# Patient Record
Sex: Female | Born: 1970
Health system: Southern US, Community
[De-identification: ages and names within clinical notes are randomized; demographics above are authoritative.]

## PROBLEM LIST (undated history)

## (undated) DIAGNOSIS — R51 Headache: Secondary | ICD-10-CM

## (undated) DIAGNOSIS — J302 Other seasonal allergic rhinitis: Secondary | ICD-10-CM

## (undated) DIAGNOSIS — K219 Gastro-esophageal reflux disease without esophagitis: Secondary | ICD-10-CM

## (undated) DIAGNOSIS — K649 Unspecified hemorrhoids: Secondary | ICD-10-CM

## (undated) DIAGNOSIS — T7840XA Allergy, unspecified, initial encounter: Secondary | ICD-10-CM

## (undated) DIAGNOSIS — E039 Hypothyroidism, unspecified: Secondary | ICD-10-CM

## (undated) DIAGNOSIS — N390 Urinary tract infection, site not specified: Secondary | ICD-10-CM

## (undated) DIAGNOSIS — E119 Type 2 diabetes mellitus without complications: Secondary | ICD-10-CM

## (undated) DIAGNOSIS — I10 Essential (primary) hypertension: Secondary | ICD-10-CM

## (undated) DIAGNOSIS — Z972 Presence of dental prosthetic device (complete) (partial): Secondary | ICD-10-CM

## (undated) HISTORY — DX: Allergy, unspecified, initial encounter: T78.40XA

## (undated) HISTORY — DX: Urinary tract infection, site not specified: N39.0

## (undated) HISTORY — PX: TONSILLECTOMY AND ADENOIDECTOMY: SUR1326

## (undated) HISTORY — DX: Type 2 diabetes mellitus without complications: E11.9

## (undated) HISTORY — PX: OTHER SURGICAL HISTORY: SHX169

---

## 2001-12-02 ENCOUNTER — Emergency Department (HOSPITAL_COMMUNITY): Admission: EM | Admit: 2001-12-02 | Discharge: 2001-12-02 | Payer: Self-pay | Admitting: Emergency Medicine

## 2001-12-05 ENCOUNTER — Encounter: Payer: Self-pay | Admitting: Family Medicine

## 2001-12-05 ENCOUNTER — Encounter: Admission: RE | Admit: 2001-12-05 | Discharge: 2001-12-05 | Payer: Self-pay | Admitting: Family Medicine

## 2001-12-18 ENCOUNTER — Encounter (INDEPENDENT_AMBULATORY_CARE_PROVIDER_SITE_OTHER): Payer: Self-pay

## 2001-12-18 ENCOUNTER — Inpatient Hospital Stay (HOSPITAL_COMMUNITY): Admission: RE | Admit: 2001-12-18 | Discharge: 2001-12-20 | Payer: Self-pay | Admitting: Obstetrics & Gynecology

## 2001-12-18 HISTORY — PX: MYOMECTOMY: SHX85

## 2002-12-31 ENCOUNTER — Ambulatory Visit (HOSPITAL_COMMUNITY): Admission: RE | Admit: 2002-12-31 | Discharge: 2002-12-31 | Payer: Self-pay | Admitting: *Deleted

## 2003-02-24 ENCOUNTER — Ambulatory Visit (HOSPITAL_COMMUNITY): Admission: RE | Admit: 2003-02-24 | Discharge: 2003-02-24 | Payer: Self-pay | Admitting: *Deleted

## 2003-03-02 ENCOUNTER — Ambulatory Visit (HOSPITAL_BASED_OUTPATIENT_CLINIC_OR_DEPARTMENT_OTHER): Admission: RE | Admit: 2003-03-02 | Discharge: 2003-03-02 | Payer: Self-pay | Admitting: Ophthalmology

## 2003-07-08 ENCOUNTER — Inpatient Hospital Stay (HOSPITAL_COMMUNITY): Admission: AD | Admit: 2003-07-08 | Discharge: 2003-07-11 | Payer: Self-pay | Admitting: Family Medicine

## 2003-07-08 ENCOUNTER — Encounter (INDEPENDENT_AMBULATORY_CARE_PROVIDER_SITE_OTHER): Payer: Self-pay

## 2003-07-08 HISTORY — PX: TUBAL LIGATION: SHX77

## 2003-08-20 ENCOUNTER — Encounter: Admission: RE | Admit: 2003-08-20 | Discharge: 2003-08-20 | Payer: Self-pay | Admitting: Obstetrics and Gynecology

## 2003-09-10 ENCOUNTER — Encounter: Admission: RE | Admit: 2003-09-10 | Discharge: 2003-09-10 | Payer: Self-pay | Admitting: Obstetrics and Gynecology

## 2004-01-24 ENCOUNTER — Emergency Department (HOSPITAL_COMMUNITY): Admission: AD | Admit: 2004-01-24 | Discharge: 2004-01-24 | Payer: Self-pay | Admitting: Family Medicine

## 2004-01-31 ENCOUNTER — Emergency Department (HOSPITAL_COMMUNITY): Admission: AD | Admit: 2004-01-31 | Discharge: 2004-01-31 | Payer: Self-pay | Admitting: Family Medicine

## 2004-02-06 ENCOUNTER — Emergency Department (HOSPITAL_COMMUNITY): Admission: AD | Admit: 2004-02-06 | Discharge: 2004-02-06 | Payer: Self-pay | Admitting: Internal Medicine

## 2005-02-07 HISTORY — PX: NASAL TURBINATE REDUCTION: SHX2072

## 2005-02-17 ENCOUNTER — Encounter (INDEPENDENT_AMBULATORY_CARE_PROVIDER_SITE_OTHER): Payer: Self-pay | Admitting: Specialist

## 2005-02-17 ENCOUNTER — Ambulatory Visit (HOSPITAL_BASED_OUTPATIENT_CLINIC_OR_DEPARTMENT_OTHER): Admission: RE | Admit: 2005-02-17 | Discharge: 2005-02-17 | Payer: Self-pay | Admitting: *Deleted

## 2005-02-17 ENCOUNTER — Ambulatory Visit (HOSPITAL_COMMUNITY): Admission: RE | Admit: 2005-02-17 | Discharge: 2005-02-17 | Payer: Self-pay | Admitting: *Deleted

## 2005-02-17 HISTORY — PX: ADENOIDECTOMY: SUR15

## 2005-03-22 ENCOUNTER — Other Ambulatory Visit: Admission: RE | Admit: 2005-03-22 | Discharge: 2005-03-22 | Payer: Self-pay | Admitting: Family Medicine

## 2005-04-01 ENCOUNTER — Emergency Department (HOSPITAL_COMMUNITY): Admission: EM | Admit: 2005-04-01 | Discharge: 2005-04-01 | Payer: Self-pay | Admitting: Emergency Medicine

## 2005-07-22 ENCOUNTER — Emergency Department (HOSPITAL_COMMUNITY): Admission: EM | Admit: 2005-07-22 | Discharge: 2005-07-22 | Payer: Self-pay | Admitting: Family Medicine

## 2005-08-11 ENCOUNTER — Encounter: Admission: RE | Admit: 2005-08-11 | Discharge: 2005-08-11 | Payer: Self-pay | Admitting: Gastroenterology

## 2005-11-29 ENCOUNTER — Emergency Department (HOSPITAL_COMMUNITY): Admission: EM | Admit: 2005-11-29 | Discharge: 2005-11-29 | Payer: Self-pay | Admitting: Emergency Medicine

## 2006-01-29 ENCOUNTER — Encounter: Admission: RE | Admit: 2006-01-29 | Discharge: 2006-04-29 | Payer: Self-pay | Admitting: Occupational Medicine

## 2006-09-15 ENCOUNTER — Emergency Department (HOSPITAL_COMMUNITY): Admission: EM | Admit: 2006-09-15 | Discharge: 2006-09-15 | Payer: Self-pay | Admitting: Family Medicine

## 2006-09-16 ENCOUNTER — Inpatient Hospital Stay (HOSPITAL_COMMUNITY): Admission: AD | Admit: 2006-09-16 | Discharge: 2006-09-16 | Payer: Self-pay | Admitting: Family Medicine

## 2006-10-29 ENCOUNTER — Ambulatory Visit (HOSPITAL_COMMUNITY): Admission: RE | Admit: 2006-10-29 | Discharge: 2006-10-30 | Payer: Self-pay | Admitting: Obstetrics & Gynecology

## 2006-10-29 ENCOUNTER — Encounter (INDEPENDENT_AMBULATORY_CARE_PROVIDER_SITE_OTHER): Payer: Self-pay | Admitting: *Deleted

## 2006-10-29 HISTORY — PX: LAPAROSCOPIC TOTAL HYSTERECTOMY: SUR800

## 2006-11-03 ENCOUNTER — Emergency Department (HOSPITAL_COMMUNITY): Admission: EM | Admit: 2006-11-03 | Discharge: 2006-11-03 | Payer: Self-pay | Admitting: Emergency Medicine

## 2007-05-31 ENCOUNTER — Emergency Department (HOSPITAL_COMMUNITY): Admission: EM | Admit: 2007-05-31 | Discharge: 2007-05-31 | Payer: Self-pay | Admitting: Family Medicine

## 2008-02-09 ENCOUNTER — Emergency Department (HOSPITAL_COMMUNITY): Admission: EM | Admit: 2008-02-09 | Discharge: 2008-02-09 | Payer: Self-pay | Admitting: Family Medicine

## 2008-10-10 ENCOUNTER — Emergency Department (HOSPITAL_COMMUNITY): Admission: EM | Admit: 2008-10-10 | Discharge: 2008-10-10 | Payer: Self-pay | Admitting: Emergency Medicine

## 2008-10-19 ENCOUNTER — Emergency Department (HOSPITAL_COMMUNITY): Admission: EM | Admit: 2008-10-19 | Discharge: 2008-10-19 | Payer: Self-pay | Admitting: Emergency Medicine

## 2009-08-24 ENCOUNTER — Emergency Department (HOSPITAL_BASED_OUTPATIENT_CLINIC_OR_DEPARTMENT_OTHER): Admission: EM | Admit: 2009-08-24 | Discharge: 2009-08-25 | Payer: Self-pay | Admitting: Emergency Medicine

## 2010-11-12 ENCOUNTER — Encounter: Payer: Self-pay | Admitting: Gastroenterology

## 2011-03-10 NOTE — Op Note (Signed)
[facility]  Patient:    Melanie Roberts, Melanie Roberts Visit Number: [id number] MRN: [id number]          Service Type: GYN Location: 9300 [id number] Attending Physician:  Genia Del Dictated by:   Genia Del, M.D. Proc. Date: 12/18/01 Admit Date:  12/18/2001                             Operative Report  DATE OF BIRTH:                May 05, 1971  PREOPERATIVE DIAGNOSES:       1. Symptomatic uterine myomas with left leg                                  numbness and pain.                               2. Menorrhagia.  POSTOPERATIVE DIAGNOSES:      1. Symptomatic uterine myomas with left leg                                  numbness and pain.                               2. Menorrhagia.  INTERVENTION:                 Myomectomy x 4.  SURGEON:                      Genia Del, M.D.  ASSISTANT:                    Lenoard Aden, M.D.  ANESTHESIOLOGIST:             Ellison Hughs., M.D.  DESCRIPTION OF PROCEDURE:     Under general anesthesia with endotracheal intubation with the patient in the lithotomy position, she was prepped with Betadine on the abdominal, suprapubic, vulvar and vaginal areas.  Vaginal exam revealed an anteverted uterus, irregular, with a large left, low uterine myoma measuring about 5-6 cm.  No adnexal mass.  Normal cervix.  A speculum was inserted.  A tenaculum was applied on the anterior lip of the cervix.  The uterus was cannulated and a syringe was joined to the catheter in order to be able to inject methylene blue, if necessary.  A bladder catheter was inserted. The patient was then repositioned in the dorsal decubitus position.  The patient was draped as usual.  A dose of Ancef, 1 g IV, was given.  A Pfannenstiel incision was made with a scalpel.  We then opened the aponeurosis transversely with electrocautery and Mayo scissors.  We then separated the rectus muscles from the aponeurosis in the  midline superiorly and inferiorly. The parietal peritoneum was opened longitudinally with Metzenbaum scissors. he bladder was retracted downward.  We then explored the abdominal cavity. The liver, kidneys and periaortic areas were normal.  No pathology was felt in the abdominal cavity.  In the pelvic cavity, the uterus was irregular, with four myomas noted.  The largest one originated in the junction between the lower uterine segment and the  cervix on the left lateral side, with a wide-based pedicle.  The total size of the irregular fibroid was about 6 cm. It occupied the lower aspect of the broad ligament and extended close to the bladder anteriorly.  The second largest myoma then was an intramural myoma on the anterior aspect of the uterus measuring about 2.5 cm.  Then, two small subserosal myomas were present in the fundal area of the uterus, one anteriorly and one posteriorly, both measuring about 1 cm in diameter.  Both tubes and ovaries were normal in appearance and size.  No other pelvic pathology was present.  A Balfour retractor was placed with a bladder blade. We then retracted the bowels upwards with three laps.  The left and right ureters were well visualized in normal anatomic position.  The left round ligament was doubly sutured with 0 Vicryl.  It was then sectioned in between with electrocautery.  The peritoneum was opened anteriorly and the bladder was retracted downward.  That maneuver exposed the left, wide-based, pedunculated myoma, measuring about 6 cm.  We used towel clamps to grasp the myoma and proceeded with dissection of the myoma with electrocautery and Vannas scissors.  We were situated anterior to the uterine blood vessels and superior to the ureter.  After dissecting the myoma completely and excising it partially to improve visualization, we obtained a reasonable-sized pedicle about 2 cm in diameter.  This was safely clamped with a curved Heaney very lose to the  myoma after assuring that the ureter and the uterine vessels were not included.  We then sectioned with Mayo scissors.  The remainder of the uterine myoma was sent for pathology.  We then sutured with a transfixion suture of 0 Vicryl.  Two X stitches were then necessary to complete hemostasis at the level of the bed of the uterine myoma.  This was done with 0 Vicryl. Hemostasis was adequate at that level.  We then continued with a second myomectomy for the intramural anterior myoma.  Pitressin was injected, 20 and 30, 10 cc.  We then opened the serosa and the myometrium with a vertical incision just above the myoma.  The myoma was dissected easily and a running locked suture was done with 0 Vicryl including the myometrium and the serosa. The incision was less than 3 cm in length and was shallow, not reaching the endometrium.  We then proceeded with the last two myomectomies at the level of the subserosal myomas anteriorly and posteriorly at the fundus.  Injection of Pitressin 1 cc at each location was done.  Electrocautery was used to open the serosa and reach the myomas.  The myomas were removed easily and 0 Vicryl was used in a locked suture to control hemostasis at that level.  After carefully verifying hemostasis at the level of the bladder flap and the previous site of the left pedunculated myoma, the peritoneum was closed with a running suture of 2-0 Vicryl.  The round ligament was then sutured back together with 0 Vicryl.  Irrigation and suction of the pelvic cavity was done.  Surgicel was applied to the anterior uterine incision.  Hemostasis was adequate at all levels.  The laps were removed as well as the Balfour retractor.  Hemostasis was completed at the level of the rectus muscles and aponeurosis with electrocautery.  The aponeurosis was closed with two half running sutures of 0 Vicryl.  Hemostasis was completed at the level of the adipose tissue with electrocautery.  The  subcutaneous tissue was  infiltrated with 0.25% Marcaine plain, 17 cc.  The skin was reapproximated with staples and a dry dressing was applied.  Sponge, needle and instrument counts were complete x 2.  Estimated  blood loss was 100 cc.  No complications occurred.  The patient was transferred to the recovery room in good status. Dictated by:   Genia Del, M.D. Attending Physician:  Genia Del DD:  12/18/01 TD:  12/18/01 Job: [id number]

## 2011-03-10 NOTE — Discharge Summary (Signed)
   NAME:  Melanie Roberts, Melanie Roberts                        ACCOUNT NO.:  [id number]   MEDICAL RECORD NO.:  [id number]                   PATIENT TYPE:  INP   LOCATION:  9134                                 FACILITY:  [facility]   PHYSICIAN:  Maurice March, M.D.                  DATE OF BIRTH:  01/19/1971   DATE OF ADMISSION:  07/08/2003  DATE OF DISCHARGE:  07/11/2003                                 DISCHARGE SUMMARY   ADMISSION DIAGNOSIS:  A 41 year old G3, P2-0-0-2 at term for scheduled low  transverse caesarian section secondary to history of myomectomies.   DISCHARGE DIAGNOSES:  53. A 40 year old G3, P3 postoperative day #3 status low transverse caesarian     section.  2. Viable female with Apgar's nine at 1 minute, nine at 5 minutes.  3. Bilateral tubal ligation.   DISCHARGE MEDICATIONS:  1. Ibuprofen 600 mg p.o. q.6h. p.r.n.  2. Percocet 5/325 mg 1-2 p.o. q.4-6h. p.r.n.  3. Prenatal vitamins 1 p.o. daily.  4. Iron sulfate 325 mg daily x6 weeks.   ADMISSION HISTORY:  Melanie Roberts was a 40 year old G3, P2 who presented at  term for a scheduled cesarean section.  She was taken to the operating room  by Dr. Okey Dupre.  Please see that dictation.  A bilateral tubal ligation was  also performed.   The patient had a routine postoperative course.  Her postoperative  hemoglobin was noted to be 8.7 and she was placed on iron for six weeks.  She is planning to bottle feed her infant.  He is to be circumcised prior to  discharge.   INSTRUCTIONS GIVEN TO PATIENT:  The patient was told of her medical regimen.  She was to follow up at [facility] in six weeks.   CONDITION ON DISCHARGE:  The patient was discharged to home in stable  condition.                                               Maurice March, M.D.    LC/MEDQ  D:  07/11/2003  T:  07/11/2003  Job:  [id number]

## 2011-03-10 NOTE — Discharge Summary (Signed)
[facility]  Patient:    Melanie Roberts, Melanie Roberts Visit Number: [id number] MRN: [id number]          Service Type: GYN Location: 9300 [id number] 01 Attending Physician:  Genia Del Dictated by:   Genia Del, M.D. Admit Date:  12/18/2001 Discharge Date: 12/20/2001                             Discharge Summary  ADMISSION DIAGNOSIS:          Symptomatic uterine myomas with left leg numbness and pain and menorrhagia.  DISCHARGE DIAGNOSIS:          Symptomatic uterine myomas with left leg numbness and pain and menorrhagia.  INTERVENTION:                 Myomectomies by laparotomy.  HOSPITAL COURSE:              The patient is a 40 year old woman.  She is G3, P2, A1 desiring to preserve her fertility potential as much as possible.  She had a pelvic ultrasound showing four uterine myomas with one pediculated left inferior myoma, probably responsible for her left leg numbness and pain.  She also had problems with menorrhagia but no anemia with a hemoglobin preop at 12.6.  The patient was on hydrocodone and ibuprofen for the above-mentioned pains.  She has no known drug allergies.  No past medical or surgical history. Her physical exam was within normal limits with stable vital signs.  Her uterus was anteverted, irregular, increased in volume corresponding to 10-12 cm with a left low myoma or mass measuring 5-6 cm.  The patient was brought to the OR on December 18, 2001.  Four myomectomies were performed. The largest myoma was very low on the left aspect of the uterus, coming off of the junction between the uterus and cervix.  Three other myomas were removed. One was anterior intramural and two were subserosal at the anterior and posterior fundus.  Pitressin was used to decrease blood loss.  Hemostasis was good at the end of the intervention.  Estimated blood loss was 100 cc.  No complications occurred.  The postop course was unremarkable.  The  patient remained hemodynamically stable and afebrile.  Her hemoglobin postop was 11. She was discharged on postop day #2.  Postop advice was given.  She was prescribed Tylox and ibuprofen p.r.n.  She will follow up at [facility] in 3-4 weeks for a postop evaluation. Dictated by:   Genia Del, M.D. Attending Physician:  Genia Del DD:  01/02/02 TD:  01/04/02 Job: [id number]

## 2011-03-10 NOTE — Op Note (Signed)
NAME:  Melanie Roberts, Melanie Roberts                        ACCOUNT NO.:  [id number]   MEDICAL RECORD NO.:  [id number]                   PATIENT TYPE:  INP   LOCATION:  9134                                 FACILITY:  [facility]   PHYSICIAN:  Phil D. Okey Dupre, M.D.                  DATE OF BIRTH:  1971-07-01   DATE OF PROCEDURE:  07/08/2003  DATE OF DISCHARGE:                                 OPERATIVE REPORT   PREOPERATIVE DIAGNOSES:  1. Term pregnancy post multiple myomectomy for uterine fibroids.  2. Voluntary sterilization.   POSTOPERATIVE DIAGNOSES:  1. Term pregnancy post multiple myomectomy for uterine fibroids.  2. Voluntary sterilization.   PROCEDURES:  1. Low transverse cesarean section.  2. Modified Pomeroy bilateral tubal ligation.   ANESTHESIA:  Spinal.   ESTIMATED BLOOD LOSS:  700 mL.   SURGEON:  Javier Glazier. Okey Dupre, M.D.   POSTOPERATIVE CONDITION:  Satisfactory.   OPERATIVE FINDINGS:  A female infant, 7 pounds 4 ounces, with Apgar 9 and 9.   The procedure went as follows:  Under satisfactory spinal anesthesia with  the patient in the dorsal supine position, a Foley catheter having been  placed in the urinary bladder, the abdomen was prepped and draped in the  usual sterile manner.  There was a large transverse keloid scar from a  previous myomectomy that was removed on entry and the low transverse  incision was extended to a total length of 20 cm with it being situated 3 cm  above the symphysis.  From that point on the abdomen was entered by layers  and on entering the peritoneal cavity, the visceral peritoneum and the  anterior surface of the uterus was opened transversely by sharp dissection,  the bladder pushed away from the lower uterine segment, which was entered by  sharp and blunt dissection, and from an LOT presentation using a Simpson  forceps, the baby was easily delivered.  The cord doubly clamped and  divided, the baby handed to the pediatrician.  It was a female infant  weighing  7 pounds 4 ounces, and 9 and 9 Apgar.  Specimens of blood were taken for  analysis, the spontaneous, removed, and the uterus explored.  The uterus was  then closed with continuous running locked 0 Vicryl suture on an atraumatic  needle.  Once this was done there was one area of oozing along the suture  line, which was controlled with the same suture of a figure-of-eight single  suture.  At this point the tubes were identified, grasped with Babcock  clamp, an opening made through an avascular portion of the mesosalpinx  beneath the tube using a hemostat and one plain suture brought through this  opening and tied around the distal and proximal ends of the tube, forming a  loop of tube approximately 2 cm above the tie.  The second tie was placed  before the aforementioned tie, and this  was tied and cut short and the  section of tube above the tie was excised with a Metzenbaum scissors.  The  exposed portions of the tube after the dissection were coagulated with hot  cautery.  The area was then re-observed for bleeding, none was noted, and  the fascia was closed with continuous alternating locked 0 Vicryl suture  from either end of the incision, meeting in the midline.  Hot cautery was  used for subcutaneous bleeding control and skin staples used for skin edge  approximation.  A dry sterile dressing was applied.  The patient tolerated  the procedure well.  Tape, instrument, sponge, and needle count were  reported correct at the end of the procedure and the Foley catheter was  draining clear amber urine.  The placenta was sent for pathologic diagnosis.                                               Phil D. Okey Dupre, M.D.    PDR/MEDQ  D:  07/08/2003  T:  07/08/2003  Job:  [id number]

## 2011-03-10 NOTE — Op Note (Signed)
NAME:  Melanie Roberts, Melanie Roberts      ACCOUNT NO.:  [id number]   MEDICAL RECORD NO.:  [id number]          PATIENT TYPE:  AMB   LOCATION:  DAY                          FACILITY:  [facility]   PHYSICIAN:  Genia Del, M.D.DATE OF BIRTH:  02/20/1971   DATE OF PROCEDURE:  10/29/2006  DATE OF DISCHARGE:                               OPERATIVE REPORT   PREOPERATIVE DIAGNOSIS:  Myomas with menorrhagia.   POSTOPERATIVE DIAGNOSIS:  Myomas with menorrhagia.   PROCEDURE:  Total laparoscopic hysterectomy, assisted with Da vinci  robot.   SURGEON:  Dr. Genia Del.   ASSISTANT:  Dr. Richardean Sale.   ANESTHESIOLOGIST:  Jill Side, M.D.   PROCEDURE:  Under general anesthesia with endotracheal intubation the  patient is in lithotomy position.  She is prepped with Betadine on the  abdominal, suprapubic, vulvar and vaginal areas and draped as usual.  The vaginal exam reveals an anteverted uterus, irregular, about 12 cm,  mobile, no adnexal mass.  The speculum is introduced in the vagina.  The  anterior lip of the cervix was grasped with a tenaculum, hysterometry is  at 12 cm.  We therefore used a #10 Rumi.  We put the Rumi with the [facility]  ring in place.  We attached the Koh ring to the cervix with a Vicryl 0.  The speculum was removed.  The bladder catheter is inserted.  We then  make the measurements.  The camera port is put at 18 cm from the  symphysis pubis about 8-10 cm above the fundus of the uterus.  We marked  the skin there.  We infiltrate the subcutaneous tissue with Marcaine one-  quarter plain.  We made a 1.5 cm incision with a scalpel at that level.  We opened the aponeurosis with Mayo scissors under direct vision and we  opened the parietal peritoneum with Met scissors under direct vision.  A  pursestring suture of Vicryl 0 is put on the aponeurosis.  We then  insert the Hasson and the camera at that level.  We create a  pneumoperitoneum.  We visualized the  intra-abdominal and pelvic  cavities.  The abdominal cavity appears within normal limits.  The  pelvic cavity presents an enlarged uterus with multiple myomas.  Both  ovaries are normal in appearance and volume.  No other lesion is seen in  the pelvis.  We then measure all other ports and put three robotic ports  and one assistant port in the usual fashion.  We then docked the robot  and inserted the instruments.  A fenestrated bipolar is used on the left  hand and Endo shears scissors used on the right hand and in the fourth  arm the Cobra clamp is used to we then start robotic time.  We cauterize  and section successively the left tube, left utero-ovarian ligament and  then the left round ligament and descent towards the left uterine  artery.  A myoma is present on the lateral border of the uterus that  makes the surgery more difficult but no problem occurs.  We proceed the  same way on the right side.  There too a lateral fibroid  is present.  We  then opened the visceral peritoneum over the lower uterine segment with  the Endo shear scissors.  The bladder is very adherent to the lower  uterine segment because of previous C-section.  We very carefully  dissect that area to lower the bladder.  The bladder catheter is clamped  twice to fill up the bladder to see the border of it more definitely.  We successfully reclined the bladder.  We then cauterized and section  the uterine artery on both sides and finally open the vaginal vault with  the Endo shears scissor point on the Koh ring.  The uterus is completely  freed that way and it is removed vaginally and sent to pathology.  We  then inflated the at the vaginal obturator to preserve the  pneumoperitoneum and close the vaginal vault with Vicryl 0-0 in figure-  of-eight stitches.  We then irrigate and suction the pelvic cavity.  Hemostasis is adequate at all levels.  We then removed all instruments.  The robot is undocked and the ports are  removed.  At the supraumbilical  incision, we attached the Vicryl zero on the aponeurosis.  We then make  a subcuticular stitch of Vicryl 4-0.  We closed the skin with a Vicryl 4-  0 at the assistant port as well and we closed the three other robotic  incisions with Dermabond on the skin.  The obturator is removed  vaginally.  The count of instruments and sponges was complete. The  estimated blood loss was 125 mL.  The patient received Ancef 4 grams IV  at the beginning of the surgery.  She was brought to recovery room in  good stable status.      Genia Del, M.D.  Electronically Signed     ML/MEDQ  D:  10/29/2006  T:  10/29/2006  Job:  [id number]

## 2011-03-10 NOTE — Op Note (Signed)
NAMEMAYSON, STERBENZ NO.:  [id number]   MEDICAL RECORD NO.:  [id number]          PATIENT TYPE:  AMB   LOCATION:  DSC                          FACILITY:  [facility]   PHYSICIAN:  Kathy Breach, M.D.      DATE OF BIRTH:  1971-01-13   DATE OF PROCEDURE:  02/17/2005  DATE OF DISCHARGE:                                 OPERATIVE REPORT   PREOPERATIVE DIAGNOSES:  1.  Obstructive hyperplastic adenoids.  2.  Bilateral hyperplasia inferior nasal turbinates.   POSTOPERATIVE DIAGNOSES:  1.  Obstructive hyperplastic adenoids.  2.  Bilateral hyperplasia inferior nasal turbinates.   PROCEDURES:  1.  Adenoidectomy.  2.  Bilateral submucous resection inferior turbinates.   DESCRIPTION OF PROCEDURE:  With patient under general orotracheal  anesthesia, Crowe-Davis mouth gag inserted and patient put in rose position.  Soft palate was intact with normal configuration.  Tonsils were surgically  absent.  Red rubber catheter was passed through the left nasal chamber and  used to elevate the soft palate.  Mirror visualization revealed large fronds  of adenoid tissue at and into the posterior choanae bilaterally. Evidence of  previous adenoidectomy in childhood, removed remaining adenoid tissue more  inferiorly.  Under mirror visualization with suction cautery with the  adenoid tissue out of reach of curettes or adenotomes, under mirror  visualization with suction cautery, complete ablation of remaining adenoid  tissue with removal was completed.  Blood loss was minimal with removal in  this fashion.   Patient then repositioned.  Nasal block anesthesia applied with 4% Xylocaine  with ephedrine solution on olive-tipped probes to the sphenopalatine.  Anterior ethmoid __________ bilaterally.  Cotton pledgets soaked with  similar solution inserted along both inferior turbinates and inferior  turbinates were infiltrated with 1% Xylocaine with 1:100,000 epinephrine for  vasoconstrictive  effort.  Nasal inspection before block applied hyperplastic  turbinates right greater than left nearly obstructing inferior nasal areas.  The left turbinate shrunk down fairly nicely with decongesting the right one  minimally.  Stab incisions made over the anterior aspect of the left  inferior turbinate.  Superiorly based septal surface mucosa was elevated  submucosally off the turbinate bone.  Lower half of the turbinate bone with  attached inferior meatal mucosa was excised with angled scissors.  With  suction cautery, complete hemostasis along the bony mucosal resection lines  was completed as well as reduction of the posterior extension of the  turbinate.  Remaining turbinate bone was gently out-fractured.  Similar  procedure completed on the right side.  Blood loss was insignificant  throughout the entire combined procedures.  The patient tolerated the  procedure well and was taken to the recovery room in stable general  condition.    JGL/MEDQ  D:  02/17/2005  T:  02/17/2005  Job:  [id number]

## 2011-05-31 ENCOUNTER — Emergency Department (HOSPITAL_COMMUNITY): Payer: Federal, State, Local not specified - PPO

## 2011-05-31 ENCOUNTER — Emergency Department (HOSPITAL_COMMUNITY)
Admission: EM | Admit: 2011-05-31 | Discharge: 2011-06-01 | Disposition: A | Discharge status: Home / Self Care | Payer: Federal, State, Local not specified - PPO | Attending: Emergency Medicine | Admitting: Emergency Medicine

## 2011-05-31 ENCOUNTER — Inpatient Hospital Stay (INDEPENDENT_AMBULATORY_CARE_PROVIDER_SITE_OTHER)
Admission: RE | Admit: 2011-05-31 | Discharge: 2011-05-31 | Disposition: A | Discharge status: Home / Self Care | Payer: Federal, State, Local not specified - PPO | Source: Ambulatory Visit | Attending: Family Medicine | Admitting: Family Medicine

## 2011-05-31 DIAGNOSIS — E039 Hypothyroidism, unspecified: Secondary | ICD-10-CM | POA: Insufficient documentation

## 2011-05-31 DIAGNOSIS — G510 Bell's palsy: Secondary | ICD-10-CM

## 2011-05-31 DIAGNOSIS — R209 Unspecified disturbances of skin sensation: Secondary | ICD-10-CM | POA: Insufficient documentation

## 2011-05-31 DIAGNOSIS — Z79899 Other long term (current) drug therapy: Secondary | ICD-10-CM | POA: Insufficient documentation

## 2011-05-31 DIAGNOSIS — H109 Unspecified conjunctivitis: Secondary | ICD-10-CM | POA: Insufficient documentation

## 2011-05-31 LAB — DIFFERENTIAL
Eosinophils Absolute: 0.1 10*3/uL (ref 0.0–0.7)
Eosinophils Relative: 3 % (ref 0–5)
Monocytes Absolute: 0.3 10*3/uL (ref 0.1–1.0)
Neutrophils Relative %: 43 % (ref 43–77)

## 2011-05-31 LAB — BASIC METABOLIC PANEL
BUN: 10 mg/dL (ref 6–23)
CO2: 26 mEq/L (ref 19–32)
Chloride: 102 mEq/L (ref 96–112)
Creatinine, Ser: 0.58 mg/dL (ref 0.50–1.10)
GFR calc non Af Amer: >60 mL/min (ref >60)
Potassium: 3.6 mEq/L (ref 3.5–5.1)
Sodium: 138 mEq/L (ref 135–145)

## 2011-05-31 LAB — CBC
HCT: 36.1 % (ref 36.0–46.0)
Hemoglobin: 12.3 g/dL (ref 12.0–15.0)
MCHC: 34.1 g/dL (ref 30.0–36.0)
Platelets: 296 10*3/uL (ref 150–400)

## 2011-07-28 LAB — URINALYSIS, ROUTINE W REFLEX MICROSCOPIC
Bilirubin Urine: NEGATIVE
Glucose, UA: NEGATIVE mg/dL
Hgb urine dipstick: NEGATIVE
Ketones, ur: NEGATIVE mg/dL
Nitrite: NEGATIVE
Protein, ur: NEGATIVE mg/dL
Specific Gravity, Urine: 1.016 (ref 1.005–1.030)

## 2011-07-28 LAB — CBC
HCT: 36.3 % (ref 36.0–46.0)
Hemoglobin: 12 g/dL (ref 12.0–15.0)
MCHC: 33.2 g/dL (ref 30.0–36.0)
MCV: 88 fL (ref 78.0–100.0)
Platelets: 260 10*3/uL (ref 150–400)
RBC: 4.12 MIL/uL (ref 3.87–5.11)
RDW: 14.3 % (ref 11.5–15.5)
WBC: 5.4 10*3/uL (ref 4.0–10.5)

## 2011-07-28 LAB — BASIC METABOLIC PANEL
Calcium: 8.7 mg/dL (ref 8.4–10.5)
Chloride: 105 mEq/L (ref 96–112)

## 2011-07-28 LAB — DIFFERENTIAL
Lymphocytes Relative: 29 % (ref 12–46)
Neutro Abs: 3.4 10*3/uL (ref 1.7–7.7)

## 2012-01-03 ENCOUNTER — Emergency Department (INDEPENDENT_AMBULATORY_CARE_PROVIDER_SITE_OTHER)
Admission: EM | Admit: 2012-01-03 | Discharge: 2012-01-03 | Disposition: A | Discharge status: Home / Self Care | Payer: Federal, State, Local not specified - PPO | Source: Home / Self Care | Attending: Family Medicine | Admitting: Family Medicine

## 2012-01-03 ENCOUNTER — Encounter (HOSPITAL_COMMUNITY): Payer: Self-pay

## 2012-01-03 DIAGNOSIS — J069 Acute upper respiratory infection, unspecified: Secondary | ICD-10-CM

## 2012-01-03 MED ORDER — AZITHROMYCIN 250 MG PO TABS: 250.0000 mg | ORAL_TABLET | Freq: Every day | ORAL | Status: AC

## 2012-01-03 MED ORDER — HYDROCODONE-ACETAMINOPHEN 7.5-500 MG/15ML PO SOLN: 15.0000 mL | Freq: Four times a day (QID) | ORAL | Status: AC | PRN

## 2012-01-03 MED ORDER — IBUPROFEN 600 MG PO TABS: 600.0000 mg | ORAL_TABLET | Freq: Three times a day (TID) | ORAL | Status: AC | PRN

## 2012-01-03 MED ORDER — FLUTICASONE PROPIONATE 50 MCG/ACT NA SUSP: 2.0000 | Freq: Every day | NASAL | Status: DC

## 2012-01-03 NOTE — Discharge Instructions (Signed)
Is likely you have the Flu. Is very important top keep well hydrated. Take the prescribed medications as instructed. Take ibuprofen scheduled for the next 24-48 hours take with food and plenty of liquids as it can upset your stomach, can take over the counter prilosec while taking ibuprofen. Use nasal saline spray at least 3 times a day. (simply saline is over the counter) Start the prescribed antibiotic only if no improvement of your symptoms after 48 hours. Return if difficulty breathing, chest pain or not keeping fluids down.

## 2012-01-03 NOTE — ED Notes (Signed)
Not sure if she is having menopause or flu symptoms; has had nausea, dizziness, food sits in her stomach, , fever, HA for 1 week; had hysterectomy , but left ovaries in place ; NAD

## 2012-01-03 NOTE — ED Provider Notes (Signed)
History     CSN: [id number]  Arrival date & time 01/03/12  1704   First MD Initiated Contact with Patient 01/03/12 1736      Chief Complaint  Patient presents with  . Influenza    (Consider location/radiation/quality/duration/timing/severity/associated sxs/prior treatment) HPI Comments: 41 y/o female with h/o hypothyroidism here c/o flu like symptoms, headache, sinus and nasal congestion, productive cough and general malaise for about 1 week. Also reports intermitent hot flashes and dizziness during last month. She thinks she is going through menopause as she still has her ovaries despite having had a hysterectomy in the past. Reports compliance taking usual dose of synthroid has a follow up with endocrinologist. Had subjective fever inicially but denies fever or chills at least for the last 3 days. No fever or pain medications today. Pt afebrile here. No vomiting or diarrhea.   Past Medical History  Diagnosis Date  . Thyroid disease     History reviewed. No pertinent past surgical history.  History reviewed. No pertinent family history.  History  Substance Use Topics  . Smoking status: Not on file  . Smokeless tobacco: Not on file  . Alcohol Use:     OB History    Grav Para Term Preterm Abortions TAB SAB Ect Mult Living                  Review of Systems  Constitutional: Negative for fever and chills.  HENT: Positive for congestion and sinus pressure. Negative for sore throat.   Respiratory: Positive for cough. Negative for chest tightness and shortness of breath.   Cardiovascular: Negative for chest pain, palpitations and leg swelling.  Gastrointestinal: Negative for nausea, vomiting, abdominal pain and diarrhea.  Genitourinary: Negative for dysuria, frequency and flank pain.  Skin: Negative for rash.  Neurological: Positive for dizziness and headaches.    Allergies  Review of patient's allergies indicates not on file.  Home Medications   Current Outpatient  Rx  Name Route Sig Dispense Refill  . LEVOTHYROXINE SODIUM 137 MCG PO TABS Oral Take 137 mcg by mouth daily.    . AZITHROMYCIN 250 MG PO TABS Oral Take 1 tablet (250 mg total) by mouth daily. Take first 2 tablets together, then 1 every day until finished. 6 tablet 0    Hold prescription and fill if persistent fever and ...  . FLUTICASONE PROPIONATE 50 MCG/ACT NA SUSP Nasal Place 2 sprays into the nose daily. Apply to each nostril 16 g 0  . HYDROCODONE-ACETAMINOPHEN 7.5-500 MG/15ML PO SOLN Oral Take 15 mLs by mouth every 6 (six) hours as needed for pain or cough. 120 mL 0  . IBUPROFEN 600 MG PO TABS Oral Take 1 tablet (600 mg total) by mouth every 8 (eight) hours as needed for pain or fever. 20 tablet 0    BP 119/81  Pulse 80  Temp(Src) 99 F (37.2 C) (Oral)  Resp 18  SpO2 99%  Physical Exam  Nursing note and vitals reviewed. Constitutional: She is oriented to person, place, and time. She appears well-developed and well-nourished. No distress.  HENT:  Head: Normocephalic and atraumatic.       Nasal Congestion with erythema and swelling of nasal turbinates, clear rhinorrhea. Pharyngeal erythema no exudates. No uvula deviation. No trismus. TM's normal  Eyes: Conjunctivae are normal. Pupils are equal, round, and reactive to light.  Neck: Normal range of motion. Neck supple.  Cardiovascular: Normal rate, regular rhythm and normal heart sounds.   Pulmonary/Chest: Breath sounds normal. No respiratory  distress. She has no wheezes. She has no rales. She exhibits no tenderness.  Abdominal: Soft. Bowel sounds are normal. She exhibits no distension. There is no tenderness.  Lymphadenopathy:    She has no cervical adenopathy.  Neurological: She is alert and oriented to person, place, and time.  Skin: No rash noted.    ED Course  Procedures (including critical care time)  Labs Reviewed - No data to display No results found.   1. URI (upper respiratory infection)       MDM  Impress  viral rhino sinusitis with 1 week evolution. Treated symptomatically a hold prescription for azithromycin was given in case or recurrent fever, worsening or persistent symptoms in the next following days.         Sharin Grave, MD 01/05/12 1028

## 2012-12-19 ENCOUNTER — Encounter (INDEPENDENT_AMBULATORY_CARE_PROVIDER_SITE_OTHER): Payer: Self-pay

## 2012-12-20 ENCOUNTER — Ambulatory Visit (INDEPENDENT_AMBULATORY_CARE_PROVIDER_SITE_OTHER): Payer: Federal, State, Local not specified - PPO | Admitting: General Surgery

## 2012-12-20 ENCOUNTER — Encounter (INDEPENDENT_AMBULATORY_CARE_PROVIDER_SITE_OTHER): Payer: Self-pay | Admitting: General Surgery

## 2012-12-20 VITALS — BP 120/82 | HR 80 | Temp 98.0°F | Ht 67.0 in | Wt 215.0 lb

## 2012-12-20 DIAGNOSIS — K649 Unspecified hemorrhoids: Secondary | ICD-10-CM

## 2012-12-20 NOTE — Progress Notes (Signed)
Patient ID: Melanie Roberts, female   DOB: 1971-04-09, 42 y.o.   MRN: [id number]  Chief Complaint  Patient presents with  . New Evaluation    Eval Hemorrhoids    HPI Melanie Roberts is a 42 y.o. female.  This patient is referred by Dr. Leanor Kail for evaluation of hemorrhoids. She said that she has had hemorrhoids for several years since the birth of her children but she has put up with the for many years. She says it bothers her daily causing some problems such as difficulty with hygiene and bleeding with bowel movements and spontaneous prolapse. She says that she does have some bright red bleeding mainly on the right which is the side of of most of her symptoms. She says that she does have prolapsed hemorrhoids mainly on the right to the size of a grape that these will reduce spontaneously. She says it is caused some discomfort with sitting at work. She does say that her bowels are normal moving her bowels about every day or every other day. She does take stool softeners daily which he has done for several years she is not on any routine fiber supplements. She says that she has modify her diet to try to accommodate increase fiber in she does move her bowels normally and no soft bowel movements but despite this she continues to have discomfort and bleeding and symptoms. HPI  Past Medical History  Diagnosis Date  . Thyroid disease   . Allergy     Past Surgical History  Procedure Laterality Date  . Abdominal hysterectomy      No family history on file.  Social History History  Substance Use Topics  . Smoking status: Never Smoker   . Smokeless tobacco: Not on file  . Alcohol Use: Yes    No Known Allergies  Current Outpatient Prescriptions  Medication Sig Dispense Refill  . fluticasone (FLONASE) 50 MCG/ACT nasal spray Place 2 sprays into the nose daily. Apply to each nostril  16 g  0  . levothyroxine (SYNTHROID, LEVOTHROID) 137 MCG tablet Take 137 mcg by mouth daily.        No current facility-administered medications for this visit.    Review of Systems Review of Systems All other review of systems negative or noncontributory except as stated in the HPI  There were no vitals taken for this visit.  Physical Exam Physical Exam Physical Exam  Nursing note and vitals reviewed. Constitutional: She is oriented to person, place, and time. She appears well-developed and well-nourished. No distress.  HENT:  Head: Normocephalic and atraumatic.  Mouth/Throat: No oropharyngeal exudate.  Eyes: Conjunctivae and EOM are normal. Pupils are equal, round, and reactive to light. Right eye exhibits no discharge. Left eye exhibits no discharge. No scleral icterus.  Neck: Normal range of motion. Neck supple. No tracheal deviation present.  Cardiovascular: Normal rate, regular rhythm, normal heart sounds and intact distal pulses.   Pulmonary/Chest: Effort normal and breath sounds normal. No stridor. No respiratory distress. She has no wheezes.  Abdominal: Soft. Bowel sounds are normal. She exhibits no distension and no mass. There is no tenderness. There is no rebound and no guarding.  Musculoskeletal: Normal range of motion. She exhibits no edema and no tenderness.  Neurological: She is alert and oriented to person, place, and time.  Skin: Skin is warm and dry. No rash noted. She is not diaphoretic. No erythema. No pallor.  Psychiatric: She has a normal mood and affect. Her behavior is normal. Judgment and thought  content normal.  Rectal: She has some external skin circumferentially but no obvious significant external hemorrhoids. Anoscopic exam was performed which demonstrated some internal hemorrhoidal tissue without evidence of bleeding or any masses. The visible disease did not appear that significant on exam.  Data Reviewed   Assessment    Internal hemorrhoids with bleeding and discomfort She has a story which is pretty good for symptomatic hemorrhoids. It sounds  as though her symptoms are more significant on the right. She has tried stool softeners and dietary modifications and no this has improved her bowel movements and bowel frequency, this has not relieved her symptoms from her hemorrhoids. She says that she suffered from  this for many years and is interested in surgical therapy. I had a long discussion with her regarding the surgical options including hemorrhoidectomy or banding, and as well as the pros and cons and risks and benefits.  We discussed the risks of infection, bleeding, pain, scarring, stricture, incontinence to stool or gas, and recurrence and persistent pain and she expressed understanding and would like to proceed with rectal exam under anesthesia with possible hemorrhoidectomy and/or banding.    Plan    We will set her up for rectal exam under anesthesia with possible hemorrhoidectomy or banding        Kaida Games DAVID 12/20/2012, 11:11 AM

## 2012-12-21 DIAGNOSIS — K649 Unspecified hemorrhoids: Secondary | ICD-10-CM

## 2012-12-21 HISTORY — DX: Unspecified hemorrhoids: K64.9

## 2012-12-25 ENCOUNTER — Encounter (HOSPITAL_BASED_OUTPATIENT_CLINIC_OR_DEPARTMENT_OTHER): Payer: Self-pay | Admitting: *Deleted

## 2012-12-25 NOTE — Progress Notes (Signed)
No labs needed

## 2012-12-30 ENCOUNTER — Ambulatory Visit (HOSPITAL_BASED_OUTPATIENT_CLINIC_OR_DEPARTMENT_OTHER)
Admission: RE | Admit: 2012-12-30 | Payer: Federal, State, Local not specified - PPO | Source: Ambulatory Visit | Admitting: General Surgery

## 2012-12-30 ENCOUNTER — Encounter (HOSPITAL_BASED_OUTPATIENT_CLINIC_OR_DEPARTMENT_OTHER): Payer: Self-pay | Admitting: Anesthesiology

## 2012-12-30 HISTORY — DX: Hypothyroidism, unspecified: E03.9

## 2012-12-30 HISTORY — DX: Other seasonal allergic rhinitis: J30.2

## 2012-12-30 HISTORY — DX: Presence of dental prosthetic device (complete) (partial): Z97.2

## 2012-12-30 SURGERY — EXAM UNDER ANESTHESIA WITH HEMORRHOIDECTOMY
Anesthesia: General

## 2012-12-30 NOTE — Anesthesia Preprocedure Evaluation (Deleted)
Anesthesia Evaluation  Patient identified by MRN, date of birth, ID band Patient awake    Reviewed: Allergy & Precautions, H&P , NPO status , Patient's Chart, lab work & pertinent test results  Airway Mallampati: II TM Distance: >3 FB Neck ROM: Full    Dental   Pulmonary  breath sounds clear to auscultation        Cardiovascular Rhythm:Regular Rate:Normal     Neuro/Psych    GI/Hepatic   Endo/Other    Renal/GU      Musculoskeletal   Abdominal (+) + obese,   Peds  Hematology   Anesthesia Other Findings   Reproductive/Obstetrics                           Anesthesia Physical Anesthesia Plan  ASA: II  Anesthesia Plan: General   Post-op Pain Management:    Induction: Intravenous  Airway Management Planned: Oral ETT  Additional Equipment:   Intra-op Plan:   Post-operative Plan: Extubation in OR  Informed Consent: I have reviewed the patients History and Physical, chart, labs and discussed the procedure including the risks, benefits and alternatives for the proposed anesthesia with the patient or authorized representative who has indicated his/her understanding and acceptance.     Plan Discussed with: CRNA and Surgeon  Anesthesia Plan Comments: (prone)       Anesthesia Quick Evaluation

## 2013-01-07 ENCOUNTER — Encounter (HOSPITAL_BASED_OUTPATIENT_CLINIC_OR_DEPARTMENT_OTHER): Payer: Self-pay | Admitting: *Deleted

## 2013-01-14 ENCOUNTER — Ambulatory Visit (HOSPITAL_BASED_OUTPATIENT_CLINIC_OR_DEPARTMENT_OTHER): Payer: Federal, State, Local not specified - PPO | Admitting: *Deleted

## 2013-01-14 ENCOUNTER — Ambulatory Visit (HOSPITAL_BASED_OUTPATIENT_CLINIC_OR_DEPARTMENT_OTHER)
Admission: RE | Admit: 2013-01-14 | Discharge: 2013-01-14 | Disposition: A | Discharge status: Home / Self Care | Payer: Federal, State, Local not specified - PPO | Source: Ambulatory Visit | Attending: General Surgery | Admitting: General Surgery

## 2013-01-14 ENCOUNTER — Encounter (HOSPITAL_BASED_OUTPATIENT_CLINIC_OR_DEPARTMENT_OTHER)
Admission: RE | Disposition: A | Discharge status: Home / Self Care | Payer: Self-pay | Source: Ambulatory Visit | Attending: General Surgery

## 2013-01-14 ENCOUNTER — Encounter (HOSPITAL_BASED_OUTPATIENT_CLINIC_OR_DEPARTMENT_OTHER): Payer: Self-pay | Admitting: *Deleted

## 2013-01-14 ENCOUNTER — Encounter (HOSPITAL_BASED_OUTPATIENT_CLINIC_OR_DEPARTMENT_OTHER): Payer: Self-pay

## 2013-01-14 DIAGNOSIS — K644 Residual hemorrhoidal skin tags: Secondary | ICD-10-CM | POA: Insufficient documentation

## 2013-01-14 DIAGNOSIS — K648 Other hemorrhoids: Secondary | ICD-10-CM | POA: Insufficient documentation

## 2013-01-14 DIAGNOSIS — K649 Unspecified hemorrhoids: Secondary | ICD-10-CM

## 2013-01-14 HISTORY — DX: Unspecified hemorrhoids: K64.9

## 2013-01-14 HISTORY — DX: Headache: R51

## 2013-01-14 HISTORY — PX: EVALUATION UNDER ANESTHESIA WITH HEMORRHOIDECTOMY: SHX5624

## 2013-01-14 SURGERY — EXAM UNDER ANESTHESIA WITH HEMORRHOIDECTOMY
Anesthesia: General | Wound class: Contaminated

## 2013-01-14 MED ORDER — DEXAMETHASONE SODIUM PHOSPHATE 4 MG/ML IJ SOLN
INTRAMUSCULAR | Status: DC | PRN
  Administered 2013-01-14: 10 mg via INTRAVENOUS

## 2013-01-14 MED ORDER — DIBUCAINE 1 % RE OINT
TOPICAL_OINTMENT | RECTAL | Status: DC | PRN
  Administered 2013-01-14: 1 via RECTAL

## 2013-01-14 MED ORDER — MIDAZOLAM HCL 2 MG/2ML IJ SOLN: 1.0000 mg | INTRAMUSCULAR | Status: DC | PRN

## 2013-01-14 MED ORDER — LIDOCAINE HCL (CARDIAC) 20 MG/ML IV SOLN
INTRAVENOUS | Status: DC | PRN
  Administered 2013-01-14: 60 mg via INTRAVENOUS

## 2013-01-14 MED ORDER — OXYCODONE HCL 5 MG PO TABS: 5.0000 mg | ORAL_TABLET | ORAL | Status: DC | PRN

## 2013-01-14 MED ORDER — DOCUSATE SODIUM 100 MG PO CAPS: 100.0000 mg | ORAL_CAPSULE | Freq: Two times a day (BID) | ORAL | Status: DC

## 2013-01-14 MED ORDER — LIDOCAINE-EPINEPHRINE 1 %-1:100000 IJ SOLN
INTRAMUSCULAR | Status: DC | PRN
  Administered 2013-01-14 (×2): 20 mL

## 2013-01-14 MED ORDER — ONDANSETRON HCL 4 MG/2ML IJ SOLN: 4.0000 mg | Freq: Once | INTRAMUSCULAR | Status: DC | PRN

## 2013-01-14 MED ORDER — LACTATED RINGERS IV SOLN: INTRAVENOUS | Status: DC

## 2013-01-14 MED ORDER — ONDANSETRON HCL 4 MG/2ML IJ SOLN
INTRAMUSCULAR | Status: DC | PRN
  Administered 2013-01-14: 4 mg via INTRAVENOUS

## 2013-01-14 MED ORDER — LACTATED RINGERS IV SOLN
INTRAVENOUS | Status: DC
  Administered 2013-01-14 (×2): via INTRAVENOUS

## 2013-01-14 MED ORDER — FENTANYL CITRATE 0.05 MG/ML IJ SOLN: 50.0000 ug | INTRAMUSCULAR | Status: DC | PRN

## 2013-01-14 MED ORDER — BUPIVACAINE HCL (PF) 0.25 % IJ SOLN
INTRAMUSCULAR | Status: DC | PRN
  Administered 2013-01-14: 20 mL

## 2013-01-14 MED ORDER — MIDAZOLAM HCL 5 MG/5ML IJ SOLN
INTRAMUSCULAR | Status: DC | PRN
  Administered 2013-01-14: 1 mg via INTRAVENOUS

## 2013-01-14 MED ORDER — HYDROMORPHONE HCL PF 1 MG/ML IJ SOLN: 0.2500 mg | INTRAMUSCULAR | Status: DC | PRN

## 2013-01-14 MED ORDER — SUCCINYLCHOLINE CHLORIDE 20 MG/ML IJ SOLN
INTRAMUSCULAR | Status: DC | PRN
  Administered 2013-01-14: 120 mg via INTRAVENOUS

## 2013-01-14 MED ORDER — PROPOFOL 10 MG/ML IV BOLUS
INTRAVENOUS | Status: DC | PRN
  Administered 2013-01-14: 200 mg via INTRAVENOUS

## 2013-01-14 MED ORDER — OXYCODONE HCL 5 MG/5ML PO SOLN: 5.0000 mg | Freq: Once | ORAL | Status: DC | PRN

## 2013-01-14 MED ORDER — DEXTROSE 5 % IV SOLN
2.0000 g | INTRAVENOUS | Status: AC
  Administered 2013-01-14: 2 g via INTRAVENOUS

## 2013-01-14 MED ORDER — OXYCODONE HCL 5 MG PO TABS: 5.0000 mg | ORAL_TABLET | Freq: Once | ORAL | Status: DC | PRN

## 2013-01-14 MED ORDER — FENTANYL CITRATE 0.05 MG/ML IJ SOLN
INTRAMUSCULAR | Status: DC | PRN
  Administered 2013-01-14 (×4): 50 ug via INTRAVENOUS

## 2013-01-14 SURGICAL SUPPLY — 45 items
BLADE HEX COATED 2.75 (ELECTRODE) ×2 IMPLANT
BLADE SURG 15 STRL LF DISP TIS (BLADE) ×1 IMPLANT
BLADE SURG 15 STRL SS (BLADE) ×2
BRIEF STRETCH FOR OB PAD LRG (UNDERPADS AND DIAPERS) ×2 IMPLANT
CANISTER SUCTION 2500CC (MISCELLANEOUS) ×2 IMPLANT
CLOTH BEACON ORANGE TIMEOUT ST (SAFETY) ×2 IMPLANT
COVER MAYO STAND STRL (DRAPES) ×2 IMPLANT
COVER TABLE BACK 60X90 (DRAPES) ×2 IMPLANT
DECANTER SPIKE VIAL GLASS SM (MISCELLANEOUS) ×2 IMPLANT
DRSG PAD ABDOMINAL 8X10 ST (GAUZE/BANDAGES/DRESSINGS) IMPLANT
ELECT REM PT RETURN 9FT ADLT (ELECTROSURGICAL) ×2
ELECTRODE REM PT RTRN 9FT ADLT (ELECTROSURGICAL) ×1 IMPLANT
GAUZE SPONGE 4X4 16PLY XRAY LF (GAUZE/BANDAGES/DRESSINGS) ×2 IMPLANT
GAUZE VASELINE 3X9 (GAUZE/BANDAGES/DRESSINGS) IMPLANT
GLOVE BIO SURGEON STRL SZ7 (GLOVE) ×2 IMPLANT
GLOVE BIOGEL PI IND STRL 7.0 (GLOVE) ×1 IMPLANT
GLOVE BIOGEL PI INDICATOR 7.0 (GLOVE) ×1
GLOVE ECLIPSE 6.5 STRL STRAW (GLOVE) ×3 IMPLANT
GLOVE ECLIPSE 7.0 STRL STRAW (GLOVE) ×1 IMPLANT
GLOVE INDICATOR 7.0 STRL GRN (GLOVE) ×1 IMPLANT
GLOVE SURG SS PI 7.5 STRL IVOR (GLOVE) ×4 IMPLANT
GOWN PREVENTION PLUS LG XLONG (DISPOSABLE) ×2 IMPLANT
GOWN STRL NON-REIN LRG LVL3 (GOWN DISPOSABLE) ×5 IMPLANT
GOWN STRL REIN XL XLG (GOWN DISPOSABLE) ×4 IMPLANT
NDL HYPO 25X1 1.5 SAFETY (NEEDLE) ×1 IMPLANT
NDL SAFETY ECLIPSE 18X1.5 (NEEDLE) IMPLANT
NEEDLE HYPO 18GX1.5 SHARP (NEEDLE)
NEEDLE HYPO 25X1 1.5 SAFETY (NEEDLE) ×2 IMPLANT
NS IRRIG 1000ML POUR BTL (IV SOLUTION) IMPLANT
PACK BASIN DAY SURGERY FS (CUSTOM PROCEDURE TRAY) ×2 IMPLANT
PENCIL BUTTON HOLSTER BLD 10FT (ELECTRODE) ×2 IMPLANT
SPONGE GAUZE 4X4 12PLY (GAUZE/BANDAGES/DRESSINGS) IMPLANT
SPONGE SURGIFOAM ABS GEL 100 (HEMOSTASIS) ×1 IMPLANT
SPONGE SURGIFOAM ABS GEL 12-7 (HEMOSTASIS) ×6 IMPLANT
SURGILUBE 2OZ TUBE FLIPTOP (MISCELLANEOUS) ×2 IMPLANT
SUT CHROMIC 2 0 SH (SUTURE) IMPLANT
SUT CHROMIC 3 0 SH 27 (SUTURE) IMPLANT
SUT MON AB 3-0 SH 27 (SUTURE)
SUT MON AB 3-0 SH27 (SUTURE) IMPLANT
SUT VIC AB 3-0 SH 18 (SUTURE) IMPLANT
SUT VIC AB 4-0 P-3 18XBRD (SUTURE) IMPLANT
SUT VIC AB 4-0 P3 18 (SUTURE)
SYR CONTROL 10ML LL (SYRINGE) ×2 IMPLANT
TOWEL OR 17X24 6PK STRL BLUE (TOWEL DISPOSABLE) ×2 IMPLANT
TUBE CONNECTING 20X1/4 (TUBING) ×2 IMPLANT

## 2013-01-14 NOTE — Anesthesia Preprocedure Evaluation (Addendum)
Anesthesia Evaluation  Patient identified by MRN, date of birth, ID band Patient awake    Reviewed: Allergy & Precautions, H&P , NPO status , Patient's Chart, lab work & pertinent test results  Airway Mallampati: I TM Distance: >3 FB Neck ROM: Full    Dental  (+) Teeth Intact, Partial Lower, Partial Upper and Dental Advisory Given   Pulmonary  breath sounds clear to auscultation        Cardiovascular Rhythm:Regular Rate:Normal     Neuro/Psych    GI/Hepatic   Endo/Other    Renal/GU      Musculoskeletal   Abdominal   Peds  Hematology   Anesthesia Other Findings   Reproductive/Obstetrics                           Anesthesia Physical Anesthesia Plan  ASA: I  Anesthesia Plan: General   Post-op Pain Management:    Induction: Intravenous  Airway Management Planned: LMA  Additional Equipment:   Intra-op Plan:   Post-operative Plan: Extubation in OR  Informed Consent: I have reviewed the patients History and Physical, chart, labs and discussed the procedure including the risks, benefits and alternatives for the proposed anesthesia with the patient or authorized representative who has indicated his/her understanding and acceptance.   Dental advisory given  Plan Discussed with: CRNA, Anesthesiologist and Surgeon  Anesthesia Plan Comments:         Anesthesia Quick Evaluation

## 2013-01-14 NOTE — Transfer of Care (Signed)
Immediate Anesthesia Transfer of Care Note  Patient: Melanie Roberts  Procedure(s) Performed: Procedure(s): EXAM UNDER ANESTHESIA WITH HEMORRHOIDECTOMY POSSIBLE BANDING (N/A)  Patient Location: PACU  Anesthesia Type:General  Level of Consciousness: sedated  Airway & Oxygen Therapy: Patient Spontanous Breathing and Patient connected to face mask oxygen  Post-op Assessment: Report given to PACU RN and Post -op Vital signs reviewed and stable  Post vital signs: Reviewed and stable  Complications: No apparent anesthesia complications

## 2013-01-14 NOTE — Anesthesia Postprocedure Evaluation (Signed)
  Anesthesia Post-op Note  Patient: Melanie Roberts  Procedure(s) Performed: Procedure(s): EXAM UNDER ANESTHESIA WITH HEMORRHOIDECTOMY POSSIBLE BANDING (N/A)  Patient Location: PACU  Anesthesia Type:General  Level of Consciousness: awake, alert  and oriented  Airway and Oxygen Therapy: Patient Spontanous Breathing  Post-op Pain: mild  Post-op Assessment: Post-op Vital signs reviewed  Post-op Vital Signs: Reviewed  Complications: No apparent anesthesia complications

## 2013-01-14 NOTE — Interval H&P Note (Signed)
History and Physical Interval Note:  01/14/2013 11:32 AM  Melanie Roberts  has presented today for surgery, with the diagnosis of hemorrhoids  The various methods of treatment have been discussed with the patient and family. After consideration of risks, benefits and other options for treatment, the patient has consented to  Procedure(s): EXAM UNDER ANESTHESIA WITH HEMORRHOIDECTOMY POSSIBLE BANDING (N/A) as a surgical intervention .  The patient's history has been reviewed, patient examined, no change in status, stable for surgery.  I have reviewed the patient's chart and labs.  Questions were answered to the patient's satisfaction. I have seen the patient in the preop area.  She denies any change from prior exam.  She says that she continues to have a "grape" prolapse intermittently from the rectum mainly on the right.   I again discussed with her the risks of the procedure including infection, bleeding, pain, scarring, recurrence, stricture, incontinence, and need for another procedure, and negative exam and she expressed understanding and desires to proceed with rectal exam under anesthesia with possible hemorrhoidectomy.   Lodema Pilot DAVID

## 2013-01-14 NOTE — Anesthesia Procedure Notes (Signed)
Procedure Name: Intubation Date/Time: 01/14/2013 11:48 AM Performed by: Meyer Russel Pre-anesthesia Checklist: Patient identified, Emergency Drugs available, Suction available and Patient being monitored Patient Re-evaluated:Patient Re-evaluated prior to inductionOxygen Delivery Method: Circle System Utilized Preoxygenation: Pre-oxygenation with 100% oxygen Intubation Type: IV induction Ventilation: Mask ventilation without difficulty Laryngoscope Size: Miller and 2 Grade View: Grade II Tube type: Oral Tube size: 7.0 mm Number of attempts: 1 Airway Equipment and Method: stylet Placement Confirmation: ETT inserted through vocal cords under direct vision,  positive ETCO2 and breath sounds checked- equal and bilateral Secured at: 22 cm Tube secured with: Tape Dental Injury: Teeth and Oropharynx as per pre-operative assessment

## 2013-01-14 NOTE — H&P (View-Only) (Signed)
Patient ID: Melanie Roberts, female   DOB: 05/15/1971, 42 y.o.   MRN: [id number]  Chief Complaint  Patient presents with  . New Evaluation    Eval Hemorrhoids    HPI Melanie Roberts is a 42 y.o. female.  This patient is referred by Dr. Leanor Kail for evaluation of hemorrhoids. She said that she has had hemorrhoids for several years since the birth of her children but she has put up with the for many years. She says it bothers her daily causing some problems such as difficulty with hygiene and bleeding with bowel movements and spontaneous prolapse. She says that she does have some bright red bleeding mainly on the right which is the side of of most of her symptoms. She says that she does have prolapsed hemorrhoids mainly on the right to the size of a grape that these will reduce spontaneously. She says it is caused some discomfort with sitting at work. She does say that her bowels are normal moving her bowels about every day or every other day. She does take stool softeners daily which he has done for several years she is not on any routine fiber supplements. She says that she has modify her diet to try to accommodate increase fiber in she does move her bowels normally and no soft bowel movements but despite this she continues to have discomfort and bleeding and symptoms. HPI  Past Medical History  Diagnosis Date  . Thyroid disease   . Allergy     Past Surgical History  Procedure Laterality Date  . Abdominal hysterectomy      No family history on file.  Social History History  Substance Use Topics  . Smoking status: Never Smoker   . Smokeless tobacco: Not on file  . Alcohol Use: Yes    No Known Allergies  Current Outpatient Prescriptions  Medication Sig Dispense Refill  . fluticasone (FLONASE) 50 MCG/ACT nasal spray Place 2 sprays into the nose daily. Apply to each nostril  16 g  0  . levothyroxine (SYNTHROID, LEVOTHROID) 137 MCG tablet Take 137 mcg by mouth daily.        No current facility-administered medications for this visit.    Review of Systems Review of Systems All other review of systems negative or noncontributory except as stated in the HPI  There were no vitals taken for this visit.  Physical Exam Physical Exam Physical Exam  Nursing note and vitals reviewed. Constitutional: She is oriented to person, place, and time. She appears well-developed and well-nourished. No distress.  HENT:  Head: Normocephalic and atraumatic.  Mouth/Throat: No oropharyngeal exudate.  Eyes: Conjunctivae and EOM are normal. Pupils are equal, round, and reactive to light. Right eye exhibits no discharge. Left eye exhibits no discharge. No scleral icterus.  Neck: Normal range of motion. Neck supple. No tracheal deviation present.  Cardiovascular: Normal rate, regular rhythm, normal heart sounds and intact distal pulses.   Pulmonary/Chest: Effort normal and breath sounds normal. No stridor. No respiratory distress. She has no wheezes.  Abdominal: Soft. Bowel sounds are normal. She exhibits no distension and no mass. There is no tenderness. There is no rebound and no guarding.  Musculoskeletal: Normal range of motion. She exhibits no edema and no tenderness.  Neurological: She is alert and oriented to person, place, and time.  Skin: Skin is warm and dry. No rash noted. She is not diaphoretic. No erythema. No pallor.  Psychiatric: She has a normal mood and affect. Her behavior is normal. Judgment and thought  content normal.  Rectal: She has some external skin circumferentially but no obvious significant external hemorrhoids. Anoscopic exam was performed which demonstrated some internal hemorrhoidal tissue without evidence of bleeding or any masses. The visible disease did not appear that significant on exam.  Data Reviewed   Assessment    Internal hemorrhoids with bleeding and discomfort She has a story which is pretty good for symptomatic hemorrhoids. It sounds  as though her symptoms are more significant on the right. She has tried stool softeners and dietary modifications and no this has improved her bowel movements and bowel frequency, this has not relieved her symptoms from her hemorrhoids. She says that she suffered from  this for many years and is interested in surgical therapy. I had a long discussion with her regarding the surgical options including hemorrhoidectomy or banding, and as well as the pros and cons and risks and benefits.  We discussed the risks of infection, bleeding, pain, scarring, stricture, incontinence to stool or gas, and recurrence and persistent pain and she expressed understanding and would like to proceed with rectal exam under anesthesia with possible hemorrhoidectomy and/or banding.    Plan    We will set her up for rectal exam under anesthesia with possible hemorrhoidectomy or banding        Hadlei Stitt DAVID 12/20/2012, 11:11 AM

## 2013-01-14 NOTE — Brief Op Note (Signed)
01/14/2013  12:56 PM  PATIENT:  Melanie Roberts  42 y.o. female  PRE-OPERATIVE DIAGNOSIS:  hemorrhoids  POST-OPERATIVE DIAGNOSIS:  hemorrhoids  PROCEDURE:  Procedure(s): EXAM UNDER ANESTHESIA WITH HEMORRHOIDECTOMY POSSIBLE BANDING (N/A)  SURGEON:  Surgeon(s) and Role:    * Lodema Pilot, DO - Primary  PHYSICIAN ASSISTANT:   ASSISTANTS: none   ANESTHESIA:   general  EBL:  Total I/O In: 1400 [I.V.:1400] Out: -   BLOOD ADMINISTERED:none  DRAINS: none   LOCAL MEDICATIONS USED:  MARCAINE    and LIDOCAINE   SPECIMEN:  Source of Specimen:  rt posterior hemorrhoid, left posterior hemorrhoid  DISPOSITION OF SPECIMEN:  PATHOLOGY  COUNTS:  YES  TOURNIQUET:  * No tourniquets in log *  DICTATION: .Other Dictation: Dictation Number dictated  PLAN OF CARE: Discharge to home after PACU  PATIENT DISPOSITION:  PACU - hemodynamically stable.   Delay start of Pharmacological VTE agent (>24hrs) due to surgical blood loss or risk of bleeding: no

## 2013-01-15 ENCOUNTER — Encounter (HOSPITAL_BASED_OUTPATIENT_CLINIC_OR_DEPARTMENT_OTHER): Payer: Self-pay | Admitting: General Surgery

## 2013-01-15 NOTE — Op Note (Signed)
NAMEMEAH, JIRON NO.:  [id number]  MEDICAL RECORD NO.:  [id number]  LOCATION:                                 FACILITY:  PHYSICIAN:  Lodema Pilot, MD            DATE OF BIRTH:  DATE OF PROCEDURE:  01/14/2013 DATE OF DISCHARGE:                              OPERATIVE REPORT   PROCEDURE:  Rectal exam under anesthesia with open hemorrhoidectomy x2.  SURGEON:  Lodema Pilot, MD  ASSISTANT:  None.  ANESTHESIA:  General endotracheal tube anesthesia with 30 mL of 1% lidocaine with epinephrine and 0.25% Marcaine in a 50:50 mixture.  PREOPERATIVE DIAGNOSIS:  Hemorrhoids.  POSTOPERATIVE DIAGNOSIS:  Hemorrhoids.  SPECIMENS: 1. Right posterior external hemorrhoid. 2. Left posterior external hemorrhoid.  FLUIDS:  1500 mL of crystalloid.  ESTIMATED BLOOD LOSS:  Minimal.  DRAINS:  None.  COMPLICATIONS:  None apparent.  FINDINGS:  No significant internal hemorrhoidal disease.  She had redundant skin externally with the largest area of external hemorrhoids in the right posterior region and a small external hemorrhoid was excised in the left posterior area.  INDICATION FOR PROCEDURE:  Ms. Coralie Carpen is a 42 year old female with complaints of bleeding from her rectum and a prolapse of what she describes as a "grape", which was spontaneously reduced after bowel movements.  OPERATIVE DETAILS:  Ms. Coralie Carpen was seen and evaluated in the preoperative area and risks and benefits of procedure were again discussed in lay terms.  Informed consent was obtained.  She was taken to the operating room and general endotracheal anesthesia was obtained, and prophylactic antibiotics were given.  She was flipped into a prone jack-knife position, and her buttocks were taped laterally and the area was prepped and draped in a standard surgical fashion.  Digital rectal exam revealed no palpable mass lesions and an anoscope was passed into the anal canal and I  inspected the area circumferentially.  I did not see any significant internal disease, no internal masses or polyps which could be accounting for the prolapsing mass that she describes.  The only abnormality that I identified was some external skins and some external hemorrhoid in the right posterior column and another small amyloid at the left posterior area of the anus.  She had some extra redundant skin anteriorly, but felt that this was normal appearance.  I did not see much internal disease, I do not feel that these were amenable to band ligation.  An open excisional hemorrhoidectomy was performed.  A 2-0 Vicryl suture was placed at the apex of the hemorrhoidal columns starting on the right posterior column, and this was tagged for later use.  An elliptical incision was used to excise the external hemorrhoidal tissue staying superficial to the sphincter muscles, which were easily palpable.  Then, the ellipse of hemorrhoidal tissue was completely excised and sent to Pathology for permanent sectioning.  Then using the previously placed 2-0 Vicryl suture, the mucosa was approximated with a running locking 2-0 Vicryl suture from internal to external bleeding.  The last few millimeters of the incision opened for drainage.  The suture was run back to the original to try the suture  it internally and sutured to itself, and a similar procedure was performed for smaller left posterior regions.  Both these areas together was less than one-third of the circumference of the anal canal.  So, I felt that there would be low risk for stricture.  After this was performed, the area was inspected for hemostasis, which was noted to be adequate and this seemed to take care of the external tissue and prolapsing tissue.  The area was injected with 1% lidocaine with epinephrine and 0.25% Marcaine in a 50:50 mixture and the dibucaine- coated Gelfoam gauze was packed into the anal canal for added  hemostasis and comfort.  All sponge, needle and instrument counts were correct at the end of the case, and the patient tolerated the procedure well without apparent complication.  She was stable and ready for transfer to recovery room.          ______________________________ Lodema Pilot, MD     BL/MEDQ  D:  01/14/2013  T:  01/15/2013  Job:  [id number]

## 2013-01-17 ENCOUNTER — Encounter (INDEPENDENT_AMBULATORY_CARE_PROVIDER_SITE_OTHER): Payer: Federal, State, Local not specified - PPO | Admitting: General Surgery

## 2013-01-20 ENCOUNTER — Telehealth (INDEPENDENT_AMBULATORY_CARE_PROVIDER_SITE_OTHER): Payer: Self-pay | Admitting: *Deleted

## 2013-01-20 NOTE — Telephone Encounter (Signed)
Patient states she has yet to have a bowel movement since her surgery last week.  Encouraged patient to go ahead and start on Milk of Mag.  Patient states she is already taking a stool softener.  Also encouraged patient to be moving around and drinking plenty of fluids.

## 2013-01-30 ENCOUNTER — Encounter (INDEPENDENT_AMBULATORY_CARE_PROVIDER_SITE_OTHER): Payer: Self-pay | Admitting: General Surgery

## 2013-01-30 ENCOUNTER — Ambulatory Visit (INDEPENDENT_AMBULATORY_CARE_PROVIDER_SITE_OTHER): Payer: Federal, State, Local not specified - PPO | Admitting: General Surgery

## 2013-01-30 VITALS — BP 130/84 | HR 71 | Temp 97.5°F | Resp 16 | Ht 67.0 in | Wt 213.6 lb

## 2013-01-30 DIAGNOSIS — Z4889 Encounter for other specified surgical aftercare: Secondary | ICD-10-CM

## 2013-01-30 DIAGNOSIS — Z5189 Encounter for other specified aftercare: Secondary | ICD-10-CM

## 2013-01-30 NOTE — Progress Notes (Signed)
Subjective:     Patient ID: Melanie Roberts, female   DOB: February 08, 1971, 42 y.o.   MRN: [id number]  HPI She follows up 3 weeks status post open hemorrhoidectomy. She says her pain is improved with good days and bad days but really isn't taking much pain medication. She still has had some issues with her bowel movements and constipation. She is taking some Colace without much improvement and has been taking milk of magnesia as well. She still has some occasional bleeding and her main issue is some itching and some drainage  Review of Systems     Objective:   Physical Exam The area of his healing okay with some healthy granulation tissue the Vicryl suture as apparently pulled through some of the tissue in this hanging loose and I trimmed this back today.  She has some granulation tissue which was healthy and bleeding    Assessment:     Status post open hemorrhoidectomy She still has some healing tissue in the area of I think that this should continue to improve with some additional time. I think her main issue is improvement in her bowels and as she begins to move her bowels more regularly this allowed [place] to heal. Recommended that she take some MiraLAX as needed to assist with her bowel movement and recommended another 3 weeks of recovery and I will see her back at that time for repeat evaluation. In the meantime, I recommended that she keep area dry to minimize itching and rash    Plan:     I will see her back in 3 weeks

## 2013-02-20 ENCOUNTER — Encounter (INDEPENDENT_AMBULATORY_CARE_PROVIDER_SITE_OTHER): Payer: Self-pay | Admitting: General Surgery

## 2013-02-20 ENCOUNTER — Encounter (INDEPENDENT_AMBULATORY_CARE_PROVIDER_SITE_OTHER): Payer: Self-pay

## 2013-02-20 ENCOUNTER — Ambulatory Visit (INDEPENDENT_AMBULATORY_CARE_PROVIDER_SITE_OTHER): Payer: Federal, State, Local not specified - PPO | Admitting: General Surgery

## 2013-02-20 VITALS — BP 122/84 | HR 70 | Temp 98.7°F | Resp 16 | Ht 67.0 in | Wt 215.0 lb

## 2013-02-20 DIAGNOSIS — Z5189 Encounter for other specified aftercare: Secondary | ICD-10-CM

## 2013-02-20 DIAGNOSIS — Z4889 Encounter for other specified surgical aftercare: Secondary | ICD-10-CM

## 2013-02-20 NOTE — Progress Notes (Signed)
Subjective:     Patient ID: Melanie Roberts, female   DOB: 1970-12-12, 42 y.o.   MRN: [id number]  HPI This patient follows up status post open hemorrhoidectomy at the end of March. She says that she is continuing to improve daily. She is not having discomfort in the area and is off all pain medication. She is taking over-the-counter stool softeners to move her bowels and she moves her bowels about every other day. She still has a streak of blood on her stools but she says this is improving as well. She is trying to takes over-the-counter fiber is having difficulty with this.  Review of Systems     Objective:   Physical Exam On exam the hemorrhoidectomy site seems to be healing nicely. She still has some area of granulation and is not completely healed although there is no evidence of active bleeding. The area is nontender and actually looks to be healing very nicely    Assessment:     Status post open hemorrhoidectomy-doing well I recommended that she continue with her current bowel regimen and increase her fiber intake as tolerated. I stressed the importance of good bowel regimen to allow continued healing and to prevent recurrence. Recommended that she follow up with me in one month to see if she has had complete resolution of the bleeding. She can return to work as tolerated     Plan:     She can return to work as tolerated and a return to work note was provided today. Continue with high-fiber diet and stool softeners as needed and I'll see her back in one month if the bleeding is completely resolve

## 2013-04-04 ENCOUNTER — Encounter (INDEPENDENT_AMBULATORY_CARE_PROVIDER_SITE_OTHER): Payer: Federal, State, Local not specified - PPO | Admitting: General Surgery

## 2014-01-19 ENCOUNTER — Ambulatory Visit: Payer: BC Managed Care – PPO | Admitting: Podiatry

## 2014-09-08 ENCOUNTER — Ambulatory Visit (INDEPENDENT_AMBULATORY_CARE_PROVIDER_SITE_OTHER): Payer: BC Managed Care – PPO | Admitting: Physician Assistant

## 2014-09-08 VITALS — BP 116/74 | HR 67 | Temp 98.1°F | Resp 16 | Ht 65.75 in | Wt 207.4 lb

## 2014-09-08 DIAGNOSIS — H1013 Acute atopic conjunctivitis, bilateral: Secondary | ICD-10-CM

## 2014-09-08 MED ORDER — KETOTIFEN FUMARATE 0.025 % OP SOLN: 1.0000 [drp] | Freq: Two times a day (BID) | OPHTHALMIC | Status: DC

## 2014-09-08 MED ORDER — CETIRIZINE HCL 10 MG PO TABS: 10.0000 mg | ORAL_TABLET | Freq: Every day | ORAL | Status: DC

## 2014-09-08 NOTE — Patient Instructions (Addendum)
Your eye swelling, blurriness, and tearing are most likely due to an allergic conjunctivitis. Please take the zyrtec once a day for one week. Please use the eye drops one drop in each eye twice daily for one week. Please apply a cool compress to your eyes twice daily.  These measures should help with your symptoms.  If you are not feeling better or if your blurriness is not improved in a few days please return to clinic. We may refer you to an eye doctor at that popint.

## 2014-09-08 NOTE — Progress Notes (Signed)
Subjective:    Patient ID: Melanie Roberts, female    DOB: 1971-03-23, 43 y.o.   MRN: [id number]  Benito Mccreedy, MD  Chief Complaint  Patient presents with  . Eye Problem    bilateral eye swelling with itching.     There are no active problems to display for this patient.  Prior to Admission medications   Medication Sig Start Date End Date Taking? Authorizing Provider  docusate sodium (COLACE) 100 MG capsule Take 1 capsule (100 mg total) by mouth 2 (two) times daily. 01/14/13  Yes Madilyn Hook, DO  levothyroxine (SYNTHROID, LEVOTHROID) 175 MCG tablet Take 175 mcg by mouth daily before breakfast.   Yes Historical Provider, MD  Vitamin D, Ergocalciferol, (DRISDOL) 50000 UNITS CAPS Take 50,000 Units by mouth daily.    Yes Historical Provider, MD   Medications, allergies, past medical history, surgical history, family history, social history and problem list reviewed and updated.  HPI  43 yof with no pertinent PMH presents with bilateral eyelid swelling, itchy eyes, and tearing that started yest morning.   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 When she awoke yest she had the tearing and itchy eyes along with the eyelid swelling. Denies any crusty on lids or purulence. She has no hx allergies. Took one benadryl yest which did not help sx.   She mentions her vision has also been slightly blurry past couple days. She denies any decreased vision, just that it has been slightly blurry. Denies any eye pain. She does not wear glasses/contacts or see an eye dr. She denies any uri sx. No sick contacts.   Denies any trouble breathing, trouble swallowing, lip or tongue swelling, itchy throat, cough, SOB, CP, or HAs.   Review of Systems No fever, chills. See HPI.     Objective:   Physical Exam  Constitutional: She appears well-developed and well-nourished.  Non-toxic appearance. She does not have a sickly appearance. She does not appear  ill. No distress.  BP 116/74 mmHg  Pulse 67  Temp(Src) 98.1 F (36.7 C) (Oral)  Resp 16  Ht 5' 5.75" (1.67 m)  Wt 207 lb 6.4 oz (94.076 kg)  BMI 33.73 kg/m2  SpO2 99%   HENT:  Head: Normocephalic and atraumatic.  Mouth/Throat: Uvula is midline and oropharynx is clear and moist. No oropharyngeal exudate, posterior oropharyngeal edema or posterior oropharyngeal erythema.  No lip or tongue swelling.   Eyes: EOM are normal. Pupils are equal, round, and reactive to light. Right eye exhibits no discharge, no exudate and no hordeolum. No foreign body present in the right eye. Left eye exhibits no discharge, no exudate and no hordeolum. No foreign body present in the left eye. Right conjunctiva is injected. Left conjunctiva is injected. No scleral icterus. Right eye exhibits normal extraocular motion. Left eye exhibits normal extraocular motion.  Eyelids mildly swollen bilaterally. Left slightly more than right. No erythema. No pain with EOM. Vision testing. 20/20 R. 20/20 L. 20/13 together.   Lymphadenopathy:       Head (right side): No submental, no submandibular and no tonsillar adenopathy present.       Head (left side): No submental, no submandibular and no tonsillar adenopathy present.    She has no cervical adenopathy.      Assessment & Plan:   43 yof with no pertinent PMH presents with bilateral eyelid swelling, itchy eyes, and tearing that started yest morning.   Allergic conjunctivitis, bilateral - Plan:  cetirizine (ZYRTEC) 10 MG tablet, ketotifen (ZADITOR) 0.025 % ophthalmic solution --sx most likely allergic as she was working in dusty environment prior to sx --viral/bacterial less likely with lack of other uri sx and lack of crusting or purulence --Not concerning for urticaria/angioedema as no trouble swallowing/breathing, no swelling elsewhere --Oral and eyedrop antihistamine --Blurry vision recently but eye testing normal today. Likely due to allergic conj. If sx persist or  vision worsens rtc with possible optho/optometrist referral.   Julieta Gutting, PA-C Physician Assistant-Certified [facility]  09/08/2014 9:12 PM    antihistaime zaditor sscrip but over the counter Cool compresses

## 2014-09-11 NOTE — Progress Notes (Signed)
I was directly involved with the patient's care and agree with the diagnosis and treatment plan.

## 2015-08-24 ENCOUNTER — Ambulatory Visit (INDEPENDENT_AMBULATORY_CARE_PROVIDER_SITE_OTHER): Payer: Federal, State, Local not specified - PPO | Admitting: Internal Medicine

## 2015-08-24 ENCOUNTER — Encounter: Payer: Self-pay | Admitting: Physician Assistant

## 2015-08-24 VITALS — BP 124/92 | HR 66 | Resp 15

## 2015-08-24 DIAGNOSIS — T7840XA Allergy, unspecified, initial encounter: Secondary | ICD-10-CM | POA: Diagnosis not present

## 2015-08-24 MED ORDER — CETIRIZINE HCL 10 MG PO TABS: 10.0000 mg | ORAL_TABLET | Freq: Every day | ORAL | Status: DC

## 2015-08-24 MED ORDER — METHYLPREDNISOLONE SODIUM SUCC 125 MG IJ SOLR
125.0000 mg | Freq: Once | INTRAMUSCULAR | Status: AC
  Administered 2015-08-24: 125 mg via INTRAMUSCULAR

## 2015-08-24 MED ORDER — DIPHENHYDRAMINE HCL 25 MG PO TABS: 25.0000 mg | ORAL_TABLET | ORAL | Status: DC | PRN

## 2015-08-24 MED ORDER — RANITIDINE HCL 150 MG PO TABS: 150.0000 mg | ORAL_TABLET | Freq: Two times a day (BID) | ORAL | Status: DC

## 2015-08-24 MED ORDER — CETIRIZINE HCL 10 MG PO TABS
10.0000 mg | ORAL_TABLET | Freq: Once | ORAL | Status: AC
  Administered 2015-08-24: 10 mg via ORAL

## 2015-08-24 MED ORDER — PREDNISONE 20 MG PO TABS: 40.0000 mg | ORAL_TABLET | Freq: Every day | ORAL | Status: DC

## 2015-08-24 MED ORDER — RANITIDINE HCL 150 MG PO TABS
300.0000 mg | ORAL_TABLET | Freq: Once | ORAL | Status: AC
  Administered 2015-08-24: 300 mg via ORAL

## 2015-08-24 NOTE — Progress Notes (Signed)
08/25/2015 at 11:38 AM  Melanie Roberts / DOB: 1970/11/24 / MRN: [id number]  The patient  does not have a problem list on file.  SUBJECTIVE  Melanie Roberts is a 44 y.o. female who complains of lip swelling and itching that started on Saturday.  She feels that the lip swelling suddenly became worse today and she took two Benadryl at 5 pm and decided to come in. Denies throat swelling, difficulty swallowing, tongue swelling.  Denies any new exposures to foods, detergents and medications.  Does not take BP medication. No history of asthma. This has never happened before.  She is seen by endocrinology for a history of hypothyroidism and had blood work last month confirming she is not a diabetic.   She  has a past medical history of Hypothyroidism; Wears partial dentures; Headache(784.0); and Hemorrhoids (12/2012).    Medications reviewed and updated by myself where necessary, and exist elsewhere in the encounter.   Ms. Melanie Roberts is allergic to bee venom. She  reports that she has never smoked. She has never used smokeless tobacco. She reports that she drinks alcohol. She reports that she does not use illicit drugs. She  has no sexual activity history on file. The patient  has past surgical history that includes Tonsillectomy and adenoidectomy (age 64); Myomectomy (12/18/2001); Tubal ligation (07/08/2003); Cesarean section (07/08/2003); Adenoidectomy (02/17/2005); Nasal turbinate reduction (Bilateral, 02/07/2005); Laparoscopic total hysterectomy (10/29/2006); and Exam under anesthesia with hemorrhoidectomy (N/A, 01/14/2013).  Her family history includes Cancer in her father; Diabetes in her mother.  Review of Systems  Constitutional: Negative for fever.  Respiratory: Negative for cough and wheezing.   Cardiovascular: Negative for chest pain and leg swelling.  Gastrointestinal: Negative for nausea.  Musculoskeletal: Negative for neck pain.  Skin: Positive for itching and rash.    Neurological: Negative for dizziness and headaches.    OBJECTIVE  Her  blood pressure is 124/92 and her pulse is 66. Her respiration is 15 and oxygen saturation is 96%.  The patient's body mass index is unknown because there is no weight on file.    Physical Exam  Constitutional: She is oriented to person, place, and time. She appears well-developed and well-nourished. No distress.  HENT:  Mouth/Throat:    Cardiovascular: Normal rate, regular rhythm and normal heart sounds.   Respiratory: Effort normal and breath sounds normal.  Musculoskeletal: Normal range of motion.  Neurological: She is alert and oriented to person, place, and time. No cranial nerve deficit.  Skin: Skin is warm and dry. Rash noted. She is not diaphoretic.     Psychiatric: She has a normal mood and affect. Her behavior is normal. Judgment and thought content normal.   Today's Vitals   08/24/15 2046 08/24/15 2058 08/24/15 2130  BP: 158/94 134/92 124/92  Pulse: 75 72 66  Resp: 14 15 15   SpO2: 99% 98% 96%    No results found for this or any previous visit (from the past 24 hour(s)).  ASSESSMENT & PLAN  Nil was seen today for oral swelling and pruritis.  Diagnoses and all orders for this visit:  Allergic reaction, initial encounter: Unknown etiology. Her vitals are stable. Case discussed with Dr. Kathrin Penner and he is in agreement with the plan.  Patient to go to the ED tonight if she suddenly becomes worse.  Her son in at home with her and her mother is 2 minutes away and amenable to transport, or calling 911.  Patient to return tomorrow if she is worsening despite therapy.  Visit orders: -     ranitidine (ZANTAC) tablet 300 mg; Take 2 tablets (300 mg total) by mouth once. -     cetirizine (ZYRTEC) tablet 10 mg; Take 1 tablet (10 mg total) by mouth once. -     methylPREDNISolone sodium succinate (SOLU-MEDROL) 125 mg/2 mL injection 125 mg; Inject 2 mLs (125 mg total) into the muscle once.  Outpatient  orders:  -     cetirizine (ZYRTEC) 10 MG tablet; Take 1 tablet (10 mg total) by mouth daily. -     ranitidine (ZANTAC) 150 MG tablet; Take 1 tablet (150 mg total) by mouth 2 (two) times daily. -     predniSONE (DELTASONE) 20 MG tablet; Take 2 tablets (40 mg total) by mouth daily with breakfast. -     diphenhydrAMINE (BENADRYL) 25 MG tablet; Take 1 tablet (25 mg total) by mouth every 4 (four) hours as needed for itching.    The patient was advised to call or come back to clinic if she does not see an improvement in symptoms, or worsens with the above plan.   Philis Fendt, MHS, PA-C [facility] 08/25/2015 11:38 AM I have participated in the care of this patient with the Advanced Practice Provider and agree with Diagnosis and Plan as documented. Robert P. Laney Pastor, M.D.

## 2015-09-28 ENCOUNTER — Ambulatory Visit (INDEPENDENT_AMBULATORY_CARE_PROVIDER_SITE_OTHER): Payer: Federal, State, Local not specified - PPO | Admitting: Physician Assistant

## 2015-09-28 VITALS — BP 110/82 | HR 78 | Temp 98.6°F | Resp 18 | Ht 65.5 in | Wt 207.0 lb

## 2015-09-28 DIAGNOSIS — R220 Localized swelling, mass and lump, head: Secondary | ICD-10-CM | POA: Diagnosis not present

## 2015-09-28 DIAGNOSIS — L501 Idiopathic urticaria: Secondary | ICD-10-CM | POA: Diagnosis not present

## 2015-09-28 LAB — POCT CBC
Granulocyte percent: 54 %G (ref 37–80)
HCT, POC: 36.4 % — AB (ref 37.7–47.9)
HEMOGLOBIN: 12.3 g/dL (ref 12.2–16.2)
LYMPH, POC: 2.1 (ref 0.6–3.4)
MCH, POC: 28.5 pg (ref 27–31.2)
MCHC: 33.8 g/dL (ref 31.8–35.4)
MCV: 84.4 fL (ref 80–97)
MID (cbc): 0.2 (ref 0–0.9)
MPV: 7.3 fL (ref 0–99.8)
POC Granulocyte: 2.8 (ref 2–6.9)
POC LYMPH %: 41.6 % (ref 10–50)
POC MID %: 4.4 %M (ref 0–12)
Platelet Count, POC: 323 10*3/uL (ref 142–424)
RBC: 4.31 M/uL (ref 4.04–5.48)
RDW, POC: 14.8 %
WBC: 5.1 10*3/uL (ref 4.6–10.2)

## 2015-09-28 LAB — POCT GLYCOSYLATED HEMOGLOBIN (HGB A1C): HEMOGLOBIN A1C: 5.9

## 2015-09-28 LAB — HEMOGLOBIN A1C: Hgb A1c MFr Bld: 5.9 % (ref 4.0–6.0)

## 2015-09-28 MED ORDER — PREDNISONE 20 MG PO TABS: ORAL_TABLET | ORAL | Status: AC

## 2015-09-28 NOTE — Progress Notes (Signed)
09/29/2015 2:31 PM   DOB: 1970-12-06 / MRN: [id number]  SUBJECTIVE:  Melanie Roberts is a 44 y.o. female presenting for a reemergence of lip swelling. She was treated by me on 08/24/15 for same with prednisone, zyrtec, ranitidine and benadryl as needed with excellent results.  She was also referred to an allergist at that time, and has that consult in 2 days.  She was advised by the Allergist's staff to stop all antihistamines, which she started four days ago, and these symptoms started 3 days ago.  She denies tongue and throat swelling at this time.    She is allergic to bee venom.   She  has a past medical history of Hypothyroidism; Wears partial dentures; Headache(784.0); and Hemorrhoids (12/2012).    She  reports that she has never smoked. She has never used smokeless tobacco. She reports that she drinks alcohol. She reports that she does not use illicit drugs. She  has no sexual activity history on file. The patient  has past surgical history that includes Tonsillectomy and adenoidectomy (age 60); Myomectomy (12/18/2001); Tubal ligation (07/08/2003); Cesarean section (07/08/2003); Adenoidectomy (02/17/2005); Nasal turbinate reduction (Bilateral, 02/07/2005); Laparoscopic total hysterectomy (10/29/2006); and Exam under anesthesia with hemorrhoidectomy (N/A, 01/14/2013).  Her family history includes Cancer in her father; Diabetes in her mother.  Review of Systems  Skin: Positive for itching.    Problem list and medications reviewed and updated by myself where necessary, and exist elsewhere in the encounter.   OBJECTIVE:  BP 110/82 mmHg  Pulse 78  Temp(Src) 98.6 F (37 C) (Oral)  Resp 18  Ht 5' 5.5" (1.664 m)  Wt 207 lb (93.895 kg)  BMI 33.91 kg/m2  SpO2 98% CrCl cannot be calculated (Patient has no serum creatinine result on file.).  Physical Exam  Constitutional: She is oriented to person, place, and time. She appears well-developed.  HENT:  Head:    Right Ear: Hearing and  tympanic membrane normal.  Left Ear: Hearing and tympanic membrane normal.  Mouth/Throat: Uvula is midline, oropharynx is clear and moist and mucous membranes are normal.    Eyes: EOM are normal. Pupils are equal, round, and reactive to light.  Cardiovascular: Normal rate.   Pulmonary/Chest: Effort normal.  Abdominal: She exhibits no distension.  Musculoskeletal: Normal range of motion.  Neurological: She is alert and oriented to person, place, and time. No cranial nerve deficit.  Skin: Skin is warm and dry. Rash noted. She is not diaphoretic. No erythema. No pallor.  Psychiatric: She has a normal mood and affect.  Vitals reviewed.   Results for orders placed or performed in visit on 09/28/15 (from the past 48 hour(s))  COMPLETE METABOLIC PANEL WITH GFR     Status: None (Preliminary result)   Collection Time: 09/28/15  5:37 PM  Result Value Ref Range   Sodium  135 - 146 mmol/L   Potassium  3.5 - 5.3 mmol/L   Chloride  98 - 110 mmol/L   CO2  20 - 31 mmol/L   Glucose, Bld  65 - 99 mg/dL   BUN  7 - 25 mg/dL   Creat  0.60 - 1.35 mg/dL   Total Bilirubin  0.2 - 1.2 mg/dL   Alkaline Phosphatase  40 - 115 U/L   AST  10 - 40 U/L   ALT  9 - 46 U/L   Total Protein  6.1 - 8.1 g/dL   Albumin  3.6 - 5.1 g/dL   Calcium  8.6 - 10.3 mg/dL  GFR, Est African American  >=60 mL/min   GFR, Est Non African American  >=60 mL/min  HIV antibody     Status: None   Collection Time: 09/28/15  5:37 PM  Result Value Ref Range   HIV 1&2 Ab, 4th Generation NONREACTIVE NONREACTIVE    Comment:   HIV-1 antigen and HIV-1/HIV-2 antibodies were not detected.  There is no laboratory evidence of HIV infection.   HIV-1/2 Antibody Diff        Not indicated. HIV-1 RNA, Qual TMA          Not indicated.     PLEASE NOTE: This information has been disclosed to you from records whose confidentiality may be protected by state law. If your state requires such protection, then the state law prohibits you from  making any further disclosure of the information without the specific written consent of the person to whom it pertains, or as otherwise permitted by law. A general authorization for the release of medical or other information is NOT sufficient for this purpose.   The performance of this assay has not been clinically validated in patients less than 66 years old.   For additional information please refer to http://education.[facility].com/faq/FAQ106.  (This link is being provided for informational/educational purposes only.)     RPR     Status: None (Preliminary result)   Collection Time: 09/28/15  5:37 PM  Result Value Ref Range   RPR Ser Ql  NON REAC  POCT CBC     Status: Abnormal   Collection Time: 09/28/15  5:44 PM  Result Value Ref Range   WBC 5.1 4.6 - 10.2 K/uL   Lymph, poc 2.1 0.6 - 3.4   POC LYMPH PERCENT 41.6 10 - 50 %L   MID (cbc) 0.2 0 - 0.9   POC MID % 4.4 0 - 12 %M   POC Granulocyte 2.8 2 - 6.9   Granulocyte percent 54.0 37 - 80 %G   RBC 4.31 4.04 - 5.48 M/uL   Hemoglobin 12.3 12.2 - 16.2 g/dL   HCT, POC 36.4 (A) 37.7 - 47.9 %   MCV 84.4 80 - 97 fL   MCH, POC 28.5 27 - 31.2 pg   MCHC 33.8 31.8 - 35.4 g/dL   RDW, POC 14.8 %   Platelet Count, POC 323 142 - 424 K/uL   MPV 7.3 0 - 99.8 fL  POCT glycosylated hemoglobin (Hb A1C)     Status: Normal   Collection Time: 09/28/15  5:51 PM  Result Value Ref Range   Hemoglobin A1C 5.9     ASSESSMENT AND PLAN  Melanie Roberts was seen today for allergic reaction, hand pain, urticaria and edema.  Diagnoses and all orders for this visit:  Idiopathic urticaria Comments: This has been a problem for some time now, and worse now that she is off of her antihistamine regimen for the purpose of allergy testing.  Advised 50 mg diphenhydramine for now given short half life.  Follow up with allergist in 48 hours.   Orders: -     POCT CBC -     POCT glycosylated hemoglobin (Hb A1C) -     COMPLETE METABOLIC PANEL WITH GFR -      HIV antibody -     RPR -     predniSONE (DELTASONE) 20 MG tablet; Take 3 in the morning for three days, then 2 in the morning for three days, and one in the morning for 3 days.  Swelling of upper lip  The patient was advised to call or return to clinic if she does not see an improvement in symptoms or to seek the care of the closest emergency department if she worsens with the above plan.   Philis Fendt, MHS, PA-C [facility] 09/29/2015 2:31 PM

## 2015-09-29 DIAGNOSIS — L501 Idiopathic urticaria: Secondary | ICD-10-CM | POA: Insufficient documentation

## 2015-09-29 LAB — COMPLETE METABOLIC PANEL WITH GFR
ALT: 10 U/L (ref 6–29)
AST: 13 U/L (ref 10–35)
Albumin: 3.9 g/dL (ref 3.6–5.1)
Alkaline Phosphatase: 35 U/L (ref 33–115)
BUN: 11 mg/dL (ref 7–25)
CHLORIDE: 104 mmol/L (ref 98–110)
CO2: 22 mmol/L (ref 20–31)
Calcium: 9.2 mg/dL (ref 8.6–10.2)
Creat: 0.65 mg/dL (ref 0.50–1.10)
GFR, Est Non African American: >89 mL/min (ref >=60)
Glucose, Bld: 96 mg/dL (ref 65–99)
Potassium: 3.9 mmol/L (ref 3.5–5.3)
Sodium: 138 mmol/L (ref 135–146)
TOTAL PROTEIN: 6.7 g/dL (ref 6.1–8.1)
Total Bilirubin: 0.6 mg/dL (ref 0.2–1.2)

## 2015-09-29 LAB — HIV ANTIBODY (ROUTINE TESTING W REFLEX): HIV: NONREACTIVE

## 2015-09-30 LAB — RPR

## 2015-10-07 ENCOUNTER — Encounter: Payer: Self-pay | Admitting: Family Medicine

## 2016-01-31 ENCOUNTER — Ambulatory Visit (INDEPENDENT_AMBULATORY_CARE_PROVIDER_SITE_OTHER): Payer: Federal, State, Local not specified - PPO | Admitting: Physician Assistant

## 2016-01-31 VITALS — BP 124/82 | HR 80 | Temp 97.7°F | Resp 18 | Ht 65.5 in | Wt 213.6 lb

## 2016-01-31 DIAGNOSIS — M7661 Achilles tendinitis, right leg: Secondary | ICD-10-CM

## 2016-01-31 MED ORDER — MELOXICAM 15 MG PO TABS: 15.0000 mg | ORAL_TABLET | Freq: Every day | ORAL | Status: DC

## 2016-01-31 NOTE — Patient Instructions (Addendum)
IF you received an x-ray today, you will receive an invoice from [facility]. Please contact [facility] at [phone] with questions or concerns regarding your invoice.   IF you received labwork today, you will receive an invoice from [employer]. Please contact [facility] at [phone] with questions or concerns regarding your invoice.   Our billing staff will not be able to assist you with questions regarding bills from these companies.  You will be contacted with the lab results as soon as they are available. The fastest way to get your results is to activate your My Chart account. Instructions are located on the last page of this paperwork. If you have not heard from [place] regarding the results in 2 weeks, please contact this office.     Achilles Tendinitis With Rehab Achilles tendinitis is a disorder of the Achilles tendon. The Achilles tendon connects the large calf muscles (Gastrocnemius and Soleus) to the heel bone (calcaneus). This tendon is sometimes called the heel cord. It is important for pushing-off and standing on your toes and is important for walking, running, or jumping. Tendinitis is often caused by overuse and repetitive microtrauma. SYMPTOMS  Pain, tenderness, swelling, warmth, and redness may occur over the Achilles tendon even at rest.  Pain with pushing off, or flexing or extending the ankle.  Pain that is worsened after or during activity. CAUSES   Overuse sometimes seen with rapid increase in exercise programs or in sports requiring running and jumping.  Poor physical conditioning (strength and flexibility or endurance).  Running sports, especially training running down hills.  Inadequate warm-up before practice or play or failure to stretch before participation.  Injury to the tendon. PREVENTION   Warm up and stretch before practice or competition.  Allow time for adequate rest and recovery between  practices and competition.  Keep up conditioning.  Keep up ankle and leg flexibility.  Improve or keep muscle strength and endurance.  Improve cardiovascular fitness.  Use proper technique.  Use proper equipment (shoes, skates).  To help prevent recurrence, taping, protective strapping, or an adhesive bandage may be recommended for several weeks after healing is complete. PROGNOSIS   Recovery may take weeks to several months to heal.  Longer recovery is expected if symptoms have been prolonged.  Recovery is usually quicker if the inflammation is due to a direct blow as compared with overuse or sudden strain. RELATED COMPLICATIONS   Healing time will be prolonged if the condition is not correctly treated. The injury must be given plenty of time to heal.  Symptoms can reoccur if activity is resumed too soon.  Untreated, tendinitis may increase the risk of tendon rupture requiring additional time for recovery and possibly surgery. TREATMENT   The first treatment consists of rest anti-inflammatory medication, and ice to relieve the pain.  Stretching and strengthening exercises after resolution of pain will likely help reduce the risk of recurrence. Referral to a physical therapist or athletic trainer for further evaluation and treatment may be helpful.  A walking boot or cast may be recommended to rest the Achilles tendon. This can help break the cycle of inflammation and microtrauma.  Arch supports (orthotics) may be prescribed or recommended by your caregiver as an adjunct to therapy and rest.  Surgery to remove the inflamed tendon lining or degenerated tendon tissue is rarely necessary and has shown less than predictable results. MEDICATION   Nonsteroidal anti-inflammatory medications, such as aspirin and ibuprofen, may be used for pain  and inflammation relief. Do not take within 7 days before surgery. Take these as directed by your caregiver. Contact your caregiver  immediately if any bleeding, stomach upset, or signs of allergic reaction occur. Other minor pain relievers, such as acetaminophen, may also be used.  Pain relievers may be prescribed as necessary by your caregiver. Do not take prescription pain medication for longer than 4 to 7 days. Use only as directed and only as much as you need.  Cortisone injections are rarely indicated. Cortisone injections may weaken tendons and predispose to rupture. It is better to give the condition more time to heal than to use them. HEAT AND COLD  Cold is used to relieve pain and reduce inflammation for acute and chronic Achilles tendinitis. Cold should be applied for 10 to 15 minutes every 2 to 3 hours for inflammation and pain and immediately after any activity that aggravates your symptoms. Use ice packs or an ice massage.  Heat may be used before performing stretching and strengthening activities prescribed by your caregiver. Use a heat pack or a warm soak. SEEK MEDICAL CARE IF:  Symptoms get worse or do not improve in 2 weeks despite treatment.  New, unexplained symptoms develop. Drugs used in treatment may produce side effects. EXERCISES RANGE OF MOTION (ROM) AND STRETCHING EXERCISES - Achilles Tendinitis  These exercises may help you when beginning to rehabilitate your injury. Your symptoms may resolve with or without further involvement from your physician, physical therapist or athletic trainer. While completing these exercises, remember:   Restoring tissue flexibility helps normal motion to return to the joints. This allows healthier, less painful movement and activity.  An effective stretch should be held for at least 30 seconds.  A stretch should never be painful. You should only feel a gentle lengthening or release in the stretched tissue. STRETCH - Gastroc, Standing   Place hands on wall.  Extend right / left leg, keeping the front knee somewhat bent.  Slightly point your toes inward on your  back foot.  Keeping your right / left heel on the floor and your knee straight, shift your weight toward the wall, not allowing your back to arch.  You should feel a gentle stretch in the right / left calf. Hold this position for __________ seconds. Repeat __________ times. Complete this stretch __________ times per day. STRETCH - Soleus, Standing   Place hands on wall.  Extend right / left leg, keeping the other knee somewhat bent.  Slightly point your toes inward on your back foot.  Keep your right / left heel on the floor, bend your back knee, and slightly shift your weight over the back leg so that you feel a gentle stretch deep in your back calf.  Hold this position for __________ seconds. Repeat __________ times. Complete this stretch __________ times per day. STRETCH - Gastrocsoleus, Standing  Note: This exercise can place a lot of stress on your foot and ankle. Please complete this exercise only if specifically instructed by your caregiver.   Place the ball of your right / left foot on a step, keeping your other foot firmly on the same step.  Hold on to the wall or a rail for balance.  Slowly lift your other foot, allowing your body weight to press your heel down over the edge of the step.  You should feel a stretch in your right / left calf.  Hold this position for __________ seconds.  Repeat this exercise with a slight bend in your  knee. Repeat __________ times. Complete this stretch __________ times per day.  STRENGTHENING EXERCISES - Achilles Tendinitis These exercises may help you when beginning to rehabilitate your injury. They may resolve your symptoms with or without further involvement from your physician, physical therapist or athletic trainer. While completing these exercises, remember:   Muscles can gain both the endurance and the strength needed for everyday activities through controlled exercises.  Complete these exercises as instructed by your physician,  physical therapist or athletic trainer. Progress the resistance and repetitions only as guided.  You may experience muscle soreness or fatigue, but the pain or discomfort you are trying to eliminate should never worsen during these exercises. If this pain does worsen, stop and make certain you are following the directions exactly. If the pain is still present after adjustments, discontinue the exercise until you can discuss the trouble with your clinician. STRENGTH - Plantar-flexors   Sit with your right / left leg extended. Holding onto both ends of a rubber exercise band/tubing, loop it around the ball of your foot. Keep a slight tension in the band.  Slowly push your toes away from you, pointing them downward.  Hold this position for __________ seconds. Return slowly, controlling the tension in the band/tubing. Repeat __________ times. Complete this exercise __________ times per day.  STRENGTH - Plantar-flexors   Stand with your feet shoulder width apart. Steady yourself with a wall or table using as little support as needed.  Keeping your weight evenly spread over the width of your feet, rise up on your toes.*  Hold this position for __________ seconds. Repeat __________ times. Complete this exercise __________ times per day.  *If this is too easy, shift your weight toward your right / left leg until you feel challenged. Ultimately, you may be asked to do this exercise with your right / left foot only. STRENGTH - Plantar-flexors, Eccentric  Note: This exercise can place a lot of stress on your foot and ankle. Please complete this exercise only if specifically instructed by your caregiver.   Place the balls of your feet on a step. With your hands, use only enough support from a wall or rail to keep your balance.  Keep your knees straight and rise up on your toes.  Slowly shift your weight entirely to your right / left toes and pick up your opposite foot. Gently and with controlled  movement, lower your weight through your right / left foot so that your heel drops below the level of the step. You will feel a slight stretch in the back of your calf at the end position.  Use the healthy leg to help rise up onto the balls of both feet, then lower weight only on the right / left leg again. Build up to 15 repetitions. Then progress to 3 consecutive sets of 15 repetitions.*  After completing the above exercise, complete the same exercise with a slight knee bend (about 30 degrees). Again, build up to 15 repetitions. Then progress to 3 consecutive sets of 15 repetitions.* Perform this exercise __________ times per day.  *When you easily complete 3 sets of 15, your physician, physical therapist or athletic trainer may advise you to add resistance by wearing a backpack filled with additional weight. STRENGTH - Plantar Flexors, Seated   Sit on a chair that allows your feet to rest flat on the ground. If necessary, sit at the edge of the chair.  Keeping your toes firmly on the ground, lift your right / left  heel as far as you can without increasing any discomfort in your ankle. Repeat __________ times. Complete this exercise __________ times a day. *If instructed by your physician, physical therapist or athletic trainer, you may add ____________________ of resistance by placing a weighted object on your right / left knee.   This information is not intended to replace advice given to you by your health care provider. Make sure you discuss any questions you have with your health care provider.   Document Released: 05/10/2005 Document Revised: 10/30/2014 Document Reviewed: 01/21/2009 Elsevier Interactive Patient Education [employer].

## 2016-02-07 ENCOUNTER — Ambulatory Visit (INDEPENDENT_AMBULATORY_CARE_PROVIDER_SITE_OTHER): Payer: Federal, State, Local not specified - PPO | Admitting: Physician Assistant

## 2016-02-07 VITALS — BP 124/82 | HR 72 | Temp 98.2°F | Resp 18 | Ht 67.0 in | Wt 211.0 lb

## 2016-02-07 DIAGNOSIS — M7661 Achilles tendinitis, right leg: Secondary | ICD-10-CM | POA: Diagnosis not present

## 2016-02-07 NOTE — Patient Instructions (Addendum)
     IF you received an x-ray today, you will receive an invoice from [facility]. Please contact [facility] at [phone] with questions or concerns regarding your invoice.   IF you received labwork today, you will receive an invoice from [employer]. Please contact [facility] at [phone] with questions or concerns regarding your invoice.   Our billing staff will not be able to assist you with questions regarding bills from these companies.  You will be contacted with the lab results as soon as they are available. The fastest way to get your results is to activate your My Chart account. Instructions are located on the last page of this paperwork. If you have not heard from [place] regarding the results in 2 weeks, please contact this office.     We are going to continue the restrictions at this time.  Please try to ice the area three times per day.  And perform the stretches as before.

## 2016-02-07 NOTE — Progress Notes (Signed)
[facility] [address], [place] [address] [phone]  Date:  02/07/2016   Name:  Melanie Roberts   DOB:  10-15-1971   MRN:  [id number]  PCP:  Benito Mccreedy, MD   Chief Complaint  Patient presents with  . Follow-up    Right leg    History of Present Illness:  Melanie Roberts is a 45 y.o. female patient who presents to [facility] follow up of right ankle pain, which appears to be achilles tendinitis.    This has vastly improved within the last few days.  She reports only rare occasions of pain, if she is walking for too long.  She is stretching, nsaid, ice, and using shoe lift.  She has no swelling.  No numbness, tingling, or weakness.  She feels as if she is comfortable to start work again.    Patient Active Problem List   Diagnosis Date Noted  . Idiopathic urticaria 09/29/2015    Past Medical History  Diagnosis Date  . Hypothyroidism   . Wears partial dentures     upper and lower  . Headache(784.0)     migraines  . Hemorrhoids 12/2012    Past Surgical History  Procedure Laterality Date  . Tonsillectomy and adenoidectomy  age 70  . Myomectomy  12/18/2001    x 4  . Tubal ligation  07/08/2003  . Cesarean section  07/08/2003  . Adenoidectomy  02/17/2005  . Nasal turbinate reduction Bilateral 02/07/2005    inferior  . Laparoscopic total hysterectomy  10/29/2006  . Evaluation under anesthesia with hemorrhoidectomy N/A 01/14/2013    Procedure: EXAM UNDER ANESTHESIA WITH HEMORRHOIDECTOMY POSSIBLE BANDING;  Surgeon: Madilyn Hook, DO;  Location: [place];  Service: General;  Laterality: N/A;    Social History  Substance Use Topics  . Smoking status: Never Smoker   . Smokeless tobacco: Never Used  . Alcohol Use: 0.0 oz/week    0 Standard drinks or equivalent per week     Comment: occasionally    Family History  Problem Relation Age of Onset  . Diabetes Mother   . Cancer Father     Allergies  Allergen Reactions  .  Bee Venom Swelling    Medication list has been reviewed and updated.  Current Outpatient Prescriptions on File Prior to Visit  Medication Sig Dispense Refill  . levothyroxine (SYNTHROID, LEVOTHROID) 150 MCG tablet Take 161 mcg by mouth daily before breakfast.     . meloxicam (MOBIC) 15 MG tablet Take 1 tablet (15 mg total) by mouth daily. 14 tablet 0  . Multiple Vitamin (MULTIVITAMIN) tablet Take 1 tablet by mouth daily.    Marland Kitchen docusate sodium (COLACE) 100 MG capsule Take 1 capsule (100 mg total) by mouth 2 (two) times daily. (Patient not taking: Reported on 09/28/2015) 90 capsule 0  . levothyroxine (SYNTHROID, LEVOTHROID) 175 MCG tablet Take 175 mcg by mouth daily before breakfast. Reported on 02/07/2016    . Naproxen Sodium (ALEVE PO) Take by mouth. Reported on 02/07/2016     No current facility-administered medications on file prior to visit.    ROS ROS otherwise unremarkable unless listed above.    Physical Examination: BP 124/82 mmHg  Pulse 72  Temp(Src) 98.2 F (36.8 Melanie) (Oral)  Resp 18  Ht 5\' 7"  (1.702 m)  Wt 211 lb (95.709 kg)  BMI 33.04 kg/m2  SpO2 96% Ideal Body Weight: Weight in (lb) to have BMI = 25: 159.3  Physical Exam  Constitutional: She is oriented  to person, place, and time. She appears well-developed and well-nourished. No distress.  HENT:  Head: Normocephalic and atraumatic.  Right Ear: External ear normal.  Left Ear: External ear normal.  Eyes: Conjunctivae and EOM are normal. Pupils are equal, round, and reactive to light.  Cardiovascular: Normal rate.   Pulmonary/Chest: Effort normal. No respiratory distress.  Musculoskeletal:       Right ankle: She exhibits normal range of motion and no swelling. No tenderness. Achilles tendon normal. Achilles tendon exhibits no pain, no defect and normal Thompson's test results.  Neurological: She is alert and oriented to person, place, and time.  Skin: She is not diaphoretic.  Psychiatric: She has a normal mood and  affect. Her behavior is normal.     Assessment and Plan: Melanie Roberts is a 45 y.o. female who is here today for follow up of achilles tendinitis. She will return to work.  Advised prn nsaid use only, icing directly after, stretches, and lift. Achilles tendinitis of right lower extremity  Ivar Drape, PA-Melanie [facility] 02/07/2016 5:33 PM

## 2016-02-14 ENCOUNTER — Ambulatory Visit (INDEPENDENT_AMBULATORY_CARE_PROVIDER_SITE_OTHER): Payer: Federal, State, Local not specified - PPO

## 2016-02-15 ENCOUNTER — Telehealth: Payer: Self-pay

## 2016-02-15 ENCOUNTER — Ambulatory Visit (INDEPENDENT_AMBULATORY_CARE_PROVIDER_SITE_OTHER): Payer: Federal, State, Local not specified - PPO | Admitting: Physician Assistant

## 2016-02-15 VITALS — BP 132/80 | HR 70 | Temp 98.7°F | Resp 16 | Ht 67.0 in | Wt 215.0 lb

## 2016-02-15 DIAGNOSIS — M7661 Achilles tendinitis, right leg: Secondary | ICD-10-CM

## 2016-02-15 NOTE — Progress Notes (Signed)
[facility] [address] [place] [address] [phone]  Date:  02/15/2016   Name:  Melanie Roberts   DOB:  1971/04/20   MRN:  [id number]  PCP:  Benito Mccreedy, MD    History of Present Illness:  Melanie Roberts is a 45 y.o. female patient who presents to [facility] for follow up of achilles tendinitis.  Patient is here today for follow up of tendinitis.  This is week 2.  She states that the pain has resolved.  He continues to do stretches and ice three times per day.  She wears the shoe lift daily.  She has no numbness or tingling.  No redness.         Patient Active Problem List   Diagnosis Date Noted  . Idiopathic urticaria 09/29/2015    Past Medical History  Diagnosis Date  . Hypothyroidism   . Wears partial dentures     upper and lower  . Headache(784.0)     migraines  . Hemorrhoids 12/2012    Past Surgical History  Procedure Laterality Date  . Tonsillectomy and adenoidectomy  age 77  . Myomectomy  12/18/2001    x 4  . Tubal ligation  07/08/2003  . Cesarean section  07/08/2003  . Adenoidectomy  02/17/2005  . Nasal turbinate reduction Bilateral 02/07/2005    inferior  . Laparoscopic total hysterectomy  10/29/2006  . Evaluation under anesthesia with hemorrhoidectomy N/A 01/14/2013    Procedure: EXAM UNDER ANESTHESIA WITH HEMORRHOIDECTOMY POSSIBLE BANDING;  Surgeon: Madilyn Hook, DO;  Location: [place];  Service: General;  Laterality: N/A;    Social History  Substance Use Topics  . Smoking status: Never Smoker   . Smokeless tobacco: Never Used  . Alcohol Use: 0.0 oz/week    0 Standard drinks or equivalent per week     Comment: occasionally    Family History  Problem Relation Age of Onset  . Diabetes Mother   . Cancer Father     Allergies  Allergen Reactions  . Bee Venom Swelling    Medication list has been reviewed and updated.  Current Outpatient Prescriptions on File Prior to Visit  Medication Sig  Dispense Refill  . levothyroxine (SYNTHROID, LEVOTHROID) 150 MCG tablet Take 161 mcg by mouth daily before breakfast.     . levothyroxine (SYNTHROID, LEVOTHROID) 175 MCG tablet Take 175 mcg by mouth daily before breakfast. Reported on 02/07/2016    . Multiple Vitamin (MULTIVITAMIN) tablet Take 1 tablet by mouth daily.    . Naproxen Sodium (ALEVE PO) Take by mouth. Reported on 02/07/2016    . docusate sodium (COLACE) 100 MG capsule Take 1 capsule (100 mg total) by mouth 2 (two) times daily. (Patient not taking: Reported on 09/28/2015) 90 capsule 0  . meloxicam (MOBIC) 15 MG tablet Take 1 tablet (15 mg total) by mouth daily. (Patient not taking: Reported on 02/15/2016) 14 tablet 0   No current facility-administered medications on file prior to visit.    ROS ROS otherwise unremarkable unless listed above.   Physical Examination: BP 132/80 mmHg  Pulse 70  Temp(Src) 98.7 F (37.1 C) (Oral)  Resp 16  Ht 5\' 7"  (1.702 m)  Wt 215 lb (97.523 kg)  BMI 33.67 kg/m2  SpO2 96% Ideal Body Weight: Weight in (lb) to have BMI = 25: 159.3  Physical Exam  Constitutional: She is oriented to person, place, and time. She appears well-developed and well-nourished. No distress.  HENT:  Head: Normocephalic and atraumatic.  Right Ear: External ear normal.  Left Ear: External ear normal.  Eyes: Conjunctivae and EOM are normal. Pupils are equal, round, and reactive to light.  Cardiovascular: Normal rate.   Pulmonary/Chest: Effort normal. No respiratory distress.  Musculoskeletal:       Right ankle: She exhibits normal range of motion and no swelling. No tenderness. No lateral malleolus and no medial malleolus tenderness found. Achilles tendon normal. Achilles tendon exhibits no pain and normal Thompson's test results.  Can stand dorsiflexion and plantarflexion without pain.  Neurological: She is alert and oriented to person, place, and time.  Skin: She is not diaphoretic.  Psychiatric: She has a normal mood  and affect. Her behavior is normal.     Assessment and Plan: Alayiah Bartell is a 45 y.o. female who is here today for followup of achilles tendinitis. This has improved.  i will give her meloxicam as needed basis.   I have advised that she continue to ice and stretch.  She will use shoe lift at work.   Achilles tendinitis of right lower extremity  Melanie Drape, PA-C [facility] 02/15/2016 12:22 PM

## 2016-02-15 NOTE — Patient Instructions (Signed)
     IF you received an x-ray today, you will receive an invoice from [facility]. Please contact [facility] at [phone] with questions or concerns regarding your invoice.   IF you received labwork today, you will receive an invoice from [facility]. Please contact [facility] at [phone] with questions or concerns regarding your invoice.   Our billing staff will not be able to assist you with questions regarding bills from these companies.  You will be contacted with the lab results as soon as they are available. The fastest way to get your results is to activate your My Chart account. Instructions are located on the last page of this paperwork. If you have not heard from us regarding the results in 2 weeks, please contact this office.      

## 2016-02-15 NOTE — Telephone Encounter (Signed)
Patient states she is supposed to have been sent a prescription for meloxicam.

## 2016-02-16 MED ORDER — MELOXICAM 15 MG PO TABS: 15.0000 mg | ORAL_TABLET | Freq: Every day | ORAL | Status: DC

## 2016-02-16 NOTE — Telephone Encounter (Signed)
Stephanie's OV notes state that she is going to continue the meloxicam for pt. I sent in RFs and notified pt.

## 2016-02-20 NOTE — Progress Notes (Signed)
[facility] [address], [place]  [address] [phone]  Date:  01/31/2016   Name:  Melanie Roberts   DOB:  11-Nov-1970   MRN:  [id number]  PCP:  Benito Mccreedy, MD   Chief Complaint  Patient presents with  . Heel pain    Rt foot, x 2 weeks     History of Present Illness:  Melanie Roberts is a 45 y.o. female patient who presents to [facility] for chief complaint of right ankle pain.   Patient reports progressively worsening right ankle pain for the last 2 weeks. This pain radiates from near her heel up toward the calf. It is aggravated by walking, standing, climbing stairs, and been taking down/stooping. She works with the [employer] and is constantly walking on hard pavement.  She uses shoes as instructed from her work and sneakers that offer good cushioning.  The insole is relatively new (6 months). No recent new medications.  Patient Active Problem List   Diagnosis Date Noted  . Idiopathic urticaria 09/29/2015    Past Medical History  Diagnosis Date  . Hypothyroidism   . Wears partial dentures     upper and lower  . Headache(784.0)     migraines  . Hemorrhoids 12/2012    Past Surgical History  Procedure Laterality Date  . Tonsillectomy and adenoidectomy  age 59  . Myomectomy  12/18/2001    x 4  . Tubal ligation  07/08/2003  . Cesarean section  07/08/2003  . Adenoidectomy  02/17/2005  . Nasal turbinate reduction Bilateral 02/07/2005    inferior  . Laparoscopic total hysterectomy  10/29/2006  . Evaluation under anesthesia with hemorrhoidectomy N/A 01/14/2013    Procedure: EXAM UNDER ANESTHESIA WITH HEMORRHOIDECTOMY POSSIBLE BANDING;  Surgeon: Madilyn Hook, DO;  Location: [facility];  Service: General;  Laterality: N/A;    Social History  Substance Use Topics  . Smoking status: Never Smoker   . Smokeless tobacco: Never Used  . Alcohol Use: 0.0 oz/week    0 Standard drinks or equivalent per week     Comment:  occasionally    Family History  Problem Relation Age of Onset  . Diabetes Mother   . Cancer Father     Allergies  Allergen Reactions  . Bee Venom Swelling    Medication list has been reviewed and updated.  Current Outpatient Prescriptions on File Prior to Visit  Medication Sig Dispense Refill  . levothyroxine (SYNTHROID, LEVOTHROID) 150 MCG tablet Take 161 mcg by mouth daily before breakfast.     . docusate sodium (COLACE) 100 MG capsule Take 1 capsule (100 mg total) by mouth 2 (two) times daily. (Patient not taking: Reported on 09/28/2015) 90 capsule 0  . levothyroxine (SYNTHROID, LEVOTHROID) 175 MCG tablet Take 175 mcg by mouth daily before breakfast. Reported on 02/07/2016     No current facility-administered medications on file prior to visit.    ROS ROS otherwise unremarkable unless listed above.  Physical Examination: BP 124/82 mmHg  Pulse 80  Temp(Src) 97.7 F (36.5 C) (Oral)  Resp 18  Ht 5' 5.5" (1.664 m)  Wt 213 lb 9.6 oz (96.888 kg)  BMI 34.99 kg/m2  SpO2 98% Ideal Body Weight: Weight in (lb) to have BMI = 25: 152.2  Physical Exam  Constitutional: She is oriented to person, place, and time. She appears well-developed and well-nourished. No distress.  HENT:  Head: Normocephalic and atraumatic.  Right Ear: External ear normal.  Left Ear: External ear normal.  Eyes: Conjunctivae and EOM are normal. Pupils are equal, round, and reactive to light.  Cardiovascular: Normal rate.   Pulmonary/Chest: Effort normal. No respiratory distress.  Musculoskeletal:  Right ankle with minimal swelling just posterior to the lateral malleolus.  No erythema or warmth.  rom limited with dorsiflexion and inversion.  Sensation intact.  Normal pulses.  Tender to lateral side of distal achilles tendon.  Negative thompson test.  Neurological: She is alert and oriented to person, place, and time.  Skin: She is not diaphoretic.  Psychiatric: She has a normal mood and affect. Her  behavior is normal.    Assessment and Plan: Melanie Roberts is a 45 y.o. female who is here today for cc of heel pain.   -advised ice, stretches and anti inflammatory use.  She will return in 1 week for follow up.  She wishes to beout of work at this time.  Her job specificities may complicate this, and letter was written to restrict of her duties.  Achilles tendinitis of right lower extremity - Plan: DISCONTINUED: meloxicam (MOBIC) 15 MG tablet  Ivar Drape, PA-C [facility] 02/20/2016 1:02 PM

## 2016-04-12 ENCOUNTER — Other Ambulatory Visit: Payer: Self-pay | Admitting: Physician Assistant

## 2016-04-20 DIAGNOSIS — E038 Other specified hypothyroidism: Secondary | ICD-10-CM | POA: Diagnosis not present

## 2016-04-21 DIAGNOSIS — E7800 Pure hypercholesterolemia, unspecified: Secondary | ICD-10-CM | POA: Diagnosis not present

## 2016-04-21 DIAGNOSIS — E038 Other specified hypothyroidism: Secondary | ICD-10-CM | POA: Diagnosis not present

## 2016-05-14 ENCOUNTER — Observation Stay (HOSPITAL_COMMUNITY)
Admission: EM | Admit: 2016-05-14 | Discharge: 2016-05-15 | Disposition: A | Discharge status: Home / Self Care | Payer: Federal, State, Local not specified - PPO | Attending: Pulmonary Disease | Admitting: Pulmonary Disease

## 2016-05-14 ENCOUNTER — Encounter (HOSPITAL_COMMUNITY): Payer: Self-pay

## 2016-05-14 DIAGNOSIS — E039 Hypothyroidism, unspecified: Secondary | ICD-10-CM | POA: Diagnosis not present

## 2016-05-14 DIAGNOSIS — T368X5A Adverse effect of other systemic antibiotics, initial encounter: Secondary | ICD-10-CM | POA: Insufficient documentation

## 2016-05-14 DIAGNOSIS — Z91030 Bee allergy status: Secondary | ICD-10-CM | POA: Diagnosis present

## 2016-05-14 DIAGNOSIS — R220 Localized swelling, mass and lump, head: Secondary | ICD-10-CM | POA: Insufficient documentation

## 2016-05-14 DIAGNOSIS — T782XXA Anaphylactic shock, unspecified, initial encounter: Secondary | ICD-10-CM | POA: Diagnosis present

## 2016-05-14 DIAGNOSIS — T886XXA Anaphylactic reaction due to adverse effect of correct drug or medicament properly administered, initial encounter: Principal | ICD-10-CM | POA: Insufficient documentation

## 2016-05-14 DIAGNOSIS — Y848 Other medical procedures as the cause of abnormal reaction of the patient, or of later complication, without mention of misadventure at the time of the procedure: Secondary | ICD-10-CM | POA: Insufficient documentation

## 2016-05-14 DIAGNOSIS — T368X1A Poisoning by other systemic antibiotics, accidental (unintentional), initial encounter: Secondary | ICD-10-CM | POA: Diagnosis not present

## 2016-05-14 DIAGNOSIS — T7840XA Allergy, unspecified, initial encounter: Secondary | ICD-10-CM

## 2016-05-14 LAB — CBC WITH DIFFERENTIAL/PLATELET
BASOS PCT: 1 %
Basophils Absolute: 0 10*3/uL (ref 0.0–0.1)
Eosinophils Absolute: 0.2 10*3/uL (ref 0.0–0.7)
Eosinophils Relative: 3 %
HEMATOCRIT: 39.7 % (ref 36.0–46.0)
HEMOGLOBIN: 12.6 g/dL (ref 12.0–15.0)
LYMPHS ABS: 3.2 10*3/uL (ref 0.7–4.0)
Lymphocytes Relative: 53 %
MCH: 28.4 pg (ref 26.0–34.0)
MCHC: 31.7 g/dL (ref 30.0–36.0)
MCV: 89.4 fL (ref 78.0–100.0)
MONOS PCT: 8 %
Monocytes Absolute: 0.5 10*3/uL (ref 0.1–1.0)
NEUTROS ABS: 2.2 10*3/uL (ref 1.7–7.7)
NEUTROS PCT: 37 %
Platelets: 319 10*3/uL (ref 150–400)
RBC: 4.44 MIL/uL (ref 3.87–5.11)
RDW: 13.7 % (ref 11.5–15.5)
WBC: 6.1 10*3/uL (ref 4.0–10.5)

## 2016-05-14 LAB — BASIC METABOLIC PANEL
Anion gap: 5 (ref 5–15)
BUN: 13 mg/dL (ref 6–20)
CALCIUM: 9.7 mg/dL (ref 8.9–10.3)
CO2: 28 mmol/L (ref 22–32)
CREATININE: 0.84 mg/dL (ref 0.44–1.00)
Chloride: 106 mmol/L (ref 101–111)
Glucose, Bld: 150 mg/dL — ABNORMAL HIGH (ref 65–99)
Potassium: 3 mmol/L — ABNORMAL LOW (ref 3.5–5.1)
SODIUM: 139 mmol/L (ref 135–145)

## 2016-05-14 LAB — MRSA PCR SCREENING: MRSA by PCR: NEGATIVE

## 2016-05-14 LAB — PHOSPHORUS: PHOSPHORUS: 2.7 mg/dL (ref 2.5–4.6)

## 2016-05-14 LAB — MAGNESIUM: Magnesium: 1.9 mg/dL (ref 1.7–2.4)

## 2016-05-14 LAB — GLUCOSE, CAPILLARY: Glucose-Capillary: 211 mg/dL — ABNORMAL HIGH (ref 65–99)

## 2016-05-14 MED ORDER — EPINEPHRINE 0.3 MG/0.3ML IJ SOAJ
0.3000 mg | Freq: Once | INTRAMUSCULAR | Status: AC
  Administered 2016-05-14: 0.3 mg via INTRAMUSCULAR
  Filled 2016-05-14: qty 0.3

## 2016-05-14 MED ORDER — ONDANSETRON HCL 4 MG/2ML IJ SOLN
4.0000 mg | Freq: Once | INTRAMUSCULAR | Status: AC
  Administered 2016-05-14: 4 mg via INTRAVENOUS
  Filled 2016-05-14: qty 2

## 2016-05-14 MED ORDER — METHYLPREDNISOLONE SODIUM SUCC 40 MG IJ SOLR
40.0000 mg | Freq: Two times a day (BID) | INTRAMUSCULAR | Status: DC
  Administered 2016-05-15: 40 mg via INTRAVENOUS
  Filled 2016-05-14 (×2): qty 1

## 2016-05-14 MED ORDER — EPINEPHRINE 0.3 MG/0.3ML IJ SOAJ
0.3000 mg | Freq: Once | INTRAMUSCULAR | Status: DC
  Filled 2016-05-14: qty 0.3

## 2016-05-14 MED ORDER — DIPHENHYDRAMINE HCL 50 MG/ML IJ SOLN
25.0000 mg | Freq: Once | INTRAMUSCULAR | Status: AC
  Administered 2016-05-14: 25 mg via INTRAVENOUS

## 2016-05-14 MED ORDER — METHYLPREDNISOLONE SODIUM SUCC 125 MG IJ SOLR
125.0000 mg | Freq: Once | INTRAMUSCULAR | Status: AC
  Administered 2016-05-14: 125 mg via INTRAVENOUS
  Filled 2016-05-14: qty 2

## 2016-05-14 MED ORDER — LEVOTHYROXINE SODIUM 150 MCG PO TABS
150.0000 ug | ORAL_TABLET | Freq: Every day | ORAL | Status: DC
Start: 2016-05-15 — End: 2016-05-14

## 2016-05-14 MED ORDER — DIPHENHYDRAMINE HCL 25 MG PO TABS
25.0000 mg | ORAL_TABLET | Freq: Four times a day (QID) | ORAL | Status: DC | PRN
  Administered 2016-05-14: 25 mg via ORAL
  Filled 2016-05-14 (×2): qty 1

## 2016-05-14 MED ORDER — DIPHENHYDRAMINE HCL 25 MG PO CAPS: 25.0000 mg | ORAL_CAPSULE | Freq: Four times a day (QID) | ORAL | Status: DC | PRN

## 2016-05-14 MED ORDER — DIPHENHYDRAMINE HCL 50 MG/ML IJ SOLN
50.0000 mg | Freq: Once | INTRAMUSCULAR | Status: DC
  Filled 2016-05-14: qty 1

## 2016-05-14 MED ORDER — POTASSIUM CHLORIDE CRYS ER 20 MEQ PO TBCR
40.0000 meq | EXTENDED_RELEASE_TABLET | Freq: Once | ORAL | Status: AC
  Administered 2016-05-14: 40 meq via ORAL
  Filled 2016-05-14: qty 2

## 2016-05-14 MED ORDER — EPINEPHRINE HCL 1 MG/ML IJ SOLN
0.5000 ug/min | INTRAVENOUS | Status: DC
  Administered 2016-05-14: 0.5 ug/min via INTRAVENOUS
  Filled 2016-05-14: qty 4

## 2016-05-14 MED ORDER — METHYLPREDNISOLONE SODIUM SUCC 40 MG IJ SOLR
40.0000 mg | Freq: Three times a day (TID) | INTRAMUSCULAR | Status: DC
  Administered 2016-05-14: 40 mg via INTRAVENOUS
  Filled 2016-05-14 (×2): qty 1

## 2016-05-14 MED ORDER — FAMOTIDINE IN NACL 20-0.9 MG/50ML-% IV SOLN
20.0000 mg | Freq: Two times a day (BID) | INTRAVENOUS | Status: DC
  Administered 2016-05-14 – 2016-05-15 (×2): 20 mg via INTRAVENOUS
  Filled 2016-05-14 (×3): qty 50

## 2016-05-14 MED ORDER — LEVOTHYROXINE SODIUM 25 MCG PO TABS: 225.0000 ug | ORAL_TABLET | ORAL | Status: DC

## 2016-05-14 MED ORDER — FAMOTIDINE IN NACL 20-0.9 MG/50ML-% IV SOLN
20.0000 mg | Freq: Once | INTRAVENOUS | Status: AC
  Administered 2016-05-14: 20 mg via INTRAVENOUS
  Filled 2016-05-14: qty 50

## 2016-05-14 MED ORDER — SODIUM CHLORIDE 0.9 % IV SOLN
INTRAVENOUS | Status: DC
  Administered 2016-05-14: 17:00:00 via INTRAVENOUS

## 2016-05-14 MED ORDER — SODIUM CHLORIDE 0.9 % IV SOLN: 250.0000 mL | INTRAVENOUS | Status: DC | PRN

## 2016-05-14 MED ORDER — EPINEPHRINE HCL 1 MG/ML IJ SOLN: 0.5000 ug/min | INTRAVENOUS | Status: DC

## 2016-05-14 MED ORDER — LEVOTHYROXINE SODIUM 150 MCG PO TABS
150.0000 ug | ORAL_TABLET | ORAL | Status: DC
  Administered 2016-05-15: 150 ug via ORAL
  Filled 2016-05-14: qty 1

## 2016-05-14 NOTE — ED Notes (Signed)
Per pharmcy epi drip should be running at 107mcg/min per er md

## 2016-05-14 NOTE — ED Notes (Signed)
Attempted to call report x 1  

## 2016-05-14 NOTE — Progress Notes (Signed)
eLink Physician-Brief Progress Note Patient Name: Melanie Roberts DOB: November 02, 1970 MRN: [id number]   Date of Service  05/14/2016  HPI/Events of Note  K 3.0/ wants to eat/ sugars up  eICU Interventions  kdur 40 meq Carb modified diet Reduce steroids Not diabetic/ no insulin for now     Intervention Category Minor Interventions: Electrolytes abnormality - evaluation and management;Routine modifications to care plan (e.g. PRN medications for pain, fever)  Christinia Gully 05/14/2016, 8:58 PM

## 2016-05-14 NOTE — ED Notes (Signed)
MD at bedside. 

## 2016-05-14 NOTE — H&P (Signed)
Name: Melanie Roberts MRN: [id number] DOB: 12-18-1970    ADMISSION DATE:  05/14/2016 CONSULTATION DATE:  05/14/2016  REFERRING MD :  Thomasene Lot, EDP  CHIEF COMPLAINT:  Throat closing  BRIEF PATIENT DESCRIPTION: 45 year old woman presented to ED with anaphylaxis presumably due to ciprofloxacin   HISTORY OF PRESENT ILLNESS:  45 year old woman with hypothyroidism and "allergies" for which she takes cetirizine. She had an allergic reaction about a year ago to ciprofloxacin with facial swelling and she had some leftover tablets in the bottle. She was given ciprofloxacin then for tendinitis. She underwent allergy evaluation which was otherwise negative.  Her son had allergies yesterday and took to cetirizine bottle. She woke up this morning and took ciprofloxacin around 8:30 AM. Almost immediately she felt itchy and had hives all over her body. She had EpiPen sent home but did not take it, she took 2 Benadryl send her mom transported her to the emergency room She was noted to have diffuse urticaria and decreased air movement she also had significant facial swelling and swelling of her eyelids and felt like her throat was closing up. She received 2 doses of IM epinephrine and then was placed on epinephrine drip. After 2-3 hours she felt improved and her voice started coming back, the urticaria went away. We are asked to admit her since she is on epinephrine drip  PAST MEDICAL HISTORY :   has a past medical history of Headache(784.0); Hemorrhoids (12/2012); Hypothyroidism; and Wears partial dentures.  has a past surgical history that includes Tonsillectomy and adenoidectomy (age 40); Myomectomy (12/18/2001); Tubal ligation (07/08/2003); Cesarean section (07/08/2003); Adenoidectomy (02/17/2005); Nasal turbinate reduction (Bilateral, 02/07/2005); Laparoscopic total hysterectomy (10/29/2006); and Exam under anesthesia with hemorrhoidectomy (N/A, 01/14/2013). Prior to Admission medications   Medication Sig  Start Date End Date Taking? Authorizing Provider  cetirizine (ZYRTEC) 10 MG tablet Take 10 mg by mouth daily.   Yes Historical Provider, MD  diphenhydrAMINE (BENADRYL) 25 MG tablet Take 50 mg by mouth every 6 (six) hours as needed for allergies.   Yes Historical Provider, MD  GARCINIA CAMBOGIA-CHROMIUM PO Take 2 tablets by mouth daily.   Yes Historical Provider, MD  levothyroxine (SYNTHROID, LEVOTHROID) 150 MCG tablet Take 150-225 mcg by mouth daily before breakfast. 1104mcg daily except on Thursday and Sunday take 247mcg   Yes Historical Provider, MD  Misc Natural Products (COLON CLEANSE PO) Take 2 tablets by mouth daily.   Yes Historical Provider, MD  Multiple Vitamin (MULTIVITAMIN) tablet Take 1 tablet by mouth daily.   Yes Historical Provider, MD  ciprofloxacin (CIPRO) 750 MG tablet Take 750 mg by mouth 2 (two) times daily.    Historical Provider, MD   Allergies  Allergen Reactions  . Ciprofloxacin Swelling  . Bee Venom Swelling    FAMILY HISTORY:  family history includes Cancer in her father; Diabetes in her mother. SOCIAL HISTORY:  reports that she has never smoked. She has never used smokeless tobacco. She reports that she drinks alcohol. She reports that she does not use drugs.  REVIEW OF SYSTEMS:   Constitutional: Negative for fever, chills, weight loss, malaise/fatigue and diaphoresis.  HENT: Negative for hearing loss, ear pain, nosebleeds, congestion, sore throat, neck pain, tinnitus and ear discharge.   Eyes: Negative for blurred vision, double vision, photophobia, pain, discharge and redness.  Respiratory: Negative for cough, hemoptysis, sputum production,  Positive for shortness of breath, wheezing and stridor.   Cardiovascular: Negative for chest pain, palpitations, orthopnea, claudication, leg swelling and PND.  Gastrointestinal: Negative for heartburn,  nausea, vomiting, abdominal pain, diarrhea, constipation, blood in stool and melena.  Genitourinary: Negative for dysuria,  urgency, frequency, hematuria and flank pain.  Musculoskeletal: Negative for myalgias, back pain, joint pain and falls.  Skin: Positive for itching and rash.  Neurological: Negative for dizziness, tingling, tremors, sensory change, speech change, focal weakness, seizures, loss of consciousness, weakness and headaches.  Endo/Heme: Negative for environmental allergies and polydipsia. Does not bruise/bleed easily.  SUBJECTIVE:   VITAL SIGNS: Temp:  [97.9 F (36.6 C)] 97.9 F (36.6 C) (07/23 1049) Pulse Rate:  [82-119] 100 (07/23 1415) Resp:  [11-29] 23 (07/23 1415) BP: (103-125)/(65-85) 112/82 (07/23 1415) SpO2:  [97 %-100 %] 100 % (07/23 1415)  PHYSICAL EXAMINATION: General:  Acutely ill, able to speak in full sentences and provide me history, no respiratory distress Neuro:  Alert, interactive and non-focal HEENT:  Swelling of eyelids, no tongue or lip swelling, no JVD Cardiovascular:  S1-S2 normal Lungs:  Clear to auscultation no rhonchi Abdomen:  Soft nontender Musculoskeletal:  No deformity Skin:  No rash, she had urticaria with large hives especially over her thigh which have now resolved   Recent Labs Lab 05/14/16 1130  NA 139  K 3.0*  CL 106  CO2 28  BUN 13  CREATININE 0.84  GLUCOSE 150*    Recent Labs Lab 05/14/16 1130  HGB 12.6  HCT 39.7  WBC 6.1  PLT 319   No results found.  ASSESSMENT / PLAN:  Anaphylaxis -clearly her symptoms started after taking ciprofloxacin and this is the most likely culprit. She had an allergic reaction to facial swelling after taking ciprofloxacin one year ago  -We will continue epinephrine drip until all swelling subsides-then we can taper it by 0.5 g every 2 hours to off -Add Solu-Medrol 40 every 8 -Can take Benadryl 50 mg oral every 6 hours when necessary -Pepcid 20 mg every 12  This will be listed as a class allergy to fluoroquinolones. She has an epinephrine pen already at home-she was counseled on using this should her  symptoms ever recur in the future.  We will admit her for observation in the ICU for 24 hours, expect that if she resolves by tomorrow morning she can be discharged   Kara Mead MD. FCCP. [facility] Pager [phone] If no response call [phone]   05/14/2016     05/14/2016, 2:47 PM

## 2016-05-14 NOTE — ED Notes (Signed)
Attending md assessing patient, states we will leave her on epi drip for now and she may now have ice chips, patient states she is starting to feel better

## 2016-05-14 NOTE — ED Notes (Signed)
Pt reports still feeling like her throat is closing er md at bedside, epi drip titrated to 1.5 mcg/min per er md

## 2016-05-14 NOTE — ED Notes (Signed)
Patient presents to er with allergic reaction, patients eyes, mouth, and throat are swollen, with some labored breathing, patient also has hives on her thighs, er md at bedside

## 2016-05-14 NOTE — ED Provider Notes (Signed)
[facility] DEPT Provider Note   CSN: [id number] Arrival date & time: 05/14/16  1043  First Provider Contact:  None       History   Chief Complaint Chief Complaint  Patient presents with  . Allergic Reaction    HPI Melanie Roberts is a 45 y.o. female.  HPI   Pt is a 45 year old female here with allergic reaction.  Patient took Cipro this morning (she had it from prior infection, but it gave her tendonitis) by mistake when she meant to take certraline for alelrgies.  She felt itchy, rash started. She called her mom to bring her here. She had epi pen at home but didn't take it because it had been recalled. She took 90 of benadryl prior to arrival.     Past Medical History:  Diagnosis Date  . Headache(784.0)    migraines  . Hemorrhoids 12/2012  . Hypothyroidism   . Wears partial dentures    upper and lower    Patient Active Problem List   Diagnosis Date Noted  . Idiopathic urticaria 09/29/2015    Past Surgical History:  Procedure Laterality Date  . ADENOIDECTOMY  02/17/2005  . CESAREAN SECTION  07/08/2003  . EVALUATION UNDER ANESTHESIA WITH HEMORRHOIDECTOMY N/A 01/14/2013   Procedure: EXAM UNDER ANESTHESIA WITH HEMORRHOIDECTOMY POSSIBLE BANDING;  Surgeon: Madilyn Hook, DO;  Location: [facility];  Service: General;  Laterality: N/A;  . LAPAROSCOPIC TOTAL HYSTERECTOMY  10/29/2006  . MYOMECTOMY  12/18/2001   x 4  . NASAL TURBINATE REDUCTION Bilateral 02/07/2005   inferior  . TONSILLECTOMY AND ADENOIDECTOMY  age 89  . TUBAL LIGATION  07/08/2003    OB History    No data available       Home Medications    Prior to Admission medications   Medication Sig Start Date End Date Taking? Authorizing Provider  docusate sodium (COLACE) 100 MG capsule Take 1 capsule (100 mg total) by mouth 2 (two) times daily. Patient not taking: Reported on 09/28/2015 01/14/13   Madilyn Hook, DO  levothyroxine (SYNTHROID, LEVOTHROID) 150 MCG tablet Take 161 mcg by mouth  daily before breakfast.     Historical Provider, MD  levothyroxine (SYNTHROID, LEVOTHROID) 175 MCG tablet Take 175 mcg by mouth daily before breakfast. Reported on 02/07/2016    Historical Provider, MD  meloxicam (MOBIC) 15 MG tablet Take 1 tablet (15 mg total) by mouth daily. 02/16/16   Joretta Bachelor, PA  Multiple Vitamin (MULTIVITAMIN) tablet Take 1 tablet by mouth daily.    Historical Provider, MD  Naproxen Sodium (ALEVE PO) Take by mouth. Reported on 02/07/2016    Historical Provider, MD    Family History Family History  Problem Relation Age of Onset  . Diabetes Mother   . Cancer Father     Social History Social History  Substance Use Topics  . Smoking status: Never Smoker  . Smokeless tobacco: Never Used  . Alcohol use 0.0 oz/week     Comment: occasionally     Allergies   Bee venom   Review of Systems Review of Systems  Constitutional: Negative for chills and fever.  HENT: Negative for ear pain and sore throat.   Respiratory: Positive for chest tightness and shortness of breath. Negative for wheezing and stridor.   Cardiovascular: Negative for chest pain and palpitations.  Gastrointestinal: Negative for abdominal pain and vomiting.  Skin: Positive for rash. Negative for color change.  Neurological: Negative for syncope.  All other systems reviewed and are negative.  Physical Exam Updated Vital Signs BP 125/85 (BP Location: Left Arm)   Pulse 93   Temp 97.9 F (36.6 C) (Oral)   Resp 22   SpO2 97%   Physical Exam  Constitutional: She appears well-developed and well-nourished. No distress.  HENT:  Significant swelling to both eyelids. Swelling to face. Swelling to back of throat mild. Tongue normal.  Neck: Neck supple.  Cardiovascular: Normal rate and regular rhythm.   No murmur heard. Pulmonary/Chest: Effort normal and breath sounds normal. No respiratory distress.  Abdominal: Soft. There is no tenderness.  Musculoskeletal: She exhibits no edema.    Neurological: She is alert.  Skin: Skin is warm and dry.  Diffuse uticaria  Psychiatric: She has a normal mood and affect.  Nursing note and vitals reviewed.    ED Treatments / Results  Labs (all labs ordered are listed, but only abnormal results are displayed) Labs Reviewed - No data to display  EKG  EKG Interpretation None       Radiology No results found.  Procedures Procedures (including critical care time)  Medications Ordered in ED Medications  famotidine (PEPCID) IVPB 20 mg premix (not administered)  diphenhydrAMINE (BENADRYL) injection 25 mg (not administered)  EPINEPHrine (EPI-PEN) injection 0.3 mg (0.3 mg Intramuscular Given 05/14/16 1115)  methylPREDNISolone sodium succinate (SOLU-MEDROL) 125 mg/2 mL injection 125 mg (125 mg Intravenous Given 05/14/16 1117)     Initial Impression / Assessment and Plan / ED Course  I have reviewed the triage vital signs and the nursing notes.  Pertinent labs & imaging results that were available during my care of the patient were reviewed by me and considered in my medical decision making (see chart for details).  Clinical Course  Comment By Time  Much improved, able to open eyes, breath through nose. Melanie Roberts Julio Alm, MD 07/23 1518    Patient is a 45 year old female presenting with allergic reaction. Patient took ciprofloxacin this morning. She reportedly otherwise has not ingested anything or taken anything new. Patient immediately felt swelling and itchiness. Brought here by her mother. On arrival patient had significant swelling as well as feeling of swelling in her throat and nose. Took 50 benadryl pta.   Patient is provider saw patient I IM epi ordered. As well as adjunct therapies.  After 5 minutes. Patient felt mildly improved but still with a swollen throat so second dose of IM epi ordered.  Epi drip ordered.  Final Clinical Impressions(s) / ED Diagnoses   Final diagnoses:  None  Patient is a  61 year oldfe female presenting with anaphylaxis.She has stable vitals and oxygen reading on room air currently. Patient is significant throat face  and eyelid swelling. IM  epi ordered 2 without sig improvement. Epi drip ordered. Initially trouble swalowing, SOB, anxious.  Got better on Epi drip.  Crit care called.     New Prescriptions New Prescriptions   No medications on file    CRITICAL CARE Performed by: Gardiner Sleeper Total critical care time: 60 minutes Critical care time was exclusive of separately billable procedures and treating other patients. Critical care was necessary to treat or prevent imminent or life-threatening deterioration. Critical care was time spent personally by me on the following activities: development of treatment plan with patient and/or surrogate as well as nursing, discussions with consultants, evaluation of patient's response to treatment, examination of patient, obtaining history from patient or surrogate, ordering and performing treatments and interventions, ordering and review of laboratory studies, ordering and review of radiographic studies, pulse oximetry  and re-evaluation of patient's condition.    Joslyne Marshburn Julio Alm, MD 05/14/16 1525

## 2016-05-14 NOTE — ED Triage Notes (Signed)
Patient states she developed hives and shortness of breath after accidentally taking a cipro this am. Hives and redness to body, has taken benadryl 50mg  po pta.

## 2016-05-15 DIAGNOSIS — T7840XA Allergy, unspecified, initial encounter: Secondary | ICD-10-CM

## 2016-05-15 DIAGNOSIS — T782XXA Anaphylactic shock, unspecified, initial encounter: Secondary | ICD-10-CM | POA: Diagnosis not present

## 2016-05-15 LAB — BASIC METABOLIC PANEL
ANION GAP: 7 (ref 5–15)
BUN: 10 mg/dL (ref 6–20)
CO2: 23 mmol/L (ref 22–32)
Calcium: 9.7 mg/dL (ref 8.9–10.3)
Chloride: 107 mmol/L (ref 101–111)
Creatinine, Ser: 0.68 mg/dL (ref 0.44–1.00)
GFR calc Af Amer: >60 mL/min (ref >60)
GLUCOSE: 116 mg/dL — AB (ref 65–99)
POTASSIUM: 4.1 mmol/L (ref 3.5–5.1)
Sodium: 137 mmol/L (ref 135–145)

## 2016-05-15 LAB — CBC
HCT: 35.1 % — ABNORMAL LOW (ref 36.0–46.0)
HEMOGLOBIN: 11.3 g/dL — AB (ref 12.0–15.0)
MCH: 28.5 pg (ref 26.0–34.0)
MCHC: 32.2 g/dL (ref 30.0–36.0)
MCV: 88.4 fL (ref 78.0–100.0)
PLATELETS: 310 10*3/uL (ref 150–400)
RBC: 3.97 MIL/uL (ref 3.87–5.11)
RDW: 14.2 % (ref 11.5–15.5)
WBC: 12.2 10*3/uL — AB (ref 4.0–10.5)

## 2016-05-15 LAB — GLUCOSE, CAPILLARY
GLUCOSE-CAPILLARY: 130 mg/dL — AB (ref 65–99)
GLUCOSE-CAPILLARY: 190 mg/dL — AB (ref 65–99)

## 2016-05-15 MED ORDER — FAMOTIDINE 20 MG PO TABS: 20.0000 mg | ORAL_TABLET | Freq: Two times a day (BID) | ORAL | 0 refills | Status: DC

## 2016-05-15 MED ORDER — PREDNISONE 10 MG PO TABS: ORAL_TABLET | ORAL | 0 refills | Status: DC

## 2016-05-15 MED ORDER — EPINEPHRINE 0.3 MG/0.3ML IJ SOAJ: 0.3000 mg | INTRAMUSCULAR | 1 refills | Status: AC | PRN

## 2016-05-15 NOTE — Discharge Summary (Signed)
Physician Discharge Summary  Patient ID: Melanie Roberts MRN: [id number] DOB/AGE: [birth year] 45 y.o.  Admit date: 05/14/2016 Discharge date: 05/15/2016    Discharge Diagnoses:  Active Problems:   Anaphylaxis                                                       D/c plan by Discharge Diagnosis  Anaphylaxis -Resolved. Symptoms started after taking ciprofloxacin and this is the most likely culprit. She had an allergic reaction with facial swelling after taking ciprofloxacin one year ago  PLAN -  D/c home  pred taper  Benadryl PRN  pepcid 86m q12 x 2 days  List allergy to quinolones  Epi pen  PCP f/u    Brief Summary: AShakila Roberts a 45year old woman with hypothyroidism and "allergies" for which she takes cetirizine. She had an allergic reaction about a year ago to ciprofloxacin (prescribed for tendonitis) with facial swelling and she had some leftover tablets in the bottle. She underwent allergy evaluation which was otherwise negative.  Her son had allergies yesterday and took the cetirizine bottle. She woke up this morning and mistakenly took ciprofloxacin around 8:30 AM. Almost immediately she felt itchy and had hives all over her body. She had EpiPen sent home but did not take it, she took 2 Benadryl and her mom transported her to the emergency room She was noted to have diffuse urticaria and decreased air movement she also had significant facial swelling and swelling of her eyelids and felt like her throat was closing up. She received 2 doses of IM epinephrine and then was placed on epinephrine drip. After 2-3 hours she felt improved and her voice started coming back, the urticaria went away. We are asked to admit her since she is on epinephrine drip.    Consults: None  Lines/tubes: none  Microbiology/Sepsis markers:   Significant Diagnostic Studies:      Vitals:   05/15/16 0400 05/15/16 0415 05/15/16 0743 05/15/16 0800  BP: 108/72   (!) 123/93   Pulse: 75   68  Resp: 18   14  Temp:  98.4 F (36.9 C) 97.9 F (36.6 C)   TempSrc:  Oral Oral   SpO2: 100%   100%  Weight:      Height:         Discharge Labs  BMET  Recent Labs Lab 05/14/16 1130 05/15/16 0311  NA 139 137  K 3.0* 4.1  CL 106 107  CO2 28 23  GLUCOSE 150* 116*  BUN 13 10  CREATININE 0.84 0.68  CALCIUM 9.7 9.7  MG 1.9  --   PHOS 2.7  --      CBC   Recent Labs Lab 05/14/16 1130 05/15/16 0311  HGB 12.6 11.3*  HCT 39.7 35.1*  WBC 6.1 12.2*  PLT 319 310   Anti-Coagulation No results for input(s): INR in the last 168 hours.      Follow-up Information    OSEI-BONSU,GEORGE, MD. Schedule an appointment as soon as possible for a visit in 1 week(s).   Specialty:  Internal Medicine Contact information: [address] [phone]             Medication List    STOP taking these medications   ciprofloxacin 750 MG tablet Commonly known as:  CIPRO   GARCINIA CAMBOGIA-CHROMIUM PO     TAKE these medications   cetirizine 10 MG tablet Commonly known as:  ZYRTEC Take 10 mg by mouth daily.   COLON CLEANSE PO Take 2 tablets by mouth daily.   diphenhydrAMINE 25 MG tablet Commonly known as:  BENADRYL Take 50 mg by mouth every 6 (six) hours as needed for allergies.   EPINEPHrine 0.3 mg/0.3 mL Soaj injection Commonly known as:  EPIPEN 2-PAK Inject 0.3 mLs (0.3 mg total) into the muscle as needed (allergic reaction).   famotidine 20 MG tablet Commonly known as:  PEPCID Take 1 tablet (20 mg total) by mouth 2 (two) times daily.   levothyroxine 150 MCG tablet Commonly known as:  SYNTHROID, LEVOTHROID Take 150-225 mcg by mouth daily before breakfast. 151mg daily except on Thursday and Sunday take 2239m   multivitamin tablet Take 1 tablet by mouth daily.   predniSONE 10 MG tablet Commonly known as:  DELTASONE Take 4 tabs PO daily x 3 days, then STOP         Disposition: 01-Home or  Self Care  Discharged Condition: Melanie Weightmanas met maximum benefit of inpatient care and is medically stable and cleared for discharge.  Patient is pending follow up as above.      Time spent on disposition:  Greater than 35 minutes.   Signed: Nickolas MadridNP 05/15/2016  10:28 AM Pager: [phone]

## 2016-05-15 NOTE — Progress Notes (Signed)
Name: Melanie Roberts MRN: [id number] DOB: 07/17/1971    ADMISSION DATE:  05/14/2016 CONSULTATION DATE:  05/15/2016  REFERRING MD :  Thomasene Lot, EDP  CHIEF COMPLAINT:  Throat closing  BRIEF PATIENT DESCRIPTION: 45 year old woman presented to ED with anaphylaxis presumably due to ciprofloxacin   HISTORY OF PRESENT ILLNESS:  45 year old woman with hypothyroidism and "allergies" for which she takes cetirizine. She had an allergic reaction about a year ago to ciprofloxacin (prescribed for tendonitis) with facial swelling and she had some leftover tablets in the bottle. She underwent allergy evaluation which was otherwise negative.  Her son had allergies yesterday and took the cetirizine bottle. She woke up this morning and mistakenly took ciprofloxacin around 8:30 AM. Almost immediately she felt itchy and had hives all over her body. She had EpiPen sent home but did not take it, she took 2 Benadryl and her mom transported her to the emergency room She was noted to have diffuse urticaria and decreased air movement she also had significant facial swelling and swelling of her eyelids and felt like her throat was closing up. She received 2 doses of IM epinephrine and then was placed on epinephrine drip. After 2-3 hours she felt improved and her voice started coming back, the urticaria went away. We are asked to admit her since she is on epinephrine drip.     SUBJECTIVE:  Much improved.  Off epi gtt.  Ate breakfast.  No c/o.  Ambulating without difficulty to bathroom.   VITAL SIGNS: Temp:  [97.9 F (36.6 C)-98.7 F (37.1 C)] 97.9 F (36.6 C) (07/24 0743) Pulse Rate:  [68-119] 68 (07/24 0800) Resp:  [5-29] 14 (07/24 0800) BP: (98-133)/(58-99) 123/93 (07/24 0800) SpO2:  [97 %-100 %] 100 % (07/24 0800) Weight:  [97.1 kg (214 lb 1.1 oz)] 97.1 kg (214 lb 1.1 oz) (07/23 1700)  PHYSICAL EXAMINATION: General:  Pleasant female, NAD  Neuro:  Alert, interactive and non-focal HEENT:  Swelling of  eyelids improved, no tongue or lip swelling, no JVD Cardiovascular:  S1-S2 normal Lungs:  resps even non labored on RA, Clear to auscultation no rhonchi Abdomen:  Soft nontender Musculoskeletal:  No deformity Skin:  No rash, she had urticaria with large hives especially over her thigh which have now resolved   Recent Labs Lab 05/14/16 1130 05/15/16 0311  NA 139 137  K 3.0* 4.1  CL 106 107  CO2 28 23  BUN 13 10  CREATININE 0.84 0.68  GLUCOSE 150* 116*    Recent Labs Lab 05/14/16 1130 05/15/16 0311  HGB 12.6 11.3*  HCT 39.7 35.1*  WBC 6.1 12.2*  PLT 319 310   No results found.  ASSESSMENT / PLAN:  Anaphylaxis -Resolved. Symptoms started after taking ciprofloxacin and this is the most likely culprit. She had an allergic reaction with facial swelling after taking ciprofloxacin one year ago  PLAN -  D/c home  pred taper  Benadryl PRN  pepcid 20mg  q12 x 2 days  List allergy to quinolones  Epi pen  PCP f/u     Nickolas Madrid, NP 05/15/2016  10:28 AM Pager: [phone] or [phone]  STAFF NOTE: I, Merrie Roof, MD FACP have personally reviewed patient's available data, including medical history, events of note, physical examination and test results as part of my evaluation. I have discussed with resident/NP and other care providers such as pharmacist, RN and RRT. In addition, I personally evaluated patient and elicited key findings of: no wheezing, no stridor, good air entry, eye and  lip not swollen, completely resolved, ambulate and check sats on RA, pred, pepcid tapper, to home likely, this is an cipro allergy and NO QUINOLONES with life threatening swelling, have recorded and educated pt and mom Melanie Roberts. Titus Mould, MD, [place] Pgr: [facility] 05/15/2016 11:46 AM

## 2016-07-27 DIAGNOSIS — E038 Other specified hypothyroidism: Secondary | ICD-10-CM | POA: Diagnosis not present

## 2016-07-28 DIAGNOSIS — E038 Other specified hypothyroidism: Secondary | ICD-10-CM | POA: Diagnosis not present

## 2016-07-28 DIAGNOSIS — E7800 Pure hypercholesterolemia, unspecified: Secondary | ICD-10-CM | POA: Diagnosis not present

## 2016-12-21 ENCOUNTER — Ambulatory Visit (INDEPENDENT_AMBULATORY_CARE_PROVIDER_SITE_OTHER): Payer: Federal, State, Local not specified - PPO | Admitting: Emergency Medicine

## 2016-12-21 VITALS — BP 110/80 | HR 88 | Temp 98.8°F | Resp 16 | Ht 65.5 in | Wt 214.0 lb

## 2016-12-21 DIAGNOSIS — J069 Acute upper respiratory infection, unspecified: Secondary | ICD-10-CM

## 2016-12-21 DIAGNOSIS — M79671 Pain in right foot: Secondary | ICD-10-CM

## 2016-12-21 DIAGNOSIS — J209 Acute bronchitis, unspecified: Secondary | ICD-10-CM | POA: Insufficient documentation

## 2016-12-21 DIAGNOSIS — R05 Cough: Secondary | ICD-10-CM | POA: Diagnosis not present

## 2016-12-21 DIAGNOSIS — R059 Cough, unspecified: Secondary | ICD-10-CM

## 2016-12-21 MED ORDER — PREDNISONE 20 MG PO TABS: 40.0000 mg | ORAL_TABLET | Freq: Every day | ORAL | 0 refills | Status: AC

## 2016-12-21 MED ORDER — AZITHROMYCIN 250 MG PO TABS: ORAL_TABLET | ORAL | 0 refills | Status: DC

## 2016-12-21 MED ORDER — HYDROCODONE-HOMATROPINE 5-1.5 MG/5ML PO SYRP: 5.0000 mL | ORAL_SOLUTION | Freq: Three times a day (TID) | ORAL | 0 refills | Status: DC | PRN

## 2016-12-21 NOTE — Patient Instructions (Addendum)
     IF you received an x-ray today, you will receive an invoice from [facility]. Please contact [facility] at [phone] with questions or concerns regarding your invoice.   IF you received labwork today, you will receive an invoice from [facility]. Please contact [facility] at [phone] with questions or concerns regarding your invoice.   Our billing staff will not be able to assist you with questions regarding bills from these companies.  You will be contacted with the lab results as soon as they are available. The fastest way to get your results is to activate your My Chart account. Instructions are located on the last page of this paperwork. If you have not heard from us regarding the results in 2 weeks, please contact this office.      Acute Bronchitis, Adult Acute bronchitis is when air tubes (bronchi) in the lungs suddenly get swollen. The condition can make it hard to breathe. It can also cause these symptoms:  A cough.  Coughing up clear, yellow, or green mucus.  Wheezing.  Chest congestion.  Shortness of breath.  A fever.  Body aches.  Chills.  A sore throat.  Follow these instructions at home: Medicines  Take over-the-counter and prescription medicines only as told by your doctor.  If you were prescribed an antibiotic medicine, take it as told by your doctor. Do not stop taking the antibiotic even if you start to feel better. General instructions  Rest.  Drink enough fluids to keep your pee (urine) clear or pale yellow.  Avoid smoking and secondhand smoke. If you smoke and you need help quitting, ask your doctor. Quitting will help your lungs heal faster.  Use an inhaler, cool mist vaporizer, or humidifier as told by your doctor.  Keep all follow-up visits as told by your doctor. This is important. How is this prevented? To lower your risk of getting this condition again:  Wash your hands often with soap and water. If you cannot  use soap and water, use hand sanitizer.  Avoid contact with people who have cold symptoms.  Try not to touch your hands to your mouth, nose, or eyes.  Make sure to get the flu shot every year.  Contact a doctor if:  Your symptoms do not get better in 2 weeks. Get help right away if:  You cough up blood.  You have chest pain.  You have very bad shortness of breath.  You become dehydrated.  You faint (pass out) or keep feeling like you are going to pass out.  You keep throwing up (vomiting).  You have a very bad headache.  Your fever or chills gets worse. This information is not intended to replace advice given to you by your health care provider. Make sure you discuss any questions you have with your health care provider. Document Released: 03/27/2008 Document Revised: 05/17/2016 Document Reviewed: 03/29/2016 Elsevier Interactive Patient Education  2017 Elsevier Inc.  

## 2016-12-21 NOTE — Progress Notes (Signed)
Melanie Roberts 46 y.o.   Chief Complaint  Patient presents with  . Cough    NONPRODUCTIVE x 3 weeks  . Referral    Podiatrist for right heel pain x 2 weeks    HISTORY OF PRESENT ILLNESS: This is a 46 y.o. female complaining of   Cough  This is a new problem. The current episode started 1 to 4 weeks ago. The problem has been gradually worsening. The problem occurs every few minutes. The cough is productive of sputum. Associated symptoms include a fever, headaches, nasal congestion, rhinorrhea and wheezing. Pertinent negatives include no chest pain, chills, ear pain, eye redness, hemoptysis, myalgias, rash, sore throat or shortness of breath. She has tried OTC cough suppressant for the symptoms. The treatment provided no relief. There is no history of asthma, bronchiectasis, COPD, emphysema or pneumonia.     Prior to Admission medications   Medication Sig Start Date End Date Taking? Authorizing Provider  cetirizine (ZYRTEC) 10 MG tablet Take 10 mg by mouth daily.   Yes Historical Provider, MD  diphenhydrAMINE (BENADRYL) 25 MG tablet Take 50 mg by mouth every 6 (six) hours as needed for allergies.   Yes Historical Provider, MD  EPINEPHrine (EPIPEN 2-PAK) 0.3 mg/0.3 mL IJ SOAJ injection Inject 0.3 mLs (0.3 mg total) into the muscle as needed (allergic reaction). 05/15/16  Yes Marijean Heath, NP  levothyroxine (SYNTHROID, LEVOTHROID) 150 MCG tablet Take 150-225 mcg by mouth daily before breakfast. 165mcg daily except on Thursday and Sunday take 263mcg   Yes Historical Provider, MD  Misc Natural Products (COLON CLEANSE PO) Take 2 tablets by mouth daily.   Yes Historical Provider, MD  Multiple Vitamin (MULTIVITAMIN) tablet Take 1 tablet by mouth daily.   Yes Historical Provider, MD  famotidine (PEPCID) 20 MG tablet Take 1 tablet (20 mg total) by mouth 2 (two) times daily. 05/15/16 05/17/16  Marijean Heath, NP  predniSONE (DELTASONE) 10 MG tablet Take 4 tabs PO daily x 3 days,  then STOP Patient not taking: Reported on 12/21/2016 05/15/16   Marijean Heath, NP    Allergies  Allergen Reactions  . Ciprofloxacin Anaphylaxis  . Quinolones Anaphylaxis  . Bee Venom Swelling    Patient Active Problem List   Diagnosis Date Noted  . Allergic reaction   . Anaphylaxis 05/14/2016  . Idiopathic urticaria 09/29/2015    Past Medical History:  Diagnosis Date  . Headache(784.0)    migraines  . Hemorrhoids 12/2012  . Hypothyroidism   . Wears partial dentures    upper and lower    Past Surgical History:  Procedure Laterality Date  . ADENOIDECTOMY  02/17/2005  . CESAREAN SECTION  07/08/2003  . EVALUATION UNDER ANESTHESIA WITH HEMORRHOIDECTOMY N/A 01/14/2013   Procedure: EXAM UNDER ANESTHESIA WITH HEMORRHOIDECTOMY POSSIBLE BANDING;  Surgeon: Madilyn Hook, DO;  Location: [facility];  Service: General;  Laterality: N/A;  . LAPAROSCOPIC TOTAL HYSTERECTOMY  10/29/2006  . MYOMECTOMY  12/18/2001   x 4  . NASAL TURBINATE REDUCTION Bilateral 02/07/2005   inferior  . TONSILLECTOMY AND ADENOIDECTOMY  age 78  . TUBAL LIGATION  07/08/2003    Social History   Social History  . Marital status: Single    Spouse name: N/A  . Number of children: N/A  . Years of education: N/A   Occupational History  . Not on file.   Social History Main Topics  . Smoking status: Never Smoker  . Smokeless tobacco: Never Used  . Alcohol use 0.0 oz/week  Comment: occasionally  . Drug use: No  . Sexual activity: Not on file   Other Topics Concern  . Not on file   Social History Narrative  . No narrative on file    Family History  Problem Relation Age of Onset  . Diabetes Mother   . Cancer Father      Review of Systems  Constitutional: Positive for fever. Negative for chills.  HENT: Positive for congestion and rhinorrhea. Negative for ear pain, nosebleeds and sore throat.   Eyes: Negative for discharge and redness.  Respiratory: Positive for cough and wheezing.  Negative for hemoptysis and shortness of breath.   Cardiovascular: Negative for chest pain and palpitations.  Gastrointestinal: Negative for abdominal pain, diarrhea, nausea and vomiting.  Genitourinary: Negative for dysuria and hematuria.  Musculoskeletal: Negative for myalgias.  Skin: Negative for rash.  Neurological: Positive for weakness and headaches. Negative for dizziness and focal weakness.  All other systems reviewed and are negative.  Vitals:   12/21/16 0816  BP: 110/80  Pulse: 88  Resp: 16  Temp: 98.8 F (37.1 C)    Physical Exam  Constitutional: She is oriented to person, place, and time. She appears well-developed and well-nourished.  HENT:  Head: Normocephalic and atraumatic.  Nose: Nose normal.  Mouth/Throat: Oropharynx is clear and moist. No oropharyngeal exudate.  Eyes: Conjunctivae and EOM are normal. Pupils are equal, round, and reactive to light.  Neck: Normal range of motion. Neck supple. No JVD present. No thyromegaly present.  Cardiovascular: Normal rate, regular rhythm and normal heart sounds.   Pulmonary/Chest: Effort normal and breath sounds normal.  Abdominal: Soft. Bowel sounds are normal. She exhibits no distension. There is no tenderness.  Musculoskeletal: Normal range of motion.  Lymphadenopathy:    She has no cervical adenopathy.  Neurological: She is alert and oriented to person, place, and time. No sensory deficit. She exhibits normal muscle tone.  Skin: Skin is warm and dry. Capillary refill takes less than 2 seconds.  Psychiatric: She has a normal mood and affect. Her behavior is normal.  Vitals reviewed.    ASSESSMENT & PLAN: Melanie Roberts was seen today for cough and referral.  Diagnoses and all orders for this visit:  Acute bronchitis, unspecified organism  Acute upper respiratory infection  Cough  Intractable right heel pain -     Ambulatory referral to Podiatry  Other orders -     azithromycin (ZITHROMAX) 250 MG tablet; Sig as  indicated -     HYDROcodone-homatropine (HYCODAN) 5-1.5 MG/5ML syrup; Take 5 mLs by mouth every 8 (eight) hours as needed for cough. -     predniSONE (DELTASONE) 20 MG tablet; Take 2 tablets (40 mg total) by mouth daily with breakfast.    Patient Instructions       IF you received an x-ray today, you will receive an invoice from [facility]. Please contact [facility] at [phone] with questions or concerns regarding your invoice.   IF you received labwork today, you will receive an invoice from [facility]. Please contact [facility] at [phone] with questions or concerns regarding your invoice.   Our billing staff will not be able to assist you with questions regarding bills from these companies.  You will be contacted with the lab results as soon as they are available. The fastest way to get your results is to activate your My Chart account. Instructions are located on the last page of this paperwork. If you have not heard from [place] regarding the results in 2  weeks, please contact this office.     Acute Bronchitis, Adult Acute bronchitis is when air tubes (bronchi) in the lungs suddenly get swollen. The condition can make it hard to breathe. It can also cause these symptoms:  A cough.  Coughing up clear, yellow, or green mucus.  Wheezing.  Chest congestion.  Shortness of breath.  A fever.  Body aches.  Chills.  A sore throat. Follow these instructions at home: Medicines   Take over-the-counter and prescription medicines only as told by your doctor.  If you were prescribed an antibiotic medicine, take it as told by your doctor. Do not stop taking the antibiotic even if you start to feel better. General instructions   Rest.  Drink enough fluids to keep your pee (urine) clear or pale yellow.  Avoid smoking and secondhand smoke. If you smoke and you need help quitting, ask your doctor. Quitting will help your lungs heal faster.  Use an inhaler,  cool mist vaporizer, or humidifier as told by your doctor.  Keep all follow-up visits as told by your doctor. This is important. How is this prevented? To lower your risk of getting this condition again:  Wash your hands often with soap and water. If you cannot use soap and water, use hand sanitizer.  Avoid contact with people who have cold symptoms.  Try not to touch your hands to your mouth, nose, or eyes.  Make sure to get the flu shot every year. Contact a doctor if:  Your symptoms do not get better in 2 weeks. Get help right away if:  You cough up blood.  You have chest pain.  You have very bad shortness of breath.  You become dehydrated.  You faint (pass out) or keep feeling like you are going to pass out.  You keep throwing up (vomiting).  You have a very bad headache.  Your fever or chills gets worse. This information is not intended to replace advice given to you by your health care provider. Make sure you discuss any questions you have with your health care provider. Document Released: 03/27/2008 Document Revised: 05/17/2016 Document Reviewed: 03/29/2016 Elsevier Interactive Patient Education  2017 Elsevier Inc.      Agustina Caroli, MD [facility]

## 2017-01-10 ENCOUNTER — Ambulatory Visit: Payer: Federal, State, Local not specified - PPO | Admitting: Podiatry

## 2017-01-25 DIAGNOSIS — E039 Hypothyroidism, unspecified: Secondary | ICD-10-CM | POA: Diagnosis not present

## 2017-01-26 DIAGNOSIS — E038 Other specified hypothyroidism: Secondary | ICD-10-CM | POA: Insufficient documentation

## 2017-01-26 DIAGNOSIS — E785 Hyperlipidemia, unspecified: Secondary | ICD-10-CM | POA: Insufficient documentation

## 2017-01-26 DIAGNOSIS — E7800 Pure hypercholesterolemia, unspecified: Secondary | ICD-10-CM | POA: Insufficient documentation

## 2017-01-26 DIAGNOSIS — E039 Hypothyroidism, unspecified: Secondary | ICD-10-CM | POA: Insufficient documentation

## 2017-06-12 ENCOUNTER — Emergency Department (HOSPITAL_COMMUNITY): Payer: Federal, State, Local not specified - PPO

## 2017-06-12 ENCOUNTER — Encounter (HOSPITAL_COMMUNITY): Payer: Self-pay

## 2017-06-12 DIAGNOSIS — E039 Hypothyroidism, unspecified: Secondary | ICD-10-CM | POA: Diagnosis not present

## 2017-06-12 DIAGNOSIS — M436 Torticollis: Secondary | ICD-10-CM | POA: Diagnosis not present

## 2017-06-12 DIAGNOSIS — J069 Acute upper respiratory infection, unspecified: Secondary | ICD-10-CM | POA: Diagnosis not present

## 2017-06-12 DIAGNOSIS — Z79899 Other long term (current) drug therapy: Secondary | ICD-10-CM | POA: Insufficient documentation

## 2017-06-12 DIAGNOSIS — R05 Cough: Secondary | ICD-10-CM | POA: Diagnosis not present

## 2017-06-12 NOTE — ED Triage Notes (Signed)
Pt endorses dry cough x 2 days with neck discomfort. Denies any cardiac sx or shob. VSS. Afebrile.

## 2017-06-13 ENCOUNTER — Emergency Department (HOSPITAL_COMMUNITY)
Admission: EM | Admit: 2017-06-13 | Discharge: 2017-06-13 | Disposition: A | Discharge status: Home / Self Care | Payer: Federal, State, Local not specified - PPO | Attending: Emergency Medicine | Admitting: Emergency Medicine

## 2017-06-13 DIAGNOSIS — M436 Torticollis: Secondary | ICD-10-CM

## 2017-06-13 DIAGNOSIS — B9789 Other viral agents as the cause of diseases classified elsewhere: Secondary | ICD-10-CM

## 2017-06-13 DIAGNOSIS — J069 Acute upper respiratory infection, unspecified: Secondary | ICD-10-CM

## 2017-06-13 MED ORDER — ALBUTEROL SULFATE HFA 108 (90 BASE) MCG/ACT IN AERS: 2.0000 | INHALATION_SPRAY | RESPIRATORY_TRACT | 2 refills | Status: DC | PRN

## 2017-06-13 MED ORDER — TRAMADOL HCL 50 MG PO TABS: 50.0000 mg | ORAL_TABLET | Freq: Four times a day (QID) | ORAL | 0 refills | Status: DC | PRN

## 2017-06-13 MED ORDER — IBUPROFEN 800 MG PO TABS: 800.0000 mg | ORAL_TABLET | Freq: Three times a day (TID) | ORAL | 0 refills | Status: DC

## 2017-06-13 MED ORDER — BENZONATATE 100 MG PO CAPS: 100.0000 mg | ORAL_CAPSULE | Freq: Three times a day (TID) | ORAL | 0 refills | Status: DC

## 2017-06-13 NOTE — ED Provider Notes (Signed)
[place] DEPT Provider Note   CSN: [id number] Arrival date & time: 06/12/17  2147     History   Chief Complaint Chief Complaint  Patient presents with  . Cough  . Neck Pain    HPI Melanie Roberts is a 46 y.o. female.  Patient presents to the ER for evaluation of cough and neck pain. Patient reports that she has been experiencing cough for approximately 5 days.Cough has progressively worsened, keeping her up at night. She has had green sputum production with cough but no shortness of breath. 2 days ago she started to have a "crick" in her neck and this has progressively worsened. Pain is on both sides of her neck and worsens with movement. She is having trouble turning her head side to side because of the pain. She denies injury. No arm numbness, tingling, weakness or radiation of pain to upper extremities.      Past Medical History:  Diagnosis Date  . Headache(784.0)    migraines  . Hemorrhoids 12/2012  . Hypothyroidism   . Wears partial dentures    upper and lower    Patient Active Problem List   Diagnosis Date Noted  . Acute bronchitis 12/21/2016  . Allergic reaction   . Anaphylaxis 05/14/2016  . Idiopathic urticaria 09/29/2015    Past Surgical History:  Procedure Laterality Date  . ADENOIDECTOMY  02/17/2005  . CESAREAN SECTION  07/08/2003  . EVALUATION UNDER ANESTHESIA WITH HEMORRHOIDECTOMY N/A 01/14/2013   Procedure: EXAM UNDER ANESTHESIA WITH HEMORRHOIDECTOMY POSSIBLE BANDING;  Surgeon: Madilyn Hook, DO;  Location: [facility];  Service: General;  Laterality: N/A;  . LAPAROSCOPIC TOTAL HYSTERECTOMY  10/29/2006  . MYOMECTOMY  12/18/2001   x 4  . NASAL TURBINATE REDUCTION Bilateral 02/07/2005   inferior  . TONSILLECTOMY AND ADENOIDECTOMY  age 55  . TUBAL LIGATION  07/08/2003    OB History    No data available       Home Medications    Prior to Admission medications   Medication Sig Start Date End Date Taking? Authorizing Provider    albuterol (PROVENTIL HFA;VENTOLIN HFA) 108 (90 Base) MCG/ACT inhaler Inhale 2 puffs into the lungs every 4 (four) hours as needed for wheezing or shortness of breath. 06/13/17   Pollina, Gwenyth Allegra, MD  azithromycin (ZITHROMAX) 250 MG tablet Sig as indicated 12/21/16   Horald Pollen, MD  benzonatate (TESSALON) 100 MG capsule Take 1 capsule (100 mg total) by mouth every 8 (eight) hours. 06/13/17   Orpah Greek, MD  cetirizine (ZYRTEC) 10 MG tablet Take 10 mg by mouth daily.    [provider]  diphenhydrAMINE (BENADRYL) 25 MG tablet Take 50 mg by mouth every 6 (six) hours as needed for allergies.    [provider]  EPINEPHrine (EPIPEN 2-PAK) 0.3 mg/0.3 mL IJ SOAJ injection Inject 0.3 mLs (0.3 mg total) into the muscle as needed (allergic reaction). 05/15/16   Whiteheart, Cristal Ford, NP  famotidine (PEPCID) 20 MG tablet Take 1 tablet (20 mg total) by mouth 2 (two) times daily. 05/15/16 05/17/16  Whiteheart, Cristal Ford, NP  HYDROcodone-homatropine (HYCODAN) 5-1.5 MG/5ML syrup Take 5 mLs by mouth every 8 (eight) hours as needed for cough. 12/21/16   Horald Pollen, MD  ibuprofen (ADVIL,MOTRIN) 800 MG tablet Take 1 tablet (800 mg total) by mouth 3 (three) times daily. 06/13/17   Orpah Greek, MD  levothyroxine (SYNTHROID, LEVOTHROID) 150 MCG tablet Take 150-225 mcg by mouth daily before breakfast. 147mcg daily except on  Thursday and Sunday take 24mcg    [provider]  Misc Natural Products (COLON CLEANSE PO) Take 2 tablets by mouth daily.    [provider]  Multiple Vitamin (MULTIVITAMIN) tablet Take 1 tablet by mouth daily.    [provider]  traMADol (ULTRAM) 50 MG tablet Take 1 tablet (50 mg total) by mouth every 6 (six) hours as needed. 06/13/17   Orpah Greek, MD    Family History Family History  Problem Relation Age of Onset  . Diabetes Mother   . Cancer Father     Social History Social History  Substance  Use Topics  . Smoking status: Never Smoker  . Smokeless tobacco: Never Used  . Alcohol use 0.0 oz/week     Comment: occasionally     Allergies   Ciprofloxacin; Quinolones; and Bee venom   Review of Systems Review of Systems  Respiratory: Positive for cough.   Musculoskeletal: Positive for neck pain.  All other systems reviewed and are negative.    Physical Exam Updated Vital Signs BP (!) 136/93 (BP Location: Right Arm)   Pulse 78   Temp 98.2 F (36.8 C) (Oral)   Resp 16   Ht 5\' 7"  (1.702 m)   Wt 98.4 kg (217 lb)   SpO2 99%   BMI 33.99 kg/m   Physical Exam  Constitutional: She is oriented to person, place, and time. She appears well-developed and well-nourished. No distress.  HENT:  Head: Normocephalic and atraumatic.  Right Ear: Hearing normal.  Left Ear: Hearing normal.  Nose: Nose normal.  Mouth/Throat: Oropharynx is clear and moist and mucous membranes are normal.  Eyes: Pupils are equal, round, and reactive to light. Conjunctivae and EOM are normal.  Neck: Neck supple. Muscular tenderness (bilateral) present. Decreased range of motion (due to paraspinal spasm) present.  Cardiovascular: Regular rhythm, S1 normal and S2 normal.  Exam reveals no gallop and no friction rub.   No murmur heard. Pulmonary/Chest: Effort normal and breath sounds normal. No respiratory distress. She exhibits no tenderness.  Abdominal: Soft. Normal appearance and bowel sounds are normal. There is no hepatosplenomegaly. There is no tenderness. There is no rebound, no guarding, no tenderness at McBurney's point and negative Murphy's sign. No hernia.  Neurological: She is alert and oriented to person, place, and time. She has normal strength. No cranial nerve deficit or sensory deficit. Coordination normal. GCS eye subscore is 4. GCS verbal subscore is 5. GCS motor subscore is 6.  Skin: Skin is warm, dry and intact. No rash noted. No cyanosis.  Psychiatric: She has a normal mood and affect. Her  speech is normal and behavior is normal. Thought content normal.  Nursing note and vitals reviewed.    ED Treatments / Results  Labs (all labs ordered are listed, but only abnormal results are displayed) Labs Reviewed - No data to display  EKG  EKG Interpretation None       Radiology Dg Chest 2 View  Result Date: 06/12/2017 CLINICAL DATA:  Cough. EXAM: CHEST  2 VIEW COMPARISON:  None. FINDINGS: The heart size and mediastinal contours are within normal limits. Both lungs are clear. No pneumothorax or pleural effusion is noted. The visualized skeletal structures are unremarkable. IMPRESSION: No active cardiopulmonary disease. Electronically Signed   By: Marijo Conception, M.D.   On: 06/12/2017 22:51    Procedures Procedures (including critical care time)  Medications Ordered in ED Medications - No data to display   Initial Impression / Assessment and  Plan / ED Course  I have reviewed the triage vital signs and the nursing notes.  Pertinent labs & imaging results that were available during my care of the patient were reviewed by me and considered in my medical decision making (see chart for details).     Patient presents to the emergency department with cough and neck pain. Cough has been ongoing for 5 days. Lung examination is unremarkable. Oxygenation is 100%, no wheezing or bronchospasm. Chest x-ray is clear, no evidence of pneumonia. Symptoms consistent with viral upper respiratory infection.  She is complaining of neck pain. This started 2 days ago and has progressively worsened. Examination today is consistent with severe spasm of the paraspinal muscles including range of motion. No concern for meningitis. No concern for herniated disc or radiculopathy. Pain does not radiate, extremity strength and sensation are normal.  Final Clinical Impressions(s) / ED Diagnoses   Final diagnoses:  Torticollis  Viral URI with cough    New Prescriptions New Prescriptions    ALBUTEROL (PROVENTIL HFA;VENTOLIN HFA) 108 (90 BASE) MCG/ACT INHALER    Inhale 2 puffs into the lungs every 4 (four) hours as needed for wheezing or shortness of breath.   BENZONATATE (TESSALON) 100 MG CAPSULE    Take 1 capsule (100 mg total) by mouth every 8 (eight) hours.   IBUPROFEN (ADVIL,MOTRIN) 800 MG TABLET    Take 1 tablet (800 mg total) by mouth 3 (three) times daily.   TRAMADOL (ULTRAM) 50 MG TABLET    Take 1 tablet (50 mg total) by mouth every 6 (six) hours as needed.     Orpah Greek, MD 06/13/17 [phone]

## 2017-08-13 DIAGNOSIS — E039 Hypothyroidism, unspecified: Secondary | ICD-10-CM | POA: Diagnosis not present

## 2017-08-13 DIAGNOSIS — Z7982 Long term (current) use of aspirin: Secondary | ICD-10-CM | POA: Diagnosis not present

## 2017-08-13 DIAGNOSIS — E7800 Pure hypercholesterolemia, unspecified: Secondary | ICD-10-CM | POA: Diagnosis not present

## 2017-10-26 ENCOUNTER — Ambulatory Visit: Payer: Federal, State, Local not specified - PPO | Admitting: Physician Assistant

## 2017-10-26 DIAGNOSIS — R05 Cough: Secondary | ICD-10-CM | POA: Diagnosis not present

## 2017-12-12 ENCOUNTER — Other Ambulatory Visit: Payer: Self-pay

## 2017-12-12 ENCOUNTER — Encounter: Payer: Self-pay | Admitting: Physician Assistant

## 2017-12-12 ENCOUNTER — Other Ambulatory Visit: Payer: Self-pay | Admitting: Physician Assistant

## 2017-12-12 ENCOUNTER — Ambulatory Visit (INDEPENDENT_AMBULATORY_CARE_PROVIDER_SITE_OTHER): Payer: Federal, State, Local not specified - PPO | Admitting: Physician Assistant

## 2017-12-12 ENCOUNTER — Ambulatory Visit (INDEPENDENT_AMBULATORY_CARE_PROVIDER_SITE_OTHER): Payer: Federal, State, Local not specified - PPO

## 2017-12-12 VITALS — BP 130/100 | HR 76 | Temp 99.0°F | Resp 16 | Ht 66.0 in | Wt 213.8 lb

## 2017-12-12 DIAGNOSIS — R05 Cough: Secondary | ICD-10-CM

## 2017-12-12 DIAGNOSIS — R058 Other specified cough: Secondary | ICD-10-CM

## 2017-12-12 DIAGNOSIS — B9789 Other viral agents as the cause of diseases classified elsewhere: Principal | ICD-10-CM

## 2017-12-12 DIAGNOSIS — J069 Acute upper respiratory infection, unspecified: Secondary | ICD-10-CM

## 2017-12-12 MED ORDER — GUAIFENESIN ER 1200 MG PO TB12: 1.0000 | ORAL_TABLET | Freq: Two times a day (BID) | ORAL | 1 refills | Status: DC | PRN

## 2017-12-12 MED ORDER — IPRATROPIUM BROMIDE 0.03 % NA SOLN: 2.0000 | Freq: Two times a day (BID) | NASAL | 0 refills | Status: DC

## 2017-12-12 MED ORDER — BENZONATATE 100 MG PO CAPS: 100.0000 mg | ORAL_CAPSULE | Freq: Three times a day (TID) | ORAL | 0 refills | Status: DC | PRN

## 2017-12-12 MED ORDER — ALBUTEROL SULFATE HFA 108 (90 BASE) MCG/ACT IN AERS: 2.0000 | INHALATION_SPRAY | RESPIRATORY_TRACT | 1 refills | Status: DC | PRN

## 2017-12-12 NOTE — Patient Instructions (Addendum)
Please hydrate well with 64 oz of water per day.   Upper Respiratory Infection, Adult Most upper respiratory infections (URIs) are caused by a virus. A URI affects the nose, throat, and upper air passages. The most common type of URI is often called "the common cold." Follow these instructions at home:  Take medicines only as told by your doctor.  Gargle warm saltwater or take cough drops to comfort your throat as told by your doctor.  Use a warm mist humidifier or inhale steam from a shower to increase air moisture. This may make it easier to breathe.  Drink enough fluid to keep your pee (urine) clear or pale yellow.  Eat soups and other clear broths.  Have a healthy diet.  Rest as needed.  Go back to work when your fever is gone or your doctor says it is okay. ? You may need to stay home longer to avoid giving your URI to others. ? You can also wear a face mask and wash your hands often to prevent spread of the virus.  Use your inhaler more if you have asthma.  Do not use any tobacco products, including cigarettes, chewing tobacco, or electronic cigarettes. If you need help quitting, ask your doctor. Contact a doctor if:  You are getting worse, not better.  Your symptoms are not helped by medicine.  You have chills.  You are getting more short of breath.  You have brown or red mucus.  You have yellow or brown discharge from your nose.  You have pain in your face, especially when you bend forward.  You have a fever.  You have puffy (swollen) neck glands.  You have pain while swallowing.  You have white areas in the back of your throat. Get help right away if:  You have very bad or constant: ? Headache. ? Ear pain. ? Pain in your forehead, behind your eyes, and over your cheekbones (sinus pain). ? Chest pain.  You have long-lasting (chronic) lung disease and any of the following: ? Wheezing. ? Long-lasting cough. ? Coughing up blood. ? A change in your  usual mucus.  You have a stiff neck.  You have changes in your: ? Vision. ? Hearing. ? Thinking. ? Mood. This information is not intended to replace advice given to you by your health care provider. Make sure you discuss any questions you have with your health care provider. Document Released: 03/27/2008 Document Revised: 06/11/2016 Document Reviewed: 01/14/2014 Elsevier Interactive Patient Education  2018 [employer].    IF you received an x-ray today, you will receive an invoice from [facility]. Please contact [facility] at [phone] with questions or concerns regarding your invoice.   IF you received labwork today, you will receive an invoice from [facility]. Please contact [facility] at [phone] with questions or concerns regarding your invoice.   Our billing staff will not be able to assist you with questions regarding bills from these companies.  You will be contacted with the lab results as soon as they are available. The fastest way to get your results is to activate your My Chart account. Instructions are located on the last page of this paperwork. If you have not heard from [place] regarding the results in 2 weeks, please contact this office.

## 2017-12-12 NOTE — Progress Notes (Signed)
PRIMARY CARE AT [facility] [address], [place] [address] [phone]  Date:  12/12/2017   Name:  Melanie Roberts   DOB:  19-Dec-1970   MRN:  [id number]  PCP:  Benito Mccreedy, MD    History of Present Illness:  Melanie Roberts is a 47 y.o. female patient who presents to PCP with  Chief Complaint  Patient presents with  . Cough    "persistent' dry, strangling cough" x 3 days     Cough that has worsened over the last 3 days.  It is dry and can come in fits. Nasal congestion minimal No sneezing.   Currently not taking allergy medications at this time. No fefver.   Patient Active Problem List   Diagnosis Date Noted  . Acute bronchitis 12/21/2016  . Allergic reaction   . Anaphylaxis 05/14/2016  . Idiopathic urticaria 09/29/2015    Past Medical History:  Diagnosis Date  . Headache(784.0)    migraines  . Hemorrhoids 12/2012  . Hypothyroidism   . Wears partial dentures    upper and lower    Past Surgical History:  Procedure Laterality Date  . ADENOIDECTOMY  02/17/2005  . CESAREAN SECTION  07/08/2003  . EVALUATION UNDER ANESTHESIA WITH HEMORRHOIDECTOMY N/A 01/14/2013   Procedure: EXAM UNDER ANESTHESIA WITH HEMORRHOIDECTOMY POSSIBLE BANDING;  Surgeon: Madilyn Hook, DO;  Location: [facility];  Service: General;  Laterality: N/A;  . LAPAROSCOPIC TOTAL HYSTERECTOMY  10/29/2006  . MYOMECTOMY  12/18/2001   x 4  . NASAL TURBINATE REDUCTION Bilateral 02/07/2005   inferior  . TONSILLECTOMY AND ADENOIDECTOMY  age 48  . TUBAL LIGATION  07/08/2003    Social History   Tobacco Use  . Smoking status: Never Smoker  . Smokeless tobacco: Never Used  Substance Use Topics  . Alcohol use: Yes    Alcohol/week: 0.0 oz    Comment: occasionally  . Drug use: No    Family History  Problem Relation Age of Onset  . Diabetes Mother   . Cancer Father     Allergies  Allergen Reactions  . Ciprofloxacin Anaphylaxis  . Quinolones Anaphylaxis  . Bee Venom  Swelling    Medication list has been reviewed and updated.  Current Outpatient Medications on File Prior to Visit  Medication Sig Dispense Refill  . albuterol (PROVENTIL HFA;VENTOLIN HFA) 108 (90 Base) MCG/ACT inhaler Inhale 2 puffs into the lungs every 4 (four) hours as needed for wheezing or shortness of breath. 1 Inhaler 2  . cetirizine (ZYRTEC) 10 MG tablet Take 10 mg by mouth daily.    Marland Kitchen EPINEPHrine (EPIPEN 2-PAK) 0.3 mg/0.3 mL IJ SOAJ injection Inject 0.3 mLs (0.3 mg total) into the muscle as needed (allergic reaction). 1 Device 1  . levothyroxine (SYNTHROID, LEVOTHROID) 150 MCG tablet Take 150-225 mcg by mouth daily before breakfast. 193mcg daily except on Thursday and Sunday take 252mcg    . famotidine (PEPCID) 20 MG tablet Take 1 tablet (20 mg total) by mouth 2 (two) times daily. 4 tablet 0  . Multiple Vitamin (MULTIVITAMIN) tablet Take 1 tablet by mouth daily.    . traMADol (ULTRAM) 50 MG tablet Take 1 tablet (50 mg total) by mouth every 6 (six) hours as needed. (Patient not taking: Reported on 12/12/2017) 15 tablet 0   No current facility-administered medications on file prior to visit.     ROS ROS otherwise unremarkable unless listed above.  Physical Examination: BP (!) 130/100   Pulse 76   Temp 99 F (37.2 C) (Oral)  Resp 16   Ht 5\' 6"  (1.676 m)   Wt 213 lb 12.8 oz (97 kg)   SpO2 99%   BMI 34.51 kg/m  Ideal Body Weight: Weight in (lb) to have BMI = 25: 154.6  Physical Exam  Constitutional: She is oriented to person, place, and time. She appears well-developed and well-nourished. No distress.  HENT:  Head: Normocephalic and atraumatic.  Right Ear: Tympanic membrane, external ear and ear canal normal.  Left Ear: Tympanic membrane, external ear and ear canal normal.  Nose: Mucosal edema and rhinorrhea present. Right sinus exhibits no maxillary sinus tenderness and no frontal sinus tenderness. Left sinus exhibits no maxillary sinus tenderness and no frontal sinus  tenderness.  Mouth/Throat: No uvula swelling. No oropharyngeal exudate, posterior oropharyngeal edema or posterior oropharyngeal erythema.  Eyes: Conjunctivae and EOM are normal. Pupils are equal, round, and reactive to light.  Cardiovascular: Normal rate and regular rhythm. Exam reveals no gallop, no distant heart sounds and no friction rub.  No murmur heard. Pulmonary/Chest: Effort normal. No respiratory distress. She has no decreased breath sounds. She has no wheezes. She has no rhonchi.  Lymphadenopathy:       Head (right side): No submandibular, no tonsillar, no preauricular and no posterior auricular adenopathy present.       Head (left side): No submandibular, no tonsillar, no preauricular and no posterior auricular adenopathy present.  Neurological: She is alert and oriented to person, place, and time.  Skin: She is not diaphoretic.  Psychiatric: She has a normal mood and affect. Her behavior is normal.    Dg Chest 2 View  Result Date: 12/12/2017 CLINICAL DATA:  Cough EXAM: CHEST  2 VIEW COMPARISON:  None. FINDINGS: Lungs are clear. Heart size and pulmonary vascularity are normal. No adenopathy. No bone lesions. IMPRESSION: No edema or consolidation. Electronically Signed   By: Lowella Grip III M.D.   On: 12/12/2017 11:48     Assessment and Plan: Melanie Roberts is a 47 y.o. female who is here today for cc of  Chief Complaint  Patient presents with  . Cough    "persistent' dry, strangling cough" x 3 days   Viral URI with cough - Plan: albuterol (PROVENTIL HFA;VENTOLIN HFA) 108 (90 Base) MCG/ACT inhaler, DISCONTINUED: ipratropium (ATROVENT) 0.03 % nasal spray  Productive cough - Plan: DG Chest 2 View  Ivar Drape, PA-C [facility] 3/1/20199:59 AM

## 2017-12-13 ENCOUNTER — Telehealth: Payer: Self-pay

## 2017-12-13 DIAGNOSIS — J069 Acute upper respiratory infection, unspecified: Secondary | ICD-10-CM

## 2017-12-13 DIAGNOSIS — B9789 Other viral agents as the cause of diseases classified elsewhere: Principal | ICD-10-CM

## 2017-12-13 NOTE — Telephone Encounter (Signed)
Copied from [place] [id number]. Topic: Quick Communication - See Telephone Encounter >> Dec 12, 2017  1:16 PM Hewitt Shorts wrote: Pt was seen today by stephanie English and was told that  liquid cough syrup would be called [facility] to [facility] on [address] and it has not been called in yet  Best number   12/12/17. >> Dec 12, 2017  1:29 PM Hewitt Shorts wrote: Best number to call if needed is [phone]

## 2017-12-13 NOTE — Telephone Encounter (Signed)
Provider, please advise.  

## 2017-12-14 MED ORDER — HYDROCOD POLST-CPM POLST ER 10-8 MG/5ML PO SUER: 5.0000 mL | Freq: Every evening | ORAL | 0 refills | Status: DC | PRN

## 2017-12-14 NOTE — Telephone Encounter (Signed)
sent 

## 2017-12-19 ENCOUNTER — Ambulatory Visit (INDEPENDENT_AMBULATORY_CARE_PROVIDER_SITE_OTHER): Payer: Federal, State, Local not specified - PPO | Admitting: Physician Assistant

## 2017-12-19 ENCOUNTER — Encounter: Payer: Self-pay | Admitting: Physician Assistant

## 2017-12-19 VITALS — BP 127/85 | HR 80 | Temp 98.1°F | Resp 16 | Ht 66.0 in | Wt 218.0 lb

## 2017-12-19 DIAGNOSIS — R05 Cough: Secondary | ICD-10-CM | POA: Diagnosis not present

## 2017-12-19 DIAGNOSIS — R059 Cough, unspecified: Secondary | ICD-10-CM

## 2017-12-19 MED ORDER — CETIRIZINE HCL 10 MG PO TABS: 10.0000 mg | ORAL_TABLET | Freq: Every day | ORAL | 11 refills | Status: DC

## 2017-12-19 MED ORDER — OMEPRAZOLE 20 MG PO CPDR: 20.0000 mg | DELAYED_RELEASE_CAPSULE | Freq: Every day | ORAL | 2 refills | Status: DC

## 2017-12-19 MED ORDER — BENZONATATE 100 MG PO CAPS
100.0000 mg | ORAL_CAPSULE | Freq: Three times a day (TID) | ORAL | 0 refills | Status: DC | PRN
Start: 2017-12-19 — End: 2018-09-25

## 2017-12-19 NOTE — Patient Instructions (Addendum)
You will let me know if the cough has not improved with the reflux medication and the allergy medication (prilosec, zyrtec).  I want you to follow this reflux diet below.    Food Choices for Gastroesophageal Reflux Disease, Adult When you have gastroesophageal reflux disease (GERD), the foods you eat and your eating habits are very important. Choosing the right foods can help ease your discomfort. What guidelines do I need to follow?  Choose fruits, vegetables, whole grains, and low-fat dairy products.  Choose low-fat meat, fish, and poultry.  Limit fats such as oils, salad dressings, butter, nuts, and avocado.  Keep a food diary. This helps you identify foods that cause symptoms.  Avoid foods that cause symptoms. These may be different for everyone.  Eat small meals often instead of 3 large meals a day.  Eat your meals slowly, in a place where you are relaxed.  Limit fried foods.  Cook foods using methods other than frying.  Avoid drinking alcohol.  Avoid drinking large amounts of liquids with your meals.  Avoid bending over or lying down until 2-3 hours after eating. What foods are not recommended? These are some foods and drinks that may make your symptoms worse: Vegetables Tomatoes. Tomato juice. Tomato and spaghetti sauce. Chili peppers. Onion and garlic. Horseradish. Fruits Oranges, grapefruit, and lemon (fruit and juice). Meats High-fat meats, fish, and poultry. This includes hot dogs, ribs, ham, sausage, salami, and bacon. Dairy Whole milk and chocolate milk. Sour cream. Cream. Butter. Ice cream. Cream cheese. Drinks Coffee and tea. Bubbly (carbonated) drinks or energy drinks. Condiments Hot sauce. Barbecue sauce. Sweets/Desserts Chocolate and cocoa. Donuts. Peppermint and spearmint. Fats and Oils High-fat foods. This includes [place] fries and potato chips. Other Vinegar. Strong spices. This includes black pepper, white pepper, red pepper, cayenne, curry  powder, cloves, ginger, and chili powder. The items listed above may not be a complete list of foods and drinks to avoid. Contact your dietitian for more information. This information is not intended to replace advice given to you by your health care provider. Make sure you discuss any questions you have with your health care provider. Document Released: 04/09/2012 Document Revised: 03/16/2016 Document Reviewed: 08/13/2013 Elsevier Interactive Patient Education  2017 [employer].     IF you received an x-ray today, you will receive an invoice from [facility]. Please contact [facility] at [phone] with questions or concerns regarding your invoice.   IF you received labwork today, you will receive an invoice from [place]. Please contact [facility] at [phone] with questions or concerns regarding your invoice.   Our billing staff will not be able to assist you with questions regarding bills from these companies.  You will be contacted with the lab results as soon as they are available. The fastest way to get your results is to activate your My Chart account. Instructions are located on the last page of this paperwork. If you have not heard from [place] regarding the results in 2 weeks, please contact this office.

## 2017-12-19 NOTE — Progress Notes (Signed)
PRIMARY CARE AT [facility] [address] [place] [address] [phone]  Date:  12/19/2017   Name:  Melanie Roberts   DOB:  1970-11-21   MRN:  [id number]  PCP:  Patient, No Pcp Per    History of Present Illness:  Melanie Roberts is a 47 y.o. female patient who presents to PCP with  Chief Complaint  Patient presents with  . Viral URI with cough    Follow up, slight improvement     She is drinking 64-84 oz of water per day. She is having some nausea.  She will have sour taste in mouth, when she coughs.   She is not eating a lot of spicy foods, or fried foods.  Alcohol is rare Cough is non-productive of phlegm.  Denies any trouble breathing.  She does not smoke.    Wt Readings from Last 3 Encounters:  12/19/17 218 lb (98.9 kg)  12/12/17 213 lb 12.8 oz (97 kg)  06/12/17 217 lb (98.4 kg)     Patient Active Problem List   Diagnosis Date Noted  . Acute bronchitis 12/21/2016  . Allergic reaction   . Anaphylaxis 05/14/2016  . Idiopathic urticaria 09/29/2015    Past Medical History:  Diagnosis Date  . Headache(784.0)    migraines  . Hemorrhoids 12/2012  . Hypothyroidism   . Wears partial dentures    upper and lower    Past Surgical History:  Procedure Laterality Date  . ADENOIDECTOMY  02/17/2005  . CESAREAN SECTION  07/08/2003  . EVALUATION UNDER ANESTHESIA WITH HEMORRHOIDECTOMY N/A 01/14/2013   Procedure: EXAM UNDER ANESTHESIA WITH HEMORRHOIDECTOMY POSSIBLE BANDING;  Surgeon: Madilyn Hook, DO;  Location: [place];  Service: General;  Laterality: N/A;  . LAPAROSCOPIC TOTAL HYSTERECTOMY  10/29/2006  . MYOMECTOMY  12/18/2001   x 4  . NASAL TURBINATE REDUCTION Bilateral 02/07/2005   inferior  . TONSILLECTOMY AND ADENOIDECTOMY  age 15  . TUBAL LIGATION  07/08/2003    Social History   Tobacco Use  . Smoking status: Never Smoker  . Smokeless tobacco: Never Used  Substance Use Topics  . Alcohol use: Yes    Alcohol/week: 0.0 oz    Comment:  occasionally  . Drug use: No    Family History  Problem Relation Age of Onset  . Diabetes Mother   . Cancer Father     Allergies  Allergen Reactions  . Ciprofloxacin Anaphylaxis  . Quinolones Anaphylaxis  . Bee Venom Swelling    Medication list has been reviewed and updated.  Current Outpatient Medications on File Prior to Visit  Medication Sig Dispense Refill  . albuterol (PROVENTIL HFA;VENTOLIN HFA) 108 (90 Base) MCG/ACT inhaler Inhale 2 puffs into the lungs every 4 (four) hours as needed for wheezing or shortness of breath. 1 Inhaler 2  . albuterol (PROVENTIL HFA;VENTOLIN HFA) 108 (90 Base) MCG/ACT inhaler Inhale 2 puffs into the lungs every 4 (four) hours as needed for wheezing or shortness of breath (cough, shortness of breath or wheezing.). 1 Inhaler 1  . benzonatate (TESSALON) 100 MG capsule Take 1-2 capsules (100-200 mg total) by mouth 3 (three) times daily as needed for cough. 40 capsule 0  . EPINEPHrine (EPIPEN 2-PAK) 0.3 mg/0.3 mL IJ SOAJ injection Inject 0.3 mLs (0.3 mg total) into the muscle as needed (allergic reaction). 1 Device 1  . levothyroxine (SYNTHROID, LEVOTHROID) 150 MCG tablet Take 150-225 mcg by mouth daily before breakfast. 149mcg daily except on Thursday and Sunday take 234mcg    . chlorpheniramine-HYDROcodone (  TUSSIONEX PENNKINETIC ER) 10-8 MG/5ML SUER Take 5 mLs by mouth at bedtime as needed. (Patient not taking: Reported on 12/19/2017) 60 mL 0  . famotidine (PEPCID) 20 MG tablet Take 1 tablet (20 mg total) by mouth 2 (two) times daily. 4 tablet 0   No current facility-administered medications on file prior to visit.     ROS ROS otherwise unremarkable unless listed above.  Physical Examination: BP 127/85   Pulse 80   Temp 98.1 F (36.7 C) (Oral)   Resp 16   Ht 5\' 6"  (1.676 m)   Wt 218 lb (98.9 kg)   SpO2 95%   BMI 35.19 kg/m  Ideal Body Weight: Weight in (lb) to have BMI = 25: 154.6  Physical Exam   Assessment and Plan: Melanie Roberts is a 47 y.o. female who is here today for cc of  Chief Complaint  Patient presents with  . Viral URI with cough    Follow up, slight improvement  possibly GERD, vs bronchitis vs allergies.  I do not think this is bacterial etiology.  Advised omeprazole and zyrtec at this time.  Advised to follow up by phone in 10 days.  If she has no improvement, will consider prednisone.  There is possible risk of reflux-like symptoms with prednisone so would like to try separately at this time.   Cough - Plan: omeprazole (PRILOSEC) 20 MG capsule, benzonatate (TESSALON) 100 MG capsule, cetirizine (ZYRTEC) 10 MG tablet  Ivar Drape, PA-C [facility] 2/27/20191:18 PM  \

## 2018-01-07 ENCOUNTER — Other Ambulatory Visit: Payer: Self-pay | Admitting: Physician Assistant

## 2018-01-07 DIAGNOSIS — J069 Acute upper respiratory infection, unspecified: Secondary | ICD-10-CM

## 2018-01-07 DIAGNOSIS — B9789 Other viral agents as the cause of diseases classified elsewhere: Principal | ICD-10-CM

## 2018-01-07 NOTE — Telephone Encounter (Signed)
Atrovent refill  Request  [facility] [phone] [place], [place] - [address]  Dr. Cleophus Molt

## 2018-01-22 ENCOUNTER — Encounter: Payer: Self-pay | Admitting: Physician Assistant

## 2018-02-04 ENCOUNTER — Other Ambulatory Visit: Payer: Self-pay | Admitting: Physician Assistant

## 2018-02-04 ENCOUNTER — Telehealth: Payer: Self-pay | Admitting: General Practice

## 2018-02-04 DIAGNOSIS — K219 Gastro-esophageal reflux disease without esophagitis: Secondary | ICD-10-CM

## 2018-02-04 DIAGNOSIS — R05 Cough: Secondary | ICD-10-CM

## 2018-02-04 DIAGNOSIS — R059 Cough, unspecified: Secondary | ICD-10-CM

## 2018-02-04 MED ORDER — CEFDINIR 300 MG PO CAPS: 600.0000 mg | ORAL_CAPSULE | Freq: Every day | ORAL | 0 refills | Status: DC

## 2018-02-04 MED ORDER — RANITIDINE HCL 150 MG PO TABS: 150.0000 mg | ORAL_TABLET | Freq: Two times a day (BID) | ORAL | 0 refills | Status: DC

## 2018-02-04 NOTE — Telephone Encounter (Signed)
Copied from [place] [id number]. Topic: Quick Communication - See Telephone Encounter >> Feb 04, 2018  9:11 AM Arletha Grippe wrote: CRM for notification. See Telephone encounter for: 02/04/18. Pt was told to call back if cough did not go away.  She was told that a different med would be prescribed. Her cough comes back at night.  [facility] [facility] [address]. Cb is [phone]

## 2018-02-04 NOTE — Telephone Encounter (Signed)
This was possible reflux.  I am adding the ranitidine.  She can take the prilosec and the ranitidine.  I will also start her on an abx for 7 days.  Please follow up for eval in 10 days if symptoms do not improve.   We should do additional work up at that time. Please make sure you follow the anti-gerd instruction I gave you at your last visit.

## 2018-02-04 NOTE — Addendum Note (Signed)
Addended by: Ivar Drape D on: 02/04/2018 03:33 PM   Modules accepted: Orders

## 2018-02-05 NOTE — Telephone Encounter (Signed)
Left detailed VM advising pt.  

## 2018-02-14 DIAGNOSIS — E039 Hypothyroidism, unspecified: Secondary | ICD-10-CM | POA: Diagnosis not present

## 2018-02-15 DIAGNOSIS — E7800 Pure hypercholesterolemia, unspecified: Secondary | ICD-10-CM | POA: Diagnosis not present

## 2018-02-15 DIAGNOSIS — E039 Hypothyroidism, unspecified: Secondary | ICD-10-CM | POA: Diagnosis not present

## 2018-08-09 DIAGNOSIS — E039 Hypothyroidism, unspecified: Secondary | ICD-10-CM | POA: Diagnosis not present

## 2018-08-16 DIAGNOSIS — E7800 Pure hypercholesterolemia, unspecified: Secondary | ICD-10-CM | POA: Diagnosis not present

## 2018-08-16 DIAGNOSIS — E039 Hypothyroidism, unspecified: Secondary | ICD-10-CM | POA: Diagnosis not present

## 2018-08-18 ENCOUNTER — Other Ambulatory Visit: Payer: Self-pay

## 2018-08-18 ENCOUNTER — Emergency Department (HOSPITAL_COMMUNITY)
Admission: EM | Admit: 2018-08-18 | Discharge: 2018-08-18 | Disposition: A | Discharge status: Home / Self Care | Payer: Federal, State, Local not specified - PPO | Attending: Emergency Medicine | Admitting: Emergency Medicine

## 2018-08-18 ENCOUNTER — Encounter (HOSPITAL_COMMUNITY): Payer: Self-pay | Admitting: Emergency Medicine

## 2018-08-18 DIAGNOSIS — M25552 Pain in left hip: Secondary | ICD-10-CM | POA: Insufficient documentation

## 2018-08-18 DIAGNOSIS — E039 Hypothyroidism, unspecified: Secondary | ICD-10-CM | POA: Insufficient documentation

## 2018-08-18 DIAGNOSIS — Z79899 Other long term (current) drug therapy: Secondary | ICD-10-CM | POA: Diagnosis not present

## 2018-08-18 DIAGNOSIS — M79605 Pain in left leg: Secondary | ICD-10-CM

## 2018-08-18 DIAGNOSIS — M5432 Sciatica, left side: Secondary | ICD-10-CM | POA: Diagnosis not present

## 2018-08-18 NOTE — ED Triage Notes (Signed)
C/o L hip and thigh pain x 3 weeks.  States she can sometimes arch her back and the leg pain will be relieved for a little while.  History of uterine fibroids that caused similar symptoms in the past- had fibroids removed in 2003.

## 2018-08-18 NOTE — ED Provider Notes (Signed)
[facility] EMERGENCY DEPARTMENT Provider Note   CSN: [id number] Arrival date & time: 08/18/18  1935     History   Chief Complaint Chief Complaint  Patient presents with  . Leg Pain    HPI Melanie Roberts is a 47 y.o. female who presents today for evaluation of left hip pain.  She reports that she has had left-sided pain that radiates to her left thigh for approximately 3 weeks.  Her pain does not go past her knee.  She denies any injury prior to the onset of symptoms.  She reports that in the past she has had uterine fibroids that were causing nerve compression that caused similar symptoms, however she had a full hysterectomy.  Denies any fevers.  No history of IV drug use.  No personal history of cancer.  No changes to bowel/bladder function.  No saddle anesthesia.  HPI  Past Medical History:  Diagnosis Date  . Headache(784.0)    migraines  . Hemorrhoids 12/2012  . Hypothyroidism   . Wears partial dentures    upper and lower    Patient Active Problem List   Diagnosis Date Noted  . Acute bronchitis 12/21/2016  . Allergic reaction   . Anaphylaxis 05/14/2016  . Idiopathic urticaria 09/29/2015    Past Surgical History:  Procedure Laterality Date  . ADENOIDECTOMY  02/17/2005  . CESAREAN SECTION  07/08/2003  . EVALUATION UNDER ANESTHESIA WITH HEMORRHOIDECTOMY N/A 01/14/2013   Procedure: EXAM UNDER ANESTHESIA WITH HEMORRHOIDECTOMY POSSIBLE BANDING;  Surgeon: Madilyn Hook, DO;  Location: [facility];  Service: General;  Laterality: N/A;  . LAPAROSCOPIC TOTAL HYSTERECTOMY  10/29/2006  . MYOMECTOMY  12/18/2001   x 4  . NASAL TURBINATE REDUCTION Bilateral 02/07/2005   inferior  . TONSILLECTOMY AND ADENOIDECTOMY  age 36  . TUBAL LIGATION  07/08/2003     OB History   None      Home Medications    Prior to Admission medications   Medication Sig Start Date End Date Taking? Authorizing Provider  albuterol (PROVENTIL HFA;VENTOLIN HFA) 108  (90 Base) MCG/ACT inhaler Inhale 2 puffs into the lungs every 4 (four) hours as needed for wheezing or shortness of breath. 06/13/17   Pollina, Gwenyth Allegra, MD  albuterol (PROVENTIL HFA;VENTOLIN HFA) 108 (90 Base) MCG/ACT inhaler Inhale 2 puffs into the lungs every 4 (four) hours as needed for wheezing or shortness of breath (cough, shortness of breath or wheezing.). 12/12/17   Ivar Drape D, PA  benzonatate (TESSALON) 100 MG capsule Take 1-2 capsules (100-200 mg total) by mouth 3 (three) times daily as needed for cough. 12/12/17   Ivar Drape D, PA  benzonatate (TESSALON) 100 MG capsule Take 1-2 capsules (100-200 mg total) by mouth 3 (three) times daily as needed for cough. 12/19/17   Ivar Drape D, PA  cefdinir (OMNICEF) 300 MG capsule Take 2 capsules (600 mg total) by mouth daily. 02/04/18   Ivar Drape D, PA  cetirizine (ZYRTEC) 10 MG tablet Take 1 tablet (10 mg total) by mouth daily. 12/19/17   Ivar Drape D, PA  chlorpheniramine-HYDROcodone (TUSSIONEX PENNKINETIC ER) 10-8 MG/5ML SUER Take 5 mLs by mouth at bedtime as needed. Patient not taking: Reported on 12/19/2017 12/14/17   Ivar Drape D, PA  EPINEPHrine (EPIPEN 2-PAK) 0.3 mg/0.3 mL IJ SOAJ injection Inject 0.3 mLs (0.3 mg total) into the muscle as needed (allergic reaction). 05/15/16   Whiteheart, Cristal Ford, NP  famotidine (PEPCID) 20 MG tablet Take 1 tablet (20 mg total) by  mouth 2 (two) times daily. 05/15/16 05/17/16  Marijean Heath, NP  ipratropium (ATROVENT) 0.03 % nasal spray INSTILL 2 SPRAYS INTO BOTH NOSTRILS TWICE DAILY 01/07/18   Ivar Drape D, PA  levothyroxine (SYNTHROID, LEVOTHROID) 150 MCG tablet Take 150-225 mcg by mouth daily before breakfast. 14mcg daily except on Thursday and Sunday take 244mcg    [provider]  omeprazole (PRILOSEC) 20 MG capsule Take 1 capsule (20 mg total) by mouth daily. 12/19/17   Ivar Drape D, PA  ranitidine (ZANTAC) 150 MG tablet Take 1  tablet (150 mg total) by mouth 2 (two) times daily. 02/04/18   Joretta Bachelor, PA    Family History Family History  Problem Relation Age of Onset  . Diabetes Mother   . Cancer Father     Social History Social History   Tobacco Use  . Smoking status: Never Smoker  . Smokeless tobacco: Never Used  Substance Use Topics  . Alcohol use: Yes    Alcohol/week: 0.0 standard drinks    Comment: occasionally  . Drug use: No     Allergies   Ciprofloxacin; Quinolones; and Bee venom   Review of Systems Review of Systems  Constitutional: Negative for chills and fever.  Cardiovascular: Negative for chest pain.  Gastrointestinal: Negative for abdominal pain, diarrhea, nausea and vomiting.  Musculoskeletal: Positive for back pain. Negative for neck pain.  Neurological: Negative for weakness, numbness and headaches.  All other systems reviewed and are negative.    Physical Exam Updated Vital Signs BP (!) 130/95 (BP Location: Right Arm)   Pulse 72   Temp 98.4 F (36.9 C) (Oral)   Resp 16   Ht 5' 6.5" (1.689 m)   Wt 98.4 kg   SpO2 100%   BMI 34.50 kg/m   Physical Exam  Constitutional: She appears well-developed. No distress.  HENT:  Head: Normocephalic.  Cardiovascular: Normal rate, normal heart sounds and intact distal pulses.  2+ DP/PT pulses bilaterally.  Abdominal: Soft. She exhibits no distension. There is no tenderness. There is no guarding.  Musculoskeletal: Normal range of motion. She exhibits no tenderness.  Normal gait.  Left buttock is tender to palpation generally over the piriformis muscle.  Palpation over this area both re-creates and exacerbates her reported pain and radiation.  T/L-spine palpated without midline tenderness to palpation, step-offs, or deformities.  Mild tenderness to palpation over left-sided lumbar paraspinal muscles.  Neurological: She is alert. No sensory deficit.  Skin: Skin is warm and dry. She is not diaphoretic.  Psychiatric: She  has a normal mood and affect.  Nursing note and vitals reviewed.    ED Treatments / Results  Labs (all labs ordered are listed, but only abnormal results are displayed) Labs Reviewed - No data to display  EKG None  Radiology No results found.  Procedures Procedures (including critical care time)  Medications Ordered in ED Medications - No data to display   Initial Impression / Assessment and Plan / ED Course  I have reviewed the triage vital signs and the nursing notes.  Pertinent labs & imaging results that were available during my care of the patient were reviewed by me and considered in my medical decision making (see chart for details).     Normal neurological exam, no evidence of urinary incontinence or retention, pain is consistently reproducible. There is no evidence of AAA or concern for dissection at this time.   Patient can walk but states is painful.  No loss of bowel or bladder  control.  No concern for cauda equina.  No fever, night sweats, weight loss, h/o cancer, IVDU.  RICE protocol and pain medicine indicated and discussed with patient. I have also discussed reasons to return immediately to the ER.  Patient expresses understanding and agrees with plan.     Final Clinical Impressions(s) / ED Diagnoses   Final diagnoses:  Left leg pain  Sciatica of left side    ED Discharge Orders    None       Ollen Gross 08/18/18 2240    Milton Ferguson, MD 08/20/18 [phone]

## 2018-08-18 NOTE — Discharge Instructions (Addendum)
I have given you some information about piriformis syndrome, and some exercises that you can do.  Please try to do each of these exercises 15 times a day twice a day.  You can build up as tolerated from there.  Please take Ibuprofen (Advil, motrin) and Tylenol (acetaminophen) to relieve your pain.  You may take up to 600 MG (3 pills) of normal strength ibuprofen every 8 hours as needed.  In between doses of ibuprofen you make take tylenol, up to 1,000 mg (two extra strength pills).  Do not take more than 3,000 mg tylenol in a 24 hour period.  Please check all medication labels as many medications such as pain and cold medications may contain tylenol.  Do not drink alcohol while taking these medications.  Do not take other NSAID'S while taking ibuprofen (such as aleve or naproxen).  Please take ibuprofen with food to decrease stomach upset.

## 2018-09-25 ENCOUNTER — Encounter: Payer: Self-pay | Admitting: Family Medicine

## 2018-09-25 ENCOUNTER — Ambulatory Visit: Payer: Federal, State, Local not specified - PPO | Admitting: Family Medicine

## 2018-09-25 VITALS — BP 140/104 | HR 80 | Temp 98.6°F | Ht 66.5 in | Wt 216.6 lb

## 2018-09-25 DIAGNOSIS — M5432 Sciatica, left side: Secondary | ICD-10-CM | POA: Diagnosis not present

## 2018-09-25 DIAGNOSIS — Z0000 Encounter for general adult medical examination without abnormal findings: Secondary | ICD-10-CM | POA: Diagnosis not present

## 2018-09-25 DIAGNOSIS — I10 Essential (primary) hypertension: Secondary | ICD-10-CM

## 2018-09-25 LAB — BASIC METABOLIC PANEL
BUN: 16 mg/dL (ref 6–23)
CHLORIDE: 104 meq/L (ref 96–112)
CO2: 26 mEq/L (ref 19–32)
CREATININE: 0.91 mg/dL (ref 0.40–1.20)
Calcium: 10.1 mg/dL (ref 8.4–10.5)
GFR: 84.73 mL/min (ref >60.00)
Glucose, Bld: 115 mg/dL — ABNORMAL HIGH (ref 70–99)
POTASSIUM: 4 meq/L (ref 3.5–5.1)
Sodium: 139 mEq/L (ref 135–145)

## 2018-09-25 LAB — LIPID PANEL
CHOLESTEROL: 206 mg/dL — AB (ref 0–200)
HDL: 46.7 mg/dL (ref >39.00)
LDL CALC: 143 mg/dL — AB (ref 0–99)
NonHDL: 159.38
TRIGLYCERIDES: 84 mg/dL (ref 0.0–149.0)
Total CHOL/HDL Ratio: 4
VLDL: 16.8 mg/dL (ref 0.0–40.0)

## 2018-09-25 LAB — AST: AST: 17 U/L (ref 0–37)

## 2018-09-25 LAB — ALT: ALT: 17 U/L (ref 0–35)

## 2018-09-25 MED ORDER — PREDNISONE 20 MG PO TABS: ORAL_TABLET | ORAL | 0 refills | Status: DC

## 2018-09-25 MED ORDER — LISINOPRIL 10 MG PO TABS: 10.0000 mg | ORAL_TABLET | Freq: Every day | ORAL | 3 refills | Status: DC
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
## 2018-10-04 ENCOUNTER — Encounter: Payer: Self-pay | Admitting: Family Medicine

## 2018-10-04 ENCOUNTER — Ambulatory Visit: Payer: Federal, State, Local not specified - PPO | Admitting: Family Medicine

## 2018-10-04 VITALS — BP 130/88 | HR 92 | Temp 98.1°F | Ht 66.5 in | Wt 216.0 lb

## 2018-10-04 DIAGNOSIS — R0982 Postnasal drip: Secondary | ICD-10-CM

## 2018-10-04 DIAGNOSIS — Z6834 Body mass index (BMI) 34.0-34.9, adult: Secondary | ICD-10-CM

## 2018-10-04 DIAGNOSIS — R0683 Snoring: Secondary | ICD-10-CM

## 2018-10-04 DIAGNOSIS — I10 Essential (primary) hypertension: Secondary | ICD-10-CM | POA: Diagnosis not present

## 2018-10-04 DIAGNOSIS — M5432 Sciatica, left side: Secondary | ICD-10-CM | POA: Diagnosis not present

## 2018-10-04 DIAGNOSIS — Z9189 Other specified personal risk factors, not elsewhere classified: Secondary | ICD-10-CM

## 2018-10-04 DIAGNOSIS — R05 Cough: Secondary | ICD-10-CM

## 2018-10-04 DIAGNOSIS — R059 Cough, unspecified: Secondary | ICD-10-CM

## 2018-10-04 MED ORDER — CETIRIZINE HCL 10 MG PO TABS: 10.0000 mg | ORAL_TABLET | Freq: Every day | ORAL | 3 refills | Status: DC

## 2018-10-04 MED ORDER — AMLODIPINE BESYLATE 10 MG PO TABS: 10.0000 mg | ORAL_TABLET | Freq: Every day | ORAL | 3 refills | Status: DC

## 2018-10-04 MED ORDER — NAPROXEN 500 MG PO TABS: 500.0000 mg | ORAL_TABLET | Freq: Two times a day (BID) | ORAL | 3 refills | Status: DC

## 2018-10-04 MED ORDER — FLUTICASONE PROPIONATE 50 MCG/ACT NA SUSP: 2.0000 | Freq: Every day | NASAL | 6 refills | Status: DC

## 2018-10-04 NOTE — Progress Notes (Signed)
Melanie Roberts is a 47 y.o. female  Chief Complaint  Patient presents with  . Medication Problem    pt would like to change medication (lisinoril) vision related     HPI: Melanie Roberts is a 47 y.o. female here for f/u on Lt sciatica. Pain much improved. Sleep is better. She still feels like her leg is "heavy".  Using heating pad Exercises - pt is doing a few of these each day She is on the last two days of prednisone taper taking 1/2 tab x 2 days. She is not able to do PT d/t her work schedule  Pt also complains of + PND, cough, nasal congestion. Symptoms worsen in AM and at night when laying down. No SOB, wheeze. She is Rx'd zyrtec but takes only occasionally.   Pt also requests sleep study d/t loud snoring, witnessed apneic episodes during sleep (boyfriend, son), daytime fatigue. Symptoms x months-years.   Past Medical History:  Diagnosis Date  . Headache(784.0)    migraines  . Hemorrhoids 12/2012  . Hypothyroidism   . Wears partial dentures    upper and lower    Past Surgical History:  Procedure Laterality Date  . ADENOIDECTOMY  02/17/2005  . CESAREAN SECTION  07/08/2003  . EVALUATION UNDER ANESTHESIA WITH HEMORRHOIDECTOMY N/A 01/14/2013   Procedure: EXAM UNDER ANESTHESIA WITH HEMORRHOIDECTOMY POSSIBLE BANDING;  Surgeon: Madilyn Hook, DO;  Location: [facility];  Service: General;  Laterality: N/A;  . LAPAROSCOPIC TOTAL HYSTERECTOMY  10/29/2006  . MYOMECTOMY  12/18/2001   x 4  . NASAL TURBINATE REDUCTION Bilateral 02/07/2005   inferior  . TONSILLECTOMY AND ADENOIDECTOMY  age 75  . TUBAL LIGATION  07/08/2003    Social History   Socioeconomic History  . Marital status: Single    Spouse name: Not on file  . Number of children: Not on file  . Years of education: Not on file  . Highest education level: Not on file  Occupational History  . Not on file  Social Needs  . Financial resource strain: Not on file  . Food insecurity:    Worry:  Not on file    Inability: Not on file  . Transportation needs:    Medical: Not on file    Non-medical: Not on file  Tobacco Use  . Smoking status: Never Smoker  . Smokeless tobacco: Never Used  Substance and Sexual Activity  . Alcohol use: Yes    Alcohol/week: 0.0 standard drinks    Comment: occasionally  . Drug use: No  . Sexual activity: Not on file  Lifestyle  . Physical activity:    Days per week: Not on file    Minutes per session: Not on file  . Stress: Not on file  Relationships  . Social connections:    Talks on phone: Not on file    Gets together: Not on file    Attends religious service: Not on file    Active member of club or organization: Not on file    Attends meetings of clubs or organizations: Not on file    Relationship status: Not on file  . Intimate partner violence:    Fear of current or ex partner: Not on file    Emotionally abused: Not on file    Physically abused: Not on file    Forced sexual activity: Not on file  Other Topics Concern  . Not on file  Social History Narrative  . Not on file    Family History  Problem Relation Age of Onset  . Diabetes Mother   . Cancer Father      Immunization History  Administered Date(s) Administered  . DTaP 08/07/1970, 11/17/1971  . MMR 06/30/1971, 07/06/1972  . Td 05/05/1988, 07/06/2011  . Varicella 12/21/1975    Outpatient Encounter Medications as of 10/04/2018  Medication Sig  . albuterol (PROVENTIL HFA;VENTOLIN HFA) 108 (90 Base) MCG/ACT inhaler Inhale 2 puffs into the lungs every 4 (four) hours as needed for wheezing or shortness of breath.  Marland Kitchen albuterol (PROVENTIL HFA;VENTOLIN HFA) 108 (90 Base) MCG/ACT inhaler Inhale 2 puffs into the lungs every 4 (four) hours as needed for wheezing or shortness of breath (cough, shortness of breath or wheezing.).  Marland Kitchen cetirizine (ZYRTEC) 10 MG tablet Take 1 tablet (10 mg total) by mouth daily.  . diphenhydrAMINE (BENADRYL) 25 MG tablet Take 1 tablet once a day as  needed for allergies  . EPINEPHrine (EPIPEN 2-PAK) 0.3 mg/0.3 mL IJ SOAJ injection Inject 0.3 mLs (0.3 mg total) into the muscle as needed (allergic reaction).  Marland Kitchen ipratropium (ATROVENT) 0.03 % nasal spray INSTILL 2 SPRAYS INTO BOTH NOSTRILS TWICE DAILY  . levothyroxine (SYNTHROID, LEVOTHROID) 150 MCG tablet Take 150-225 mcg by mouth daily before breakfast. 166mg daily except on Thursday and Sunday take 2224m  . omeprazole (PRILOSEC) 20 MG capsule Take 1 capsule (20 mg total) by mouth daily.  . ranitidine (ZANTAC) 150 MG tablet Take 1 tablet (150 mg total) by mouth 2 (two) times daily.  . [DISCONTINUED] cetirizine (ZYRTEC) 10 MG tablet Take 1 tablet (10 mg total) by mouth daily.  . [DISCONTINUED] lisinopril (PRINIVIL,ZESTRIL) 10 MG tablet Take 1 tablet (10 mg total) by mouth daily.  . [DISCONTINUED] predniSONE (DELTASONE) 20 MG tablet Take 3 tabs po x 3 days, 2 tabs po x 3 days, 1 tab po x 3 days, 1/2 tab po x 3 days  . amLODipine (NORVASC) 10 MG tablet Take 1 tablet (10 mg total) by mouth daily.  . famotidine (PEPCID) 20 MG tablet Take 1 tablet (20 mg total) by mouth 2 (two) times daily.  . fluticasone (FLONASE) 50 MCG/ACT nasal spray Place 2 sprays into both nostrils daily.  . naproxen (NAPROSYN) 500 MG tablet Take 1 tablet (500 mg total) by mouth 2 (two) times daily with a meal.   No facility-administered encounter medications on file as of 10/04/2018.      ROS: Gen: no fever, chills  Skin: no rash, itching ENT: no ear pain, ear drainage; + nasal congestion, rhinorrhea; no sinus pressure, sore throat Eyes: no blurry vision, double vision Resp: + cough; no wheeze,SOB CV: no CP, palpitations, LE edema,  GI: no heartburn, n/v/d/c, abd pain GU: no dysuria, urgency, frequency, hematuria  MSK: no joint pain, myalgias; + back pain w/ radiation to Lt leg Neuro: no dizziness, headache, weakness, vertigo Psych: no depression, anxiety; + insomnia   Allergies  Allergen Reactions  .  Ciprofloxacin Anaphylaxis  . Quinolones Anaphylaxis  . Bee Venom Swelling    BP 130/88 (BP Location: Right Arm, Patient Position: Sitting, Cuff Size: Normal)   Pulse 92   Temp 98.1 F (36.7 C) (Oral)   Ht 5' 6.5" (1.689 m)   Wt 216 lb (98 kg)   SpO2 97%   BMI 34.34 kg/m   Physical Exam  Constitutional: She is oriented to person, place, and time. She appears well-developed and well-nourished. No distress.  HENT:  Head: Normocephalic and atraumatic.  Right Ear: A middle ear effusion (serous effusion B/L) is present.  Left Ear: A middle ear effusion is present.  Nose: Mucosal edema present. Right sinus exhibits no maxillary sinus tenderness and no frontal sinus tenderness. Left sinus exhibits no maxillary sinus tenderness and no frontal sinus tenderness.  Mouth/Throat: Mucous membranes are normal. Posterior oropharyngeal erythema present. No oropharyngeal exudate or posterior oropharyngeal edema.  Pulmonary/Chest: Effort normal and breath sounds normal. No respiratory distress. She has no wheezes. She has no rhonchi.  Musculoskeletal: Normal range of motion.        General: No tenderness.  Neurological: She is alert and oriented to person, place, and time.  Skin: Skin is warm and dry.  Psychiatric: She has a normal mood and affect.     A/P:  1. Essential hypertension - pt developed cough with lisinopril Rx: - amLODipine (NORVASC) 10 MG tablet; Take 1 tablet (10 mg total) by mouth daily.  Dispense: 90 tablet; Refill: 3 - check BP at home (pt just purchased home cuff) 3x/wk x 2 wks and send MyChart message with readings - f/u in office in 3 mo  2. Sciatica, left side - significant improvement with prednisone taper, heating pad, exercises - cont with heating pad, exercises. Pt is not able to do PT d/t work schedule/hours Rx: - naproxen (NAPROSYN) 500 MG tablet; Take 1 tablet (500 mg total) by mouth 2 (two) times daily with a meal.  Dispense: 60 tablet; Refill: 3  3. PND  (post-nasal drip) 4. Cough Rx: - fluticasone (FLONASE) 50 MCG/ACT nasal spray; Place 2 sprays into both nostrils daily.  Dispense: 16 g; Refill: 6 - cetirizine (ZYRTEC) 10 MG tablet; Take 1 tablet (10 mg total) by mouth daily.  Dispense: 90 tablet; Refill: 3 - nasal saline spray TID and PRN - f/u PRN  5. Snoring 6. BMI 34.0-34.9,adult 7. At risk for sleep apnea - [facility] Sleep Study; Future

## 2018-10-04 NOTE — Patient Instructions (Addendum)
Nasal saline spray 3x/day then as needed flonase 2 sprays each nostril daily Take zyrtec 1 tab daily

## 2018-10-09 ENCOUNTER — Ambulatory Visit: Payer: Self-pay | Admitting: *Deleted

## 2018-10-09 ENCOUNTER — Telehealth: Payer: Self-pay

## 2018-10-09 MED ORDER — BENZONATATE 100 MG PO CAPS: 100.0000 mg | ORAL_CAPSULE | Freq: Three times a day (TID) | ORAL | 0 refills | Status: DC | PRN

## 2018-10-09 NOTE — Telephone Encounter (Signed)
Copied from [place] [id number]. Topic: General - Inquiry >> Oct 09, 2018  9:24 AM Conception Chancy, NT wrote: Reason for CRM: patient was seen by Dr. Cathleen Corti on 10/04/18  and states that her cough is not better and would like to know if something else could be called in.

## 2018-10-09 NOTE — Telephone Encounter (Signed)
Pt was seen by me and examined on 10/04/18 and did not have bronchitis at that time. The cough has been ongoing for months. It is very likely related to post-nasal drip. Will send Rx for tessalon capsules 1 cap TID PRN cough. But I feel pt should continue with flonase, zyrtec or claritin daily, and nasal saline spray 2-3x/day

## 2018-10-09 NOTE — Telephone Encounter (Signed)
Dr.C please advise

## 2018-10-09 NOTE — Telephone Encounter (Signed)
Pt c/o having a cough that has been going on for a while. C/O a sore throat. She was seen in the office on 12/13.  She has a hx of bronchitis. The cough is nonproductive most of the time and when it is productive, the sputum is white. Denies fever. Has been drinking hot tea with lemon and honey, cough drops, and fluids.  Nothing seems to help. Worst at night. Advised to try a humidifier and honey by mouth. And to gargle with warm salt water for her sore throat. She is requesting a cough med to be called in to her pharmacy. Pt requesting a call back. Routing to [facility] at [facility] for review.  Reason for Disposition . Cough  Answer Assessment - Initial Assessment Questions 1. ONSET: "When did the cough begin?"      Since before 12/13 2. SEVERITY: "How bad is the cough today?"     Worst at night  3. RESPIRATORY DISTRESS: "Describe your breathing."      Shortness of breath with the cough 4. FEVER: "Do you have a fever?" If so, ask: "What is your temperature, how was it measured, and when did it start?"     no 5. HEMOPTYSIS: "Are you coughing up any blood?" If so ask: "How much?" (flecks, streaks, tablespoons, etc.)     no 6. TREATMENT: "What have you done so far to treat the cough?" (e.g., meds, fluids, humidifier)     Fluids, meds 7. CARDIAC HISTORY: "Do you have any history of heart disease?" (e.g., heart attack, congestive heart failure)      Recently dx with HBP 8. LUNG HISTORY: "Do you have any history of lung disease?"  (e.g., pulmonary embolus, asthma, emphysema)     no 9. PE RISK FACTORS: "Do you have a history of blood clots?" (or: recent major surgery, recent prolonged travel, bedridden)     no 10. OTHER SYMPTOMS: "Do you have any other symptoms? (e.g., runny nose, wheezing, chest pain)       Wheezing at times 11. PREGNANCY: "Is there any chance you are pregnant?" "When was your last menstrual period?"       n/a 12. TRAVEL: "Have you traveled out of the country in the  last month?" (e.g., travel history, exposures)       no  Protocols used: COUGH - ACUTE NON-PRODUCTIVE-A-AH

## 2018-10-09 NOTE — Addendum Note (Signed)
Addended by: Ronnald Nian on: 10/09/2018 04:31 PM   Modules accepted: Orders

## 2018-10-09 NOTE — Telephone Encounter (Signed)
I saw pt 5 days ago and advised flonase 2 sprays each nostril daily and 1 tab of claritin/zyrtec daily, along with nasal saline spray 2-3x/day. I believe the cough is due to post nasal drip and allergies. This can take up to 2 weeks to be effective. She noted increased dry cough when on lisinopril which was d/c'd. Cough d/t lisinopril can take 2 wks to resolve. If no improvement in 7-10 days, We can discuss alternate/additional treatment options

## 2018-10-23 HISTORY — PX: ROTATOR CUFF REPAIR: SHX139

## 2018-12-03 ENCOUNTER — Ambulatory Visit: Payer: Federal, State, Local not specified - PPO | Admitting: Family Medicine

## 2018-12-03 ENCOUNTER — Encounter: Payer: Self-pay | Admitting: Family Medicine

## 2018-12-03 ENCOUNTER — Other Ambulatory Visit: Payer: Self-pay | Admitting: Family Medicine

## 2018-12-03 DIAGNOSIS — R05 Cough: Secondary | ICD-10-CM

## 2018-12-03 DIAGNOSIS — R059 Cough, unspecified: Secondary | ICD-10-CM

## 2018-12-03 MED ORDER — OMEPRAZOLE 20 MG PO CPDR: 20.0000 mg | DELAYED_RELEASE_CAPSULE | Freq: Every day | ORAL | 1 refills | Status: DC

## 2018-12-03 MED ORDER — AZITHROMYCIN 250 MG PO TABS: ORAL_TABLET | ORAL | 0 refills | Status: DC

## 2018-12-03 NOTE — Progress Notes (Signed)
Melanie Roberts - 48 y.o. female MRN [id number]  Date of birth: 01-Aug-1971  SUBJECTIVE:  Including CC & ROS.  No chief complaint on file.   Melanie Roberts is a 48 y.o. female that is presenting with cough.  This is acute on chronic for her.  She reports having taken different medications but never gets complete resolution of her cough.  It seems to be worse at night.  No tobacco use or asthma history.  Has been out of her omeprazole for couple of weeks.  She does endorse having a posttussive emesis and does feel like she gets a sore throat if she coughs for period of time.  She works night shifts.  Denies any fevers.  Review of the chest x-ray from 12/12/2017 shows a normal scan   Review of Systems  Constitutional: Negative for fever.  HENT: Positive for congestion.   Respiratory: Positive for cough.   Cardiovascular: Negative for chest pain.  Gastrointestinal: Negative for abdominal pain.  Musculoskeletal: Negative for back pain.    HISTORY: Past Medical, Surgical, Social, and Family History Reviewed & Updated per EMR.   Pertinent Historical Findings include:  Past Medical History:  Diagnosis Date  . Headache(784.0)    migraines  . Hemorrhoids 12/2012  . Hypothyroidism   . Wears partial dentures    upper and lower    Past Surgical History:  Procedure Laterality Date  . ADENOIDECTOMY  02/17/2005  . CESAREAN SECTION  07/08/2003  . EVALUATION UNDER ANESTHESIA WITH HEMORRHOIDECTOMY N/A 01/14/2013   Procedure: EXAM UNDER ANESTHESIA WITH HEMORRHOIDECTOMY POSSIBLE BANDING;  Surgeon: Madilyn Hook, DO;  Location: [facility];  Service: General;  Laterality: N/A;  . LAPAROSCOPIC TOTAL HYSTERECTOMY  10/29/2006  . MYOMECTOMY  12/18/2001   x 4  . NASAL TURBINATE REDUCTION Bilateral 02/07/2005   inferior  . TONSILLECTOMY AND ADENOIDECTOMY  age 36  . TUBAL LIGATION  07/08/2003    Allergies  Allergen Reactions  . Ciprofloxacin Anaphylaxis  . Quinolones  Anaphylaxis  . Bee Venom Swelling    Family History  Problem Relation Age of Onset  . Diabetes Mother   . Cancer Father      Social History   Socioeconomic History  . Marital status: Single    Spouse name: Not on file  . Number of children: Not on file  . Years of education: Not on file  . Highest education level: Not on file  Occupational History  . Not on file  Social Needs  . Financial resource strain: Not on file  . Food insecurity:    Worry: Not on file    Inability: Not on file  . Transportation needs:    Medical: Not on file    Non-medical: Not on file  Tobacco Use  . Smoking status: Never Smoker  . Smokeless tobacco: Never Used  Substance and Sexual Activity  . Alcohol use: Yes    Alcohol/week: 0.0 standard drinks    Comment: occasionally  . Drug use: No  . Sexual activity: Not on file  Lifestyle  . Physical activity:    Days per week: Not on file    Minutes per session: Not on file  . Stress: Not on file  Relationships  . Social connections:    Talks on phone: Not on file    Gets together: Not on file    Attends religious service: Not on file    Active member of club or organization: Not on file    Attends meetings of clubs  or organizations: Not on file    Relationship status: Not on file  . Intimate partner violence:    Fear of current or ex partner: Not on file    Emotionally abused: Not on file    Physically abused: Not on file    Forced sexual activity: Not on file  Other Topics Concern  . Not on file  Social History Narrative  . Not on file     PHYSICAL EXAM:  VS: BP 122/90   Pulse 92   Temp 98.6 F (37 C) (Oral)   Ht 5' 6.5" (1.689 m)   Wt 216 lb (98 kg)   SpO2 92%   BMI 34.34 kg/m  Physical Exam Gen: NAD, alert, cooperative with exam,  ENT: normal lips, normal nasal mucosa, tympanic membranes clear and intact bilaterally, normal oropharynx, no cervical lymphadenopathy Eye: normal EOM, normal conjunctiva and lids CV:  no  edema, +2 pedal pulses, regular rate and rhythm, S1-S2   Resp: no accessory muscle use, non-labored, clear to auscultation bilaterally, no crackles or wheezes Skin: no rashes, no areas of induration  Neuro: normal tone, normal sensation to touch Psych:  normal insight, alert and oriented MSK: Normal gait, normal strength        ASSESSMENT & PLAN:   Cough Possible for GERD. Possible for viral component. No history of asthma.  - azithromycin  - if no improvement consider imaging vs referral to GI.

## 2018-12-03 NOTE — Patient Instructions (Signed)
Nice to meet you  Please try the medicine  Please follow up with Dr. Loletha Grayer if your symptoms don't improve.

## 2018-12-03 NOTE — Assessment & Plan Note (Signed)
Possible for GERD. Possible for viral component. No history of asthma.  - azithromycin  - if no improvement consider imaging vs referral to GI.

## 2018-12-04 ENCOUNTER — Other Ambulatory Visit: Payer: Self-pay | Admitting: Family Medicine

## 2018-12-04 DIAGNOSIS — R0683 Snoring: Secondary | ICD-10-CM

## 2018-12-04 DIAGNOSIS — Z6834 Body mass index (BMI) 34.0-34.9, adult: Secondary | ICD-10-CM

## 2018-12-04 NOTE — Progress Notes (Signed)
Referral for sleep study placed ?

## 2018-12-11 ENCOUNTER — Ambulatory Visit: Payer: Federal, State, Local not specified - PPO | Admitting: Family Medicine

## 2018-12-11 ENCOUNTER — Encounter: Payer: Self-pay | Admitting: Family Medicine

## 2018-12-11 ENCOUNTER — Ambulatory Visit (INDEPENDENT_AMBULATORY_CARE_PROVIDER_SITE_OTHER): Payer: Federal, State, Local not specified - PPO

## 2018-12-11 ENCOUNTER — Other Ambulatory Visit: Payer: Self-pay | Admitting: Family Medicine

## 2018-12-11 VITALS — BP 128/90 | HR 81 | Temp 98.0°F | Ht 66.5 in | Wt 229.2 lb

## 2018-12-11 DIAGNOSIS — R600 Localized edema: Secondary | ICD-10-CM

## 2018-12-11 DIAGNOSIS — R053 Chronic cough: Secondary | ICD-10-CM

## 2018-12-11 DIAGNOSIS — K219 Gastro-esophageal reflux disease without esophagitis: Secondary | ICD-10-CM

## 2018-12-11 DIAGNOSIS — R05 Cough: Secondary | ICD-10-CM | POA: Diagnosis not present

## 2018-12-11 LAB — BRAIN NATRIURETIC PEPTIDE: Pro B Natriuretic peptide (BNP): 24 pg/mL (ref 0.0–100.0)

## 2018-12-11 MED ORDER — OMEPRAZOLE 40 MG PO CPDR: 40.0000 mg | DELAYED_RELEASE_CAPSULE | Freq: Every day | ORAL | 3 refills | Status: DC

## 2018-12-11 MED ORDER — FUROSEMIDE 20 MG PO TABS: 20.0000 mg | ORAL_TABLET | Freq: Every day | ORAL | 1 refills | Status: DC | PRN

## 2018-12-11 NOTE — Progress Notes (Signed)
Melanie Roberts is a 48 y.o. female  Chief Complaint  Patient presents with  . Cough    cough, vomiting, food not staying down,loss of appetite/ 1 month/osemeprozole, and rantidine given    HPI: Melanie Roberts is a 48 y.o. female here for ongoing cough since 07/2018.  Cough occurs daytime and nighttime. Pt works nights and sleeps during day. Cough is worse for pt during the day when she is sleeping. It wakes her up from sleep.  She also complains of decreased appetite, feels full most of the time. + nausea. + post-tussive emesis. + sour taste in mouth. Symptoms all x 1+ mo. No diarrhea, constipation. She notes certain foods - rice, meat - get coughed up within an hour or so of eating.  Pt states she wheezes at times, uses her inhaler which helps with wheeze, but not the cough.  She has been on antibiotics, cough medicine without much improvement, if any. She takes prilosec 74m daily and zantac 1527mBID.  She uses flonase and zyrtec daily for allergies.  She notes some LE edema which is not new and not worse than normal. It is not present all of the time and is noticeable at the end of her workday (night). She does endorse being SOB with exertion which was not the case 2+ mo ago.   Past Medical History:  Diagnosis Date  . Headache(784.0)    migraines  . Hemorrhoids 12/2012  . Hypothyroidism   . Wears partial dentures    upper and lower    Past Surgical History:  Procedure Laterality Date  . ADENOIDECTOMY  02/17/2005  . CESAREAN SECTION  07/08/2003  . EVALUATION UNDER ANESTHESIA WITH HEMORRHOIDECTOMY N/A 01/14/2013   Procedure: EXAM UNDER ANESTHESIA WITH HEMORRHOIDECTOMY POSSIBLE BANDING;  Surgeon: BrMadilyn HookDO;  [place] [facility] Service: General;  Laterality: N/A;  . LAPAROSCOPIC TOTAL HYSTERECTOMY  10/29/2006  . MYOMECTOMY  12/18/2001   x 4  . NASAL TURBINATE REDUCTION Bilateral 02/07/2005   inferior  . TONSILLECTOMY AND ADENOIDECTOMY  age 62 24 . TUBAL LIGATION  07/08/2003    Social History   Socioeconomic History  . Marital status: Single    Spouse name: Not on file  . Number of children: Not on file  . Years of education: Not on file  . Highest education level: Not on file  Occupational History  . Not on file  Social Needs  . Financial resource strain: Not on file  . Food insecurity:    Worry: Not on file    Inability: Not on file  . Transportation needs:    Medical: Not on file    Non-medical: Not on file  Tobacco Use  . Smoking status: Never Smoker  . Smokeless tobacco: Never Used  Substance and Sexual Activity  . Alcohol use: Yes    Alcohol/week: 0.0 standard drinks    Comment: occasionally  . Drug use: No  . Sexual activity: Not on file  Lifestyle  . Physical activity:    Days per week: Not on file    Minutes per session: Not on file  . Stress: Not on file  Relationships  . Social connections:    Talks on phone: Not on file    Gets together: Not on file    Attends religious service: Not on file    Active member of club or organization: Not on file    Attends meetings of clubs or organizations: Not on file    Relationship  status: Not on file  . Intimate partner violence:    Fear of current or ex partner: Not on file    Emotionally abused: Not on file    Physically abused: Not on file    Forced sexual activity: Not on file  Other Topics Concern  . Not on file  Social History Narrative  . Not on file    Family History  Problem Relation Age of Onset  . Diabetes Mother   . Cancer Father      Immunization History  Administered Date(s) Administered  . DTaP 08/07/1970, 11/17/1971  . MMR 06/30/1971, 07/06/1972  . Td 05/05/1988, 07/06/2011  . Varicella 12/21/1975    Outpatient Encounter Medications as of 12/11/2018  Medication Sig  . albuterol (PROVENTIL HFA;VENTOLIN HFA) 108 (90 Base) MCG/ACT inhaler Inhale 2 puffs into the lungs every 4 (four) hours as needed for wheezing or shortness of  breath.  Marland Kitchen albuterol (PROVENTIL HFA;VENTOLIN HFA) 108 (90 Base) MCG/ACT inhaler Inhale 2 puffs into the lungs every 4 (four) hours as needed for wheezing or shortness of breath (cough, shortness of breath or wheezing.).  Marland Kitchen amLODipine (NORVASC) 10 MG tablet Take 1 tablet (10 mg total) by mouth daily.  . cetirizine (ZYRTEC) 10 MG tablet Take 1 tablet (10 mg total) by mouth daily.  . diphenhydrAMINE (BENADRYL) 25 MG tablet Take 1 tablet once a day as needed for allergies  . EPINEPHrine (EPIPEN 2-PAK) 0.3 mg/0.3 mL IJ SOAJ injection Inject 0.3 mLs (0.3 mg total) into the muscle as needed (allergic reaction).  . fluticasone (FLONASE) 50 MCG/ACT nasal spray Place 2 sprays into both nostrils daily.  Marland Kitchen ipratropium (ATROVENT) 0.03 % nasal spray INSTILL 2 SPRAYS INTO BOTH NOSTRILS TWICE DAILY  . levothyroxine (SYNTHROID, LEVOTHROID) 150 MCG tablet Take 150-225 mcg by mouth daily before breakfast. 135mg daily except on Thursday and Sunday take 2273m  . naproxen (NAPROSYN) 500 MG tablet Take 1 tablet (500 mg total) by mouth 2 (two) times daily with a meal.  . ranitidine (ZANTAC) 150 MG tablet Take 1 tablet (150 mg total) by mouth 2 (two) times daily.  . [DISCONTINUED] omeprazole (PRILOSEC) 20 MG capsule TAKE ONE CAPSULE BY MOUTH DAILY  . furosemide (LASIX) 20 MG tablet Take 1 tablet (20 mg total) by mouth daily as needed.  . Marland Kitchenmeprazole (PRILOSEC) 40 MG capsule Take 1 capsule (40 mg total) by mouth daily.  . [DISCONTINUED] azithromycin (ZITHROMAX) 250 MG tablet Take 500 mg the first day then 250 mg the next 4 days. Total of 5 days. (Patient not taking: Reported on 12/11/2018)  . [DISCONTINUED] benzonatate (TESSALON) 100 MG capsule Take 1 capsule (100 mg total) by mouth 3 (three) times daily as needed for cough. (Patient not taking: Reported on 12/11/2018)  . [DISCONTINUED] famotidine (PEPCID) 20 MG tablet Take 1 tablet (20 mg total) by mouth 2 (two) times daily.   No facility-administered encounter medications  on file as of 12/11/2018.      ROS: Pertinent positives and negatives noted in HPI. Remainder of ROS non-contributory   Allergies  Allergen Reactions  . Ciprofloxacin Anaphylaxis  . Quinolones Anaphylaxis  . Bee Venom Swelling    BP 128/90   Pulse 81   Temp 98 F (36.7 C) (Oral)   Ht 5' 6.5" (1.689 m)   Wt 229 lb 3.2 oz (104 kg)   SpO2 96%   BMI 36.44 kg/m   Wt Readings from Last 3 Encounters:  12/11/18 229 lb 3.2 oz (104 kg)  12/03/18 216 lb (  98 kg)  10/04/18 216 lb (98 kg)    Physical Exam  Constitutional: She is oriented to person, place, and time. She appears well-developed and well-nourished. No distress.  Cardiovascular: Normal rate and regular rhythm.  No murmur heard. Pulmonary/Chest: Effort normal and breath sounds normal. No respiratory distress. She has no wheezes. She has no rhonchi.  Abdominal: Soft. Bowel sounds are normal. She exhibits no distension and no mass. There is abdominal tenderness (epigastric TTP). There is no rebound and no guarding.  Musculoskeletal:        General: Edema (+2 B/L LE edema) present.  Neurological: She is alert and oriented to person, place, and time.  Psychiatric: She has a normal mood and affect. Her behavior is normal.     A/P:  1. Persistent cough for 3 weeks or longer - I believe this is related to her GERD as she has developed other GI symptoms (decreased appetite, nausea, dysphagia) since cough started in 10-08/2018. She has been treated at [facility] and by my colleague with abx, tessalon pearls and other cough medication without improvement. She uses her albuterol inhaler without improvement in the cough. Will get CXR to confirm no pulm issue and also to look for cardiomegaly, effusion, etc - DG Chest 2 View  2. Lower extremity edema - DG Chest 2 View - B Nat Peptide - furosemide (LASIX) 20 MG tablet; Take 1 tablet (20 mg total) by mouth daily as needed.  Dispense: 30 tablet; Refill: 1  3. Gastroesophageal reflux disease,  esophagitis presence not specified Rx: - omeprazole (PRILOSEC) 40 MG capsule; Take 1 capsule (40 mg total) by mouth daily.  Dispense: 90 capsule; Refill: 3 - increased from 50m daily - Ambulatory referral to Gastroenterology

## 2018-12-11 NOTE — Patient Instructions (Addendum)
Increase omeprazole from 20mg  to 40mg  (2 20mg  tabs) daily 47min before breakfast Referral placed to GI today Chest xray and lab test today Take lasix 20mg  1 tab daily x 3-4 days then stop - to get fluid off

## 2018-12-12 ENCOUNTER — Encounter: Payer: Self-pay | Admitting: Gastroenterology

## 2018-12-20 ENCOUNTER — Ambulatory Visit (HOSPITAL_BASED_OUTPATIENT_CLINIC_OR_DEPARTMENT_OTHER)
Admission: RE | Admit: 2018-12-20 | Discharge: 2018-12-20 | Disposition: A | Discharge status: Home / Self Care | Payer: Federal, State, Local not specified - PPO | Source: Ambulatory Visit | Attending: Gastroenterology | Admitting: Gastroenterology

## 2018-12-20 ENCOUNTER — Encounter: Payer: Self-pay | Admitting: Gastroenterology

## 2018-12-20 ENCOUNTER — Ambulatory Visit: Payer: Federal, State, Local not specified - PPO | Admitting: Gastroenterology

## 2018-12-20 VITALS — BP 136/90 | HR 88 | Ht 66.5 in | Wt 225.0 lb

## 2018-12-20 DIAGNOSIS — R1310 Dysphagia, unspecified: Secondary | ICD-10-CM | POA: Diagnosis not present

## 2018-12-20 DIAGNOSIS — R053 Chronic cough: Secondary | ICD-10-CM

## 2018-12-20 DIAGNOSIS — R1013 Epigastric pain: Secondary | ICD-10-CM

## 2018-12-20 DIAGNOSIS — R1110 Vomiting, unspecified: Secondary | ICD-10-CM | POA: Diagnosis not present

## 2018-12-20 DIAGNOSIS — R110 Nausea: Secondary | ICD-10-CM

## 2018-12-20 DIAGNOSIS — R05 Cough: Secondary | ICD-10-CM | POA: Diagnosis not present

## 2018-12-20 DIAGNOSIS — K760 Fatty (change of) liver, not elsewhere classified: Secondary | ICD-10-CM | POA: Diagnosis not present

## 2018-12-20 MED ORDER — OMEPRAZOLE 40 MG PO CPDR: 40.0000 mg | DELAYED_RELEASE_CAPSULE | Freq: Two times a day (BID) | ORAL | 0 refills | Status: DC

## 2018-12-20 NOTE — Progress Notes (Signed)
Chief Complaint: Nausea/vomiting, epigastric pain, cough  Referring Provider:     Ronnald Nian, DO  HPI:    Melanie Roberts is a 48 y.o. female referred to the Gastroenterology Clinic for evaluation of chronic dry cough along with decreased appetite, and nausea/vomiting, dysphagia, epigastric pain, and sour brash.  She states dry cough has been present for a few months. + Raspy voice.  Does endorse solid food dysphagia, requiring increasing water to push down over the last month or so. Points to anterior neck as site of perceived dysphagia. Has had to regurgitate food, but no impactions requiring ER evaluation.  No odynophagia.  Does have regurgitation and sour brash in the last month or so as well. Has been taking Prilosec 20 mg intermittently for a few months previous to these sxs.   Also with intermittent MEG tenderness in the last month or so. Points to area of previous robotic incision site from hysterectomy in 2008. No previous issue in that area, just in the last month.  TTP without any relationship to p.o. intake, time a day.  She was initially seen in UC and treated with antibiotic, Tessalon Perles, cough medication without improvement.  She was then seen by her PCM, Dr. Letta Median, on 12/11/2018 for this issue.  Chest x-ray negative.  BNP normal.  Started on Prilosec 40 mg daily and referred to GI for further evaluation for suspected reflux etiology.  Has had improvement, but not resolution of symptoms since starting Prilosec 40 mg daily.  Labs in 09/2018 reviewed and notable for normal AST/ALT and BMP.  Past Medical History:  Diagnosis Date  . Headache(784.0)    migraines  . Hemorrhoids 12/2012  . Hypothyroidism   . UTI (urinary tract infection)   . Wears partial dentures    upper and lower     Past Surgical History:  Procedure Laterality Date  . ADENOIDECTOMY  02/17/2005  . CESAREAN SECTION  07/08/2003  . EVALUATION UNDER ANESTHESIA WITH  HEMORRHOIDECTOMY N/A 01/14/2013   Procedure: EXAM UNDER ANESTHESIA WITH HEMORRHOIDECTOMY POSSIBLE BANDING;  Surgeon: Madilyn Hook, DO;  Location: [place];  Service: General;  Laterality: N/A;  . LAPAROSCOPIC TOTAL HYSTERECTOMY  10/29/2006  . MYOMECTOMY  12/18/2001   x 4  . NASAL TURBINATE REDUCTION Bilateral 02/07/2005   inferior and removed adenoids  . TONSILLECTOMY AND ADENOIDECTOMY  age 35  . TUBAL LIGATION  07/08/2003   Family History  Problem Relation Age of Onset  . Diabetes Mother   . Fibroids Mother        uterine   . Pancreatic cancer Father   . Breast cancer Maternal Grandmother   . Clotting disorder Maternal Grandmother   . Diabetes Maternal Grandmother   . Liver cancer Maternal Grandfather   . Diabetes Maternal Grandfather   . Pancreatic cancer Paternal Grandmother   . Diabetes Paternal Grandmother   . Stomach cancer Paternal Grandfather   . Diabetes Paternal Grandfather   . Colon cancer Paternal Uncle   . Esophageal cancer Neg Hx    Social History   Tobacco Use  . Smoking status: Never Smoker  . Smokeless tobacco: Never Used  Substance Use Topics  . Alcohol use: Yes    Alcohol/week: 0.0 standard drinks    Comment: occasionally  . Drug use: No   Current Outpatient Medications  Medication Sig Dispense Refill  . albuterol (PROVENTIL HFA;VENTOLIN HFA) 108 (90 Base) MCG/ACT inhaler Inhale 2  puffs into the lungs every 4 (four) hours as needed for wheezing or shortness of breath. 1 Inhaler 2  . albuterol (PROVENTIL HFA;VENTOLIN HFA) 108 (90 Base) MCG/ACT inhaler Inhale 2 puffs into the lungs every 4 (four) hours as needed for wheezing or shortness of breath (cough, shortness of breath or wheezing.). 1 Inhaler 1  . amLODipine (NORVASC) 10 MG tablet Take 1 tablet (10 mg total) by mouth daily. 90 tablet 3  . Biotin 1000 MCG CHEW Chew by mouth as needed.    . cetirizine (ZYRTEC) 10 MG tablet Take 1 tablet (10 mg total) by mouth daily. 90 tablet 3  .  EPINEPHrine (EPIPEN 2-PAK) 0.3 mg/0.3 mL IJ SOAJ injection Inject 0.3 mLs (0.3 mg total) into the muscle as needed (allergic reaction). 1 Device 1  . fluticasone (FLONASE) 50 MCG/ACT nasal spray Place 2 sprays into both nostrils daily. 16 g 6  . furosemide (LASIX) 20 MG tablet Take 1 tablet (20 mg total) by mouth daily as needed. 30 tablet 1  . ipratropium (ATROVENT) 0.03 % nasal spray INSTILL 2 SPRAYS INTO BOTH NOSTRILS TWICE DAILY 30 mL 0  . levothyroxine (SYNTHROID, LEVOTHROID) 150 MCG tablet Take 150-225 mcg by mouth daily before breakfast. 110mcg daily except on Thursday and Sunday take 291mcg    . naproxen (NAPROSYN) 500 MG tablet Take 1 tablet (500 mg total) by mouth 2 (two) times daily with a meal. 60 tablet 3  . omeprazole (PRILOSEC) 40 MG capsule Take 1 capsule (40 mg total) by mouth daily. 90 capsule 3   No current facility-administered medications for this visit.    Allergies  Allergen Reactions  . Ciprofloxacin Anaphylaxis  . Quinolones Anaphylaxis  . Bee Venom Swelling     Review of Systems: All systems reviewed and negative except where noted in HPI.     Physical Exam:    Wt Readings from Last 3 Encounters:  12/20/18 225 lb (102.1 kg)  12/11/18 229 lb 3.2 oz (104 kg)  12/03/18 216 lb (98 kg)    BP 136/90   Pulse 88   Ht 5' 6.5" (1.689 m)   Wt 225 lb (102.1 kg)   BMI 35.77 kg/m  Constitutional:  Pleasant, in no acute distress. Psychiatric: Normal mood and affect. Behavior is normal. EENT: Pupils normal.  Conjunctivae are normal. No scleral icterus. Neck supple. No cervical LAD. Cardiovascular: Normal rate, regular rhythm. No edema Pulmonary/chest: Effort normal and breath sounds normal. No wheezing, rales or rhonchi. Abdominal: Point tenderness just inferior to well-healed midline trocar incision site.  +Carnett's sign.  Otherwise soft, nondistended.  No peritoneal signs.  Bowel sounds active throughout. There are no masses palpable. No  hepatomegaly. Neurological: Alert and oriented to person place and time. Skin: Skin is warm and dry. No rashes noted.   ASSESSMENT AND PLAN;   Melanie Roberts is a 48 y.o. female presenting with:  1) Dysphagia: Recent onset dysphagia in the setting of atypical reflux symptoms as described below.  Discussed the DDX to include peptic stricture, Schatzki's ring, motility disorder, etc. and will evaluate as below -EGD with dilation -If EGD unrevealing, plan to proceed with Esophageal Manometry  2) Chronic Cough: Clinical presentation and evaluation to date suspicious for atypical reflux/LPR. -Will increase Prilosec to twice daily for 4 weeks for diagnostic/therapeutic intent and will evaluate for clinical improvement from suspected LPR -Evaluate for signs of reflux at time of EGD as above, to include LES laxity, erosive esophagitis, etc.  3) Abdominal pain: Point tenderness and +Carnett's  sign immediately inferior to well-healed prior trocar site/robotic incision site.  Discussed DDX with patient will evaluate as below:  -Abdominal ultrasound to rule out internal hernia from recent coughing -If EGD and ultrasound unrevealing, will plan on abdominal wall injection for suspected abdominal wall syndrome  4) Waterbrash: Discussed atypical manifestations of reflux and will evaluate for reflux changes time of EGD as above along with high-dose acid suppression therapy x4 weeks for diagnostic and therapeutic intent. -Evaluate for clinical improvement with high-dose PPI -Depending on response to therapy and endoscopic findings, may need to consider additional testing (i.e. pH/impedance)  5) Nausea: Evaluate for mucosal/luminal etiology at time of EGD as above  6) Colon cancer screening: Briefly discussed colon cancer screening.  No previous colonoscopy.  Will address acute issues as above, then discuss colonoscopy for age-appropriate CRC screening at time of follow-up appointment.  The  indications, risks, and benefits of EGD were explained to the patient in detail. Risks include but are not limited to bleeding, perforation, adverse reaction to medications, and cardiopulmonary compromise. Sequelae include but are not limited to the possibility of surgery, hositalization, and mortality. The patient verbalized understanding and wished to proceed. All questions answered, referred to scheduler. Further recommendations pending results of the exam.     Lavena Bullion, DO, FACG  12/20/2018, 9:36 AM   Ronnald Nian, DO

## 2018-12-20 NOTE — Patient Instructions (Addendum)
If you are age 49 or older, your body mass index should be between 23-30. Your Body mass index is 35.77 kg/m. If this is out of the aforementioned range listed, please consider follow up with your Primary Care Provider.  If you are age 79 or younger, your body mass index should be between 19-25. Your Body mass index is 35.77 kg/m. If this is out of the aformentioned range listed, please consider follow up with your Primary Care Provider.   You have been scheduled for an endoscopy. Please follow written instructions given to you at your visit today. If you use inhalers (even only as needed), please bring them with you on the day of your procedure. Your physician has requested that you go to www.startemmi.com and enter the access code given to you at your visit today. This web site gives a general overview about your procedure. However, you should still follow specific instructions given to you by our office regarding your preparation for the procedure.   You have been scheduled for an abdominal ultrasound at [facility] (1st floor [address] ) on 12/20/18 at 1:30pm. Please arrive 15 minutes prior to your appointment for registration. Make certain not to have anything to eat or drink 6 hours prior to your appointment. Should you need to reschedule your appointment, please contact radiology at [phone]. This test typically takes about 30 minutes to perform.  Increase your Prilosec to 40mg  by mouth twice daily for 4 weeks.  Please call our office at [phone] to set up your 2-3 month follow up visit.  It was a pleasure to see you today!  Vito Cirigliano, D.O.

## 2018-12-24 ENCOUNTER — Other Ambulatory Visit: Payer: Self-pay

## 2018-12-24 ENCOUNTER — Encounter: Payer: Self-pay | Admitting: Gastroenterology

## 2018-12-24 ENCOUNTER — Ambulatory Visit (AMBULATORY_SURGERY_CENTER): Payer: Federal, State, Local not specified - PPO | Admitting: Gastroenterology

## 2018-12-24 VITALS — BP 142/81 | HR 69 | Temp 97.1°F | Resp 16 | Ht 66.5 in | Wt 225.0 lb

## 2018-12-24 DIAGNOSIS — R05 Cough: Secondary | ICD-10-CM | POA: Diagnosis not present

## 2018-12-24 DIAGNOSIS — R1013 Epigastric pain: Secondary | ICD-10-CM | POA: Diagnosis not present

## 2018-12-24 DIAGNOSIS — K449 Diaphragmatic hernia without obstruction or gangrene: Secondary | ICD-10-CM

## 2018-12-24 DIAGNOSIS — K228 Other specified diseases of esophagus: Secondary | ICD-10-CM | POA: Diagnosis not present

## 2018-12-24 DIAGNOSIS — R1310 Dysphagia, unspecified: Secondary | ICD-10-CM

## 2018-12-24 DIAGNOSIS — R053 Chronic cough: Secondary | ICD-10-CM

## 2018-12-24 DIAGNOSIS — K3189 Other diseases of stomach and duodenum: Secondary | ICD-10-CM | POA: Diagnosis not present

## 2018-12-24 DIAGNOSIS — K208 Other esophagitis: Secondary | ICD-10-CM | POA: Diagnosis not present

## 2018-12-24 MED ORDER — SODIUM CHLORIDE 0.9 % IV SOLN: 500.0000 mL | Freq: Once | INTRAVENOUS | Status: DC

## 2018-12-24 NOTE — Progress Notes (Signed)
Report given to PACU, vss 

## 2018-12-24 NOTE — Patient Instructions (Signed)
Resume previous diet today. Continue present medicines. Await pathology results.  An Endoscopic Ultrasound will be scheduled for you by my office.  YOU HAD AN ENDOSCOPIC PROCEDURE TODAY AT [facility]:   Refer to the procedure report that was given to you for any specific questions about what was found during the examination.  If the procedure report does not answer your questions, please call your gastroenterologist to clarify.  If you requested that your care partner not be given the details of your procedure findings, then the procedure report has been included in a sealed envelope for you to review at your convenience later.  YOU SHOULD EXPECT: Some feelings of bloating in the abdomen. Passage of more gas than usual.  Walking can help get rid of the air that was put into your GI tract during the procedure and reduce the bloating. If you had a lower endoscopy (such as a colonoscopy or flexible sigmoidoscopy) you may notice spotting of blood in your stool or on the toilet paper. If you underwent a bowel prep for your procedure, you may not have a normal bowel movement for a few days.  Please Note:  You might notice some irritation and congestion in your nose or some drainage.  This is from the oxygen used during your procedure.  There is no need for concern and it should clear up in a day or so.  SYMPTOMS TO REPORT IMMEDIATELY:   Following upper endoscopy (EGD)  Vomiting of blood or coffee ground material  New chest pain or pain under the shoulder blades  Painful or persistently difficult swallowing  New shortness of breath  Fever of 100F or higher  Black, tarry-looking stools  For urgent or emergent issues, a gastroenterologist can be reached at any hour by calling [phone].   DIET:  We do recommend a small meal at first, but then you may proceed to your regular diet.  Drink plenty of fluids but you should avoid alcoholic beverages for 24 hours.  ACTIVITY:  You  should plan to take it easy for the rest of today and you should NOT DRIVE or use heavy machinery until tomorrow (because of the sedation medicines used during the test).    FOLLOW UP: Our staff will call the number listed on your records the next business day following your procedure to check on you and address any questions or concerns that you may have regarding the information given to you following your procedure. If we do not reach you, we will leave a message.  However, if you are feeling well and you are not experiencing any problems, there is no need to return our call.  We will assume that you have returned to your regular daily activities without incident.  If any biopsies were taken you will be contacted by phone or by letter within the next 1-3 weeks.  Please call us at [phone] if you have not heard about the biopsies in 3 weeks.    SIGNATURES/CONFIDENTIALITY: You and/or your care partner have signed paperwork which will be entered into your electronic medical record.  These signatures attest to the fact that that the information above on your After Visit Summary has been reviewed and is understood.  Full responsibility of the confidentiality of this discharge information lies with you and/or your care-partner.

## 2018-12-24 NOTE — Progress Notes (Signed)
Called to room to assist during endoscopic procedure.  Patient ID and intended procedure confirmed with present staff. Received instructions for my participation in the procedure from the performing physician.  

## 2018-12-25 ENCOUNTER — Telehealth: Payer: Self-pay

## 2018-12-25 NOTE — Op Note (Signed)
[facility] Patient Name: Melanie Roberts Procedure Date: 12/24/2018 3:23 PM MRN: [id number] Endoscopist: Gerrit Heck , MD Age: 48 Referring MD:  Date of Birth: 1971/03/17 Gender: Female Account #: [id number] Procedure:                Upper GI endoscopy Indications:              Epigastric abdominal pain, Dysphagia, Chronic                            cough, Regurgitation Medicines:                Monitored Anesthesia Care Procedure:                Pre-Anesthesia Assessment:                           - Prior to the procedure, a History and Physical                            was performed, and patient medications and                            allergies were reviewed. The patient's tolerance of                            previous anesthesia was also reviewed. The risks                            and benefits of the procedure and the sedation                            options and risks were discussed with the patient.                            All questions were answered, and informed consent                            was obtained. Prior Anticoagulants: The patient has                            taken no previous anticoagulant or antiplatelet                            agents. ASA Grade Assessment: II - A patient with                            mild systemic disease. After reviewing the risks                            and benefits, the patient was deemed in                            satisfactory condition to undergo the procedure.  After obtaining informed consent, the endoscope was                            passed under direct vision. Throughout the                            procedure, the patient's blood pressure, pulse, and                            oxygen saturations were monitored continuously. The                            Endoscope was introduced through the mouth, and                            advanced to the second part of  duodenum. The upper                            GI endoscopy was accomplished without difficulty.                            The patient tolerated the procedure well. Scope In: Scope Out: Findings:                 Esophagogastric landmarks were identified: the                            Z-line was found at 35 cm, the gastroesophageal                            junction was found at 35 cm and the site of hiatal                            narrowing was found at 38 cm from the incisors. Z                            line was regular appearing.                           A 3 cm hiatal hernia was present.                           The upper third of the esophagus, middle third of                            the esophagus and lower third of the esophagus were                            normal. The scope was withdrawn. Dilation was                            performed with a Maloney dilator with no resistance  at 51 Fr. The dilation site was examined following                            endoscope reinsertion and showed no bleeding,                            mucosal tear or perforation. This was biopsied with                            a cold forceps for evaluation of eosinophilic                            esophagitis. Estimated blood loss was minimal.                           The gastroesophageal flap valve was visualized                            endoscopically and classified as Hill Grade III                            (minimal fold, loose to endoscope, hiatal hernia                            likely).                           A single 8 mm submucosal papule (nodule) with no                            bleeding and no stigmata of recent bleeding was                            found in the gastric antrum. The overlying mucosa                            was normal appearing. The nodule ws firm and mobile                            using the closed forceps. This was not  biopsied in                            favor of further evaluation with Endoscopic                            Ultrasound.                           The gastric fundus, gastric body, incisura, gastric                            antrum and pylorus were normal. Biopsies were taken  with a cold forceps for Helicobacter pylori                            testing. Estimated blood loss was minimal.                           The duodenal bulb, first portion of the duodenum                            and second portion of the duodenum were normal. Complications:            No immediate complications. Estimated Blood Loss:     Estimated blood loss was minimal. Impression:               - Esophagogastric landmarks identified.                           - 3 cm hiatal hernia.                           - Normal upper third of esophagus, middle third of                            esophagus and lower third of esophagus. Dilated.                            Biopsied.                           - Gastroesophageal flap valve classified as Hill                            Grade III (minimal fold, loose to endoscope, hiatal                            hernia likely).                           - A single firm, mobile, submucosal nodule found in                            the gastric antrum. The overlying mucosa was                            otherwise normal appearing. Mucosal biopsies were                            not obtained in favor of further evaluation with                            Endoscopic Ultrasound.                           - Normal gastric fundus, gastric body, incisura,  antrum and pylorus. Biopsied.                           - Normal duodenal bulb, first portion of the                            duodenum and second portion of the duodenum. Recommendation:           - Patient has a contact number available for                             emergencies. The signs and symptoms of potential                            delayed complications were discussed with the                            patient. Return to normal activities tomorrow.                            Written discharge instructions were provided to the                            patient.                           - Resume previous diet today.                           - Continue present medications.                           - Await pathology results.                           - Perform an upper endoscopic ultrasound (UEUS) at                            appointment to be scheduled.                           - Resume Prilosec as prescribed.                           - Return to GI clinic in 2 months. Gerrit Heck, MD 12/24/2018 4:01:43 PM

## 2018-12-25 NOTE — Telephone Encounter (Signed)
  Follow up Call-  Call back number 12/24/2018  Post procedure Call Back phone  # [phone]  Permission to leave phone message Yes  Some recent data might be hidden     Patient questions:  Do you have a fever, pain , or abdominal swelling? No. Pain Score  0 *  Have you tolerated food without any problems? Yes.    Have you been able to return to your normal activities? Yes.    Do you have any questions about your discharge instructions: Diet   No. Medications  No. Follow up visit  No.  Do you have questions or concerns about your Care? No.  Actions: * If pain score is 4 or above: No action needed, pain <4.

## 2018-12-27 ENCOUNTER — Telehealth: Payer: Self-pay

## 2018-12-27 NOTE — Telephone Encounter (Signed)
Recall recommended on upper gi endo procedure report placed for 2 month f/u OV;

## 2018-12-27 NOTE — Telephone Encounter (Signed)
Please clarify which provider you would like for this patient to follow up with concerning the UEUS which is recommended per the upper gi endo procedure report;

## 2018-12-27 NOTE — Telephone Encounter (Signed)
-----   Message from Irving Copas., MD sent at 12/27/2018  4:30 PM EST ----- Regarding: RE: Gastric subepithelial nodule Sounds good. Melanie Roberts, please schedule an EUS in next month for follow up and further evaluation of subepithelial gastric lesion/nodule. Place in 60 minute window and will decide after EUS if this is something that we can remove and then schedule EMR vs something that we will monitor with surveillance. Thanks. GM ----- Message ----- From: Lavena Bullion, DO Sent: 12/27/2018   4:22 PM EST To: Irving Copas., MD Subject: Gastric subepithelial nodule                   Melanie Roberts, 48 year old female with incidentally noted 8 mm subepithelial nodule in the gastric antrum.  Normal overlying mucosa.  Lesion was firm and somewhat mobile with closed forceps.  I held off on biopsying in favor of EUS with possible EMR pending EUS findings.  Are you interested in this case?Thank you.[employer]

## 2018-12-27 NOTE — Telephone Encounter (Signed)
Perfect thank you.  I sent a separate message to Dr. Rush Landmark regarding upper EUS.

## 2018-12-30 ENCOUNTER — Encounter: Payer: Self-pay | Admitting: Gastroenterology

## 2018-12-30 NOTE — Telephone Encounter (Signed)
Left message on machine to call back  

## 2018-12-31 ENCOUNTER — Other Ambulatory Visit: Payer: Self-pay

## 2018-12-31 DIAGNOSIS — K3189 Other diseases of stomach and duodenum: Secondary | ICD-10-CM

## 2018-12-31 NOTE — Telephone Encounter (Signed)
EUS made for 01/22/19 at 730 am [facility] with dr Rush Landmark Left message on machine to call back

## 2019-01-01 NOTE — Telephone Encounter (Signed)
Left message on machine to call back  

## 2019-01-02 NOTE — Telephone Encounter (Signed)
EUS scheduled, pt instructed and medications reviewed.  Patient instructions mailed to home.  Patient to call with any questions or concerns.  

## 2019-01-07 ENCOUNTER — Telehealth: Payer: Self-pay

## 2019-01-07 NOTE — Telephone Encounter (Signed)
Left message on machine to call back  

## 2019-01-07 NOTE — Telephone Encounter (Signed)
The pt procedure for 4/1 needs to be moved due to the COVID virus

## 2019-01-08 NOTE — Telephone Encounter (Signed)
The pt was rescheduled and re instructed for 4/27.  The pt has been advised of the information and verbalized understanding.

## 2019-01-10 ENCOUNTER — Telehealth: Payer: Self-pay

## 2019-01-10 NOTE — Telephone Encounter (Signed)
I called pt, advised her that [facility] is closed on Monday, we will call her back as soon as we are able to get her appt rescheduled. Pt verbalized understanding.

## 2019-01-13 ENCOUNTER — Institutional Professional Consult (permissible substitution): Payer: Federal, State, Local not specified - PPO | Admitting: Neurology

## 2019-01-23 ENCOUNTER — Other Ambulatory Visit: Payer: Self-pay | Admitting: Family Medicine

## 2019-01-23 DIAGNOSIS — M5432 Sciatica, left side: Secondary | ICD-10-CM

## 2019-01-23 NOTE — Telephone Encounter (Signed)
I called pt to offer her a sooner virtual visit appt with Dr. Rexene Alberts. No answer, left a message at both home and cell numbers. If pt consents, please schedule her in Epic, and discuss the limitations of a virtual visit, the insurance and copay, and privacy. Please obtain email address.

## 2019-01-23 NOTE — Telephone Encounter (Signed)
Will refill #60 with 1 RF.

## 2019-01-23 NOTE — Telephone Encounter (Signed)
Dr.C please advise

## 2019-01-27 ENCOUNTER — Telehealth: Payer: Self-pay | Admitting: Neurology

## 2019-01-27 NOTE — Telephone Encounter (Signed)
I called the pt to talk to her about moving up her sleep consult and doing a virtual visit. Pt declined she stated she does not know much about technology. She would prefer to do a face to face visit.

## 2019-02-04 ENCOUNTER — Other Ambulatory Visit: Payer: Self-pay | Admitting: Family Medicine

## 2019-02-04 DIAGNOSIS — R600 Localized edema: Secondary | ICD-10-CM

## 2019-02-04 NOTE — Telephone Encounter (Signed)
Dr. Loletha Grayer please advise ok to refill?

## 2019-02-04 NOTE — Telephone Encounter (Signed)
Dr. Loletha Grayer please advise you sent in #30 with 1 refill. Pt is requesting 90 day.

## 2019-02-17 ENCOUNTER — Encounter (HOSPITAL_COMMUNITY): Payer: Self-pay

## 2019-02-17 ENCOUNTER — Ambulatory Visit (HOSPITAL_COMMUNITY): Admit: 2019-02-17 | Payer: Federal, State, Local not specified - PPO | Admitting: Gastroenterology

## 2019-02-17 SURGERY — UPPER ENDOSCOPIC ULTRASOUND (EUS) RADIAL
Anesthesia: Monitor Anesthesia Care

## 2019-02-26 ENCOUNTER — Encounter: Payer: Self-pay | Admitting: Family Medicine

## 2019-02-26 ENCOUNTER — Other Ambulatory Visit: Payer: Self-pay | Admitting: Family Medicine

## 2019-02-26 ENCOUNTER — Ambulatory Visit (INDEPENDENT_AMBULATORY_CARE_PROVIDER_SITE_OTHER): Payer: Federal, State, Local not specified - PPO | Admitting: Family Medicine

## 2019-02-26 VITALS — Wt 227.0 lb

## 2019-02-26 DIAGNOSIS — E039 Hypothyroidism, unspecified: Secondary | ICD-10-CM | POA: Diagnosis not present

## 2019-02-26 DIAGNOSIS — R350 Frequency of micturition: Secondary | ICD-10-CM | POA: Diagnosis not present

## 2019-02-26 DIAGNOSIS — A599 Trichomoniasis, unspecified: Secondary | ICD-10-CM

## 2019-02-26 DIAGNOSIS — R3915 Urgency of urination: Secondary | ICD-10-CM

## 2019-02-26 LAB — URINALYSIS, ROUTINE W REFLEX MICROSCOPIC
Bilirubin Urine: NEGATIVE
Ketones, ur: NEGATIVE
Nitrite: NEGATIVE
Specific Gravity, Urine: 1.025 (ref 1.000–1.030)
Urine Glucose: NEGATIVE
Urobilinogen, UA: 0.2 (ref 0.0–1.0)
pH: 6 (ref 5.0–8.0)

## 2019-02-26 MED ORDER — METRONIDAZOLE 500 MG PO TABS: ORAL_TABLET | ORAL | 0 refills | Status: DC

## 2019-02-26 MED ORDER — METRONIDAZOLE 500 MG PO TABS: 500.0000 mg | ORAL_TABLET | Freq: Three times a day (TID) | ORAL | 0 refills | Status: DC

## 2019-02-26 MED ORDER — NITROFURANTOIN MONOHYD MACRO 100 MG PO CAPS: 100.0000 mg | ORAL_CAPSULE | Freq: Two times a day (BID) | ORAL | 0 refills | Status: DC

## 2019-02-26 NOTE — Progress Notes (Signed)
Virtual Visit via Video Note  I connected with Melanie Roberts on 02/26/19 at  8:30 AM EDT by a video enabled telemedicine application and verified that I am speaking with the correct person using two identifiers. Location patient: home Location provider: home office Persons participating in the virtual visit: patient, provider  I discussed the limitations of evaluation and management by telemedicine and the availability of in person appointments. The patient expressed understanding and agreed to proceed.  Chief Complaint  Patient presents with  . Urinary Frequency    frequent urination,ammonia smell, if she sneezes urine comes and leakes without sneezing,been wearing panty liners, drinking cranberry and water/no symptoms of burning /6 days     HPI: Melanie Roberts is a 48 y.o. female complains of 6 day h/o episodes of urinary incontinence and a odor ("ammonia-like") to her urine. + increased urinary frequency and urgency. Pt states if she sneezes then urine leaks out, and at other times urine leaks without her sneezing, coughing, etc.  No dysuria, fever, chills, n/v, abdominal pain or new back pain. No vaginal discharge, itching, odor.  She has been drinking more water and cranberry juice as well.   Past Medical History:  Diagnosis Date  . Headache(784.0)    migraines  . Hemorrhoids 12/2012  . Hypothyroidism   . UTI (urinary tract infection)   . Wears partial dentures    upper and lower    Past Surgical History:  Procedure Laterality Date  . ADENOIDECTOMY  02/17/2005  . CESAREAN SECTION  07/08/2003  . EVALUATION UNDER ANESTHESIA WITH HEMORRHOIDECTOMY N/A 01/14/2013   Procedure: EXAM UNDER ANESTHESIA WITH HEMORRHOIDECTOMY POSSIBLE BANDING;  Surgeon: Madilyn Hook, DO;  Location: [facility];  Service: General;  Laterality: N/A;  . LAPAROSCOPIC TOTAL HYSTERECTOMY  10/29/2006  . MYOMECTOMY  12/18/2001   x 4  . NASAL TURBINATE REDUCTION Bilateral 02/07/2005    inferior and removed adenoids  . TONSILLECTOMY AND ADENOIDECTOMY  age 15  . TUBAL LIGATION  07/08/2003    Family History  Problem Relation Age of Onset  . Diabetes Mother   . Fibroids Mother        uterine   . Pancreatic cancer Father   . Breast cancer Maternal Grandmother   . Clotting disorder Maternal Grandmother   . Diabetes Maternal Grandmother   . Liver cancer Maternal Grandfather   . Diabetes Maternal Grandfather   . Pancreatic cancer Paternal Grandmother   . Diabetes Paternal Grandmother   . Stomach cancer Paternal Grandfather   . Diabetes Paternal Grandfather   . Colon cancer Paternal Uncle   . Esophageal cancer Neg Hx     Social History   Tobacco Use  . Smoking status: Never Smoker  . Smokeless tobacco: Never Used  Substance Use Topics  . Alcohol use: Yes    Alcohol/week: 0.0 standard drinks    Comment: occasionally  . Drug use: No     Current Outpatient Medications:  .  albuterol (PROVENTIL HFA;VENTOLIN HFA) 108 (90 Base) MCG/ACT inhaler, Inhale 2 puffs into the lungs every 4 (four) hours as needed for wheezing or shortness of breath., Disp: 1 Inhaler, Rfl: 2 .  amLODipine (NORVASC) 10 MG tablet, Take 1 tablet (10 mg total) by mouth daily., Disp: 90 tablet, Rfl: 3 .  Biotin 1000 MCG CHEW, Chew by mouth as needed., Disp: , Rfl:  .  cetirizine (ZYRTEC) 10 MG tablet, Take 1 tablet (10 mg total) by mouth daily., Disp: 90 tablet, Rfl: 3 .  EPINEPHrine (EPIPEN 2-PAK)  0.3 mg/0.3 mL IJ SOAJ injection, Inject 0.3 mLs (0.3 mg total) into the muscle as needed (allergic reaction)., Disp: 1 Device, Rfl: 1 .  fluticasone (FLONASE) 50 MCG/ACT nasal spray, Place 2 sprays into both nostrils daily., Disp: 16 g, Rfl: 6 .  furosemide (LASIX) 20 MG tablet, TAKE 1 TABLET BY MOUTH DAILY AS NEEDED, Disp: 90 tablet, Rfl: 3 .  ipratropium (ATROVENT) 0.03 % nasal spray, INSTILL 2 SPRAYS INTO BOTH NOSTRILS TWICE DAILY, Disp: 30 mL, Rfl: 0 .  levothyroxine (SYNTHROID, LEVOTHROID) 150 MCG  tablet, Take 150-225 mcg by mouth daily before breakfast. 178mcg daily except on Thursday and Sunday take 254mcg, Disp: , Rfl:  .  naproxen (NAPROSYN) 500 MG tablet, TAKE 1 TABLET BY MOUTH TWICE DAILY WITH MEALS, Disp: 60 tablet, Rfl: 1 .  omeprazole (PRILOSEC) 40 MG capsule, Take 1 capsule (40 mg total) by mouth daily., Disp: 90 capsule, Rfl: 3  Current Facility-Administered Medications:  .  0.9 %  sodium chloride infusion, 500 mL, Intravenous, Once, Cirigliano, Vito V, DO  Allergies  Allergen Reactions  . Ciprofloxacin Anaphylaxis  . Quinolones Anaphylaxis  . Bee Venom Swelling      ROS: See pertinent positives and negatives per HPI.   EXAM:  VITALS per patient if applicable: Wt 227 lb (103 kg) Comment: pt reported  BMI 36.09 kg/m    GENERAL: alert, oriented, appears well and in no acute distress  NECK: normal movements of the head and neck  LUNGS: on inspection no signs of respiratory distress, breathing rate appears normal, no obvious gross SOB, gasping or wheezing, no conversational dyspnea  CV: no obvious cyanosis  PSYCH/NEURO: pleasant and cooperative, no obvious depression or anxiety, speech and thought processing grossly intact   ASSESSMENT AND PLAN:  1. Urinary frequency 2. Urinary urgency - pt will come to office to provide urine sample for UA and cx - Urinalysis; Future - Urine Culture; Future Rx: - nitrofurantoin, macrocrystal-monohydrate, (MACROBID) 100 MG capsule; Take 1 capsule (100 mg total) by mouth 2 (two) times daily for 7 days.  Dispense: 14 capsule; Refill: 0 - cont with increased water intake - f/u if symptoms worsen or do not improve in 7-10 days - will contact pt with results when available    I discussed the assessment and treatment plan with the patient. The patient was provided an opportunity to ask questions and all were answered. The patient agreed with the plan and demonstrated an understanding of the instructions.   The patient was  advised to call back or seek an in-person evaluation if the symptoms worsen or if the condition fails to improve as anticipated.   Letta Median, DO

## 2019-02-26 NOTE — Addendum Note (Signed)
Addended by: Lynnea Ferrier on: 02/26/2019 08:54 AM   Modules accepted: Orders

## 2019-02-27 ENCOUNTER — Telehealth: Payer: Self-pay

## 2019-02-27 ENCOUNTER — Telehealth: Payer: Self-pay | Admitting: Neurology

## 2019-02-27 LAB — URINE CULTURE
MICRO NUMBER:: 450187
SPECIMEN QUALITY:: ADEQUATE

## 2019-02-27 NOTE — Telephone Encounter (Signed)
Copied from [place] [id number]. Topic: General - Inquiry >> Feb 26, 2019  2:09 PM Berneta Levins wrote: Reason for CRM:   Pt calling to get lab results.

## 2019-02-27 NOTE — Telephone Encounter (Signed)
Pt given lab results 5/6

## 2019-02-27 NOTE — Telephone Encounter (Signed)
Due to current COVID 19 pandemic, our office is severely reducing in office visits until further notice, in order to minimize the risk to our patients and healthcare providers.   Called patient to offer an in-office visit since she had previously declined a virtual visit and was rescheduled to June. Patient accepted in office visit and rescheduled for 5/14. I explained that we are taking necessary precautions to keep patients and staff safe by only allowing just a few patients to come back into the office. Patient understands that when she arrives she will need to pull her car up to the entrance and wait for direction from a staff member. She is aware that she will have her temp taken and be asked screening questions about COVID-19. She is aware that RN will call her prior to appointment.

## 2019-02-28 ENCOUNTER — Other Ambulatory Visit: Payer: Self-pay | Admitting: Family Medicine

## 2019-02-28 DIAGNOSIS — E039 Hypothyroidism, unspecified: Secondary | ICD-10-CM | POA: Diagnosis not present

## 2019-03-04 ENCOUNTER — Telehealth: Payer: Self-pay | Admitting: Neurology

## 2019-03-04 NOTE — Telephone Encounter (Signed)
Pt called to cancel her sleep consult because she is waiting to be scheduled for surgery for the hernia on her esophagus .  Pt will call back to be r/s once the surgery is done.

## 2019-03-06 ENCOUNTER — Institutional Professional Consult (permissible substitution): Payer: Federal, State, Local not specified - PPO | Admitting: Neurology

## 2019-03-24 ENCOUNTER — Institutional Professional Consult (permissible substitution): Payer: Federal, State, Local not specified - PPO | Admitting: Neurology

## 2019-04-16 DIAGNOSIS — E039 Hypothyroidism, unspecified: Secondary | ICD-10-CM | POA: Diagnosis not present

## 2019-04-24 ENCOUNTER — Telehealth: Payer: Self-pay | Admitting: Gastroenterology

## 2019-04-24 NOTE — Telephone Encounter (Signed)
Pt needs EUS for gastric nodules with Dr Rush Landmark

## 2019-04-24 NOTE — Telephone Encounter (Signed)
Patient called in wanting to speak with the nurse to get her hospital surgery scheduled.

## 2019-04-29 NOTE — Telephone Encounter (Signed)
Patient is returning your call.  

## 2019-05-01 NOTE — Telephone Encounter (Signed)
Patient called back

## 2019-05-02 ENCOUNTER — Other Ambulatory Visit: Payer: Self-pay | Admitting: Family Medicine

## 2019-05-02 DIAGNOSIS — R0982 Postnasal drip: Secondary | ICD-10-CM

## 2019-05-05 ENCOUNTER — Other Ambulatory Visit: Payer: Self-pay

## 2019-05-05 DIAGNOSIS — K3189 Other diseases of stomach and duodenum: Secondary | ICD-10-CM

## 2019-05-05 NOTE — Telephone Encounter (Signed)
EUS scheduled for 06/04/19 at [facility] 830 am Covid testing on 8/8 Pt has been instructed and will call with any questions.

## 2019-05-09 ENCOUNTER — Telehealth: Payer: Self-pay | Admitting: Gastroenterology

## 2019-05-09 NOTE — Telephone Encounter (Signed)
Patient called requesting to speak to nurse she stated that she received the paperwork and has some additional questions.

## 2019-05-09 NOTE — Telephone Encounter (Signed)
The pt wanted to have a note to remain out of work for an additional week after the procedure.  Her employer requires her to be out of work for a minimum of 2 weeks to get paid for COVID.  She was advised to discuss this with Dr Early Osmond at the time of the case. The pt has been advised of the information and verbalized understanding.

## 2019-05-28 NOTE — Telephone Encounter (Signed)
The pt needs to be rescheduled to 8/27 with Dr Ardis Hughs at [facility] 11 am.  Covid testing changed to 8/24.  Left message on machine to call back

## 2019-05-29 NOTE — Telephone Encounter (Signed)
The pt has been advised and instructed.  Instructions left at the front desk for to pick up.

## 2019-05-31 ENCOUNTER — Other Ambulatory Visit (HOSPITAL_COMMUNITY): Payer: Federal, State, Local not specified - PPO

## 2019-06-16 ENCOUNTER — Other Ambulatory Visit (HOSPITAL_COMMUNITY)
Admission: RE | Admit: 2019-06-16 | Discharge: 2019-06-16 | Disposition: A | Discharge status: Home / Self Care | Payer: Federal, State, Local not specified - PPO | Source: Ambulatory Visit | Attending: Gastroenterology | Admitting: Gastroenterology

## 2019-06-16 DIAGNOSIS — D175 Benign lipomatous neoplasm of intra-abdominal organs: Secondary | ICD-10-CM | POA: Diagnosis not present

## 2019-06-16 DIAGNOSIS — Z01812 Encounter for preprocedural laboratory examination: Secondary | ICD-10-CM | POA: Diagnosis not present

## 2019-06-16 DIAGNOSIS — Z20828 Contact with and (suspected) exposure to other viral communicable diseases: Secondary | ICD-10-CM | POA: Diagnosis not present

## 2019-06-16 LAB — SARS CORONAVIRUS 2 (TAT 6-24 HRS): SARS Coronavirus 2: NEGATIVE

## 2019-06-18 ENCOUNTER — Other Ambulatory Visit: Payer: Self-pay

## 2019-06-18 ENCOUNTER — Encounter (HOSPITAL_COMMUNITY): Payer: Self-pay | Admitting: *Deleted

## 2019-06-19 ENCOUNTER — Encounter (HOSPITAL_COMMUNITY)
Admission: RE | Disposition: A | Discharge status: Home / Self Care | Payer: Self-pay | Source: Home / Self Care | Attending: Gastroenterology

## 2019-06-19 ENCOUNTER — Ambulatory Visit (HOSPITAL_COMMUNITY): Payer: Federal, State, Local not specified - PPO | Admitting: Anesthesiology

## 2019-06-19 ENCOUNTER — Encounter (HOSPITAL_COMMUNITY): Payer: Self-pay | Admitting: *Deleted

## 2019-06-19 ENCOUNTER — Ambulatory Visit (HOSPITAL_COMMUNITY)
Admission: RE | Admit: 2019-06-19 | Discharge: 2019-06-19 | Disposition: A | Discharge status: Home / Self Care | Payer: Federal, State, Local not specified - PPO | Attending: Gastroenterology | Admitting: Gastroenterology

## 2019-06-19 DIAGNOSIS — E039 Hypothyroidism, unspecified: Secondary | ICD-10-CM | POA: Insufficient documentation

## 2019-06-19 DIAGNOSIS — I10 Essential (primary) hypertension: Secondary | ICD-10-CM | POA: Diagnosis not present

## 2019-06-19 DIAGNOSIS — K228 Other specified diseases of esophagus: Secondary | ICD-10-CM | POA: Diagnosis not present

## 2019-06-19 DIAGNOSIS — K219 Gastro-esophageal reflux disease without esophagitis: Secondary | ICD-10-CM | POA: Diagnosis not present

## 2019-06-19 DIAGNOSIS — J302 Other seasonal allergic rhinitis: Secondary | ICD-10-CM | POA: Diagnosis not present

## 2019-06-19 DIAGNOSIS — K449 Diaphragmatic hernia without obstruction or gangrene: Secondary | ICD-10-CM | POA: Diagnosis not present

## 2019-06-19 DIAGNOSIS — K3189 Other diseases of stomach and duodenum: Secondary | ICD-10-CM

## 2019-06-19 DIAGNOSIS — Z881 Allergy status to other antibiotic agents status: Secondary | ICD-10-CM | POA: Diagnosis not present

## 2019-06-19 DIAGNOSIS — D175 Benign lipomatous neoplasm of intra-abdominal organs: Secondary | ICD-10-CM | POA: Diagnosis not present

## 2019-06-19 DIAGNOSIS — K929 Disease of digestive system, unspecified: Secondary | ICD-10-CM | POA: Diagnosis not present

## 2019-06-19 DIAGNOSIS — J209 Acute bronchitis, unspecified: Secondary | ICD-10-CM | POA: Diagnosis not present

## 2019-06-19 HISTORY — DX: Essential (primary) hypertension: I10

## 2019-06-19 HISTORY — PX: ESOPHAGOGASTRODUODENOSCOPY (EGD) WITH PROPOFOL: SHX5813

## 2019-06-19 HISTORY — PX: EUS: SHX5427

## 2019-06-19 HISTORY — DX: Gastro-esophageal reflux disease without esophagitis: K21.9

## 2019-06-19 SURGERY — ESOPHAGOGASTRODUODENOSCOPY (EGD) WITH PROPOFOL
Anesthesia: Monitor Anesthesia Care

## 2019-06-19 MED ORDER — PROPOFOL 500 MG/50ML IV EMUL
INTRAVENOUS | Status: DC | PRN
  Administered 2019-06-19: 120 ug/kg/min via INTRAVENOUS

## 2019-06-19 MED ORDER — LACTATED RINGERS IV SOLN
INTRAVENOUS | Status: DC
  Administered 2019-06-19: 1000 mL via INTRAVENOUS

## 2019-06-19 MED ORDER — LIDOCAINE 2% (20 MG/ML) 5 ML SYRINGE
INTRAMUSCULAR | Status: DC | PRN
  Administered 2019-06-19: 50 mg via INTRAVENOUS

## 2019-06-19 MED ORDER — PROPOFOL 10 MG/ML IV BOLUS
INTRAVENOUS | Status: DC | PRN
  Administered 2019-06-19: 30 mg via INTRAVENOUS
  Administered 2019-06-19: 20 mg via INTRAVENOUS

## 2019-06-19 MED ORDER — SODIUM CHLORIDE 0.9 % IV SOLN: INTRAVENOUS | Status: DC

## 2019-06-19 MED ORDER — PROPOFOL 10 MG/ML IV BOLUS
INTRAVENOUS | Status: AC
  Filled 2019-06-19: qty 40

## 2019-06-19 SURGICAL SUPPLY — 15 items

## 2019-06-19 NOTE — Op Note (Signed)
[facility] Patient Name: Melanie Roberts Procedure Date: 06/19/2019 MRN: [id number] Attending MD: Milus Banister , MD Date of Birth: January 06, 1971 CSN: [id number] Age: 48 Admit Type: Inpatient Procedure:                Upper EUS Indications:              Incidental subepithelial lesion noted during EGD                            12/2018 Providers:                Milus Banister, MD, Cleda Daub, RN, Ashley Garwood Wentzell, RN, William Dalton, Technician Referring MD:             Gerrit Heck, DO Medicines:                Monitored Anesthesia Care Complications:            No immediate complications. Estimated blood loss:                            None. Estimated Blood Loss:     Estimated blood loss: none. Procedure:                Pre-Anesthesia Assessment:                           - Prior to the procedure, a History and Physical                            was performed, and patient medications and                            allergies were reviewed. The patient's tolerance of                            previous anesthesia was also reviewed. The risks                            and benefits of the procedure and the sedation                            options and risks were discussed with the patient.                            All questions were answered, and informed consent                            was obtained. Prior Anticoagulants: The patient has                            taken no previous anticoagulant or antiplatelet                            agents. ASA  Grade Assessment: II - A patient with                            mild systemic disease. After reviewing the risks                            and benefits, the patient was deemed in                            satisfactory condition to undergo the procedure.                           After obtaining informed consent, the endoscope was                            passed under  direct vision. Throughout the                            procedure, the patient's blood pressure, pulse, and                            oxygen saturations were monitored continuously. The                            [id number]) Olympus Radial EUS was                            introduced through the mouth, and advanced to the                            second part of duodenum. The upper EUS was                            accomplished without difficulty. The patient                            tolerated the procedure well. Scope In: Scope Out: Findings:      ENDOSCOPIC FINDING: :      1. Normal esophagus.      2. Small hiatal hernia.      3. Small (subCM) subepithelial bulging in the gastric antrum with normal       overlying mucosa.      4. Normal duodenum      ENDOSONOGRAPHIC FINDING: :      1. The subepithelial bulging noted above correlated with a 9.67mm by       3.97mm hyperechoic lesion involving the submucosa layer of the gastric       antrum. This is diagnostic of lipoma.      2. No perigastric adenopathy.      3. Limited views of the liver, pancreas, gallbladder, spleen were all       normal. Impression:               - The antral subepithelial lesion is a small lipoma                            (  9.2mm by 3.48mm) and it does not require any                            further testing. Moderate Sedation:      Not Applicable - Patient had care per Anesthesia. Recommendation:           - Discharge patient to home (ambulatory). Procedure Code(s):        --- Professional ---                           [phone], Esophagogastroduodenoscopy, flexible,                            transoral; with endoscopic ultrasound examination                            limited to the esophagus, stomach or duodenum, and                            adjacent structures Diagnosis Code(s):        --- Professional ---                           K22.8, Other specified diseases of esophagus CPT copyright  2019 American [employer] Association. All rights reserved. The codes documented in this report are preliminary and upon coder review may  be revised to meet current compliance requirements. Milus Banister, MD 06/19/2019 10:35:08 AM This report has been signed electronically. Number of Addenda: 0

## 2019-06-19 NOTE — Discharge Instructions (Signed)
YOU HAD AN ENDOSCOPIC PROCEDURE TODAY: Refer to the procedure report and other information in the discharge instructions given to you for any specific questions about what was found during the examination. If this information does not answer your questions, please call [place] office at [phone] to clarify.  ° °YOU SHOULD EXPECT: Some feelings of bloating in the abdomen. Passage of more gas than usual. Walking can help get rid of the air that was put into your GI tract during the procedure and reduce the bloating. If you had a lower endoscopy (such as a colonoscopy or flexible sigmoidoscopy) you may notice spotting of blood in your stool or on the toilet paper. Some abdominal soreness may be present for a day or two, also. ° °DIET: Your first meal following the procedure should be a light meal and then it is ok to progress to your normal diet. A half-sandwich or bowl of soup is an example of a good first meal. Heavy or fried foods are harder to digest and may make you feel nauseous or bloated. Drink plenty of fluids but you should avoid alcoholic beverages for 24 hours. If you had a esophageal dilation, please see attached instructions for diet.   ° °ACTIVITY: Your care partner should take you home directly after the procedure. You should plan to take it easy, moving slowly for the rest of the day. You can resume normal activity the day after the procedure however YOU SHOULD NOT DRIVE, use power tools, machinery or perform tasks that involve climbing or major physical exertion for 24 hours (because of the sedation medicines used during the test).  ° °SYMPTOMS TO REPORT IMMEDIATELY: °A gastroenterologist can be reached at any hour. Please call [phone]  for any of the following symptoms:  °Following lower endoscopy (colonoscopy, flexible sigmoidoscopy) °Excessive amounts of blood in the stool  °Significant tenderness, worsening of abdominal pains  °Swelling of the abdomen that is new, acute  °Fever of 100° or  higher  °Following upper endoscopy (EGD, EUS, ERCP, esophageal dilation) °Vomiting of blood or coffee ground material  °New, significant abdominal pain  °New, significant chest pain or pain under the shoulder blades  °Painful or persistently difficult swallowing  °New shortness of breath  °Black, tarry-looking or red, bloody stools ° °FOLLOW UP:  °If any biopsies were taken you will be contacted by phone or by letter within the next 1-3 weeks. Call [phone]  if you have not heard about the biopsies in 3 weeks.  °Please also call with any specific questions about appointments or follow up tests. ° °

## 2019-06-19 NOTE — Anesthesia Preprocedure Evaluation (Addendum)
Anesthesia Evaluation  Patient identified by MRN, date of birth, ID band Patient awake    Reviewed: Allergy & Precautions, NPO status , Patient's Chart, lab work & pertinent test results  Airway Mallampati: III  TM Distance: >3 FB Neck ROM: Full    Dental  (+) Partial Upper, Partial Lower   Pulmonary neg pulmonary ROS,    Pulmonary exam normal breath sounds clear to auscultation       Cardiovascular hypertension, Pt. on medications Normal cardiovascular exam Rhythm:Regular Rate:Normal     Neuro/Psych  Headaches, negative psych ROS   GI/Hepatic Neg liver ROS, GERD  Medicated and Controlled,  Endo/Other  Hypothyroidism   Renal/GU negative Renal ROS     Musculoskeletal negative musculoskeletal ROS (+)   Abdominal (+) + obese,   Peds  Hematology negative hematology ROS (+)   Anesthesia Other Findings gastric nodule  Reproductive/Obstetrics                           Anesthesia Physical Anesthesia Plan  ASA: III  Anesthesia Plan: MAC   Post-op Pain Management:    Induction:   PONV Risk Score and Plan: 2 and Propofol infusion and Treatment may vary due to age or medical condition  Airway Management Planned: Nasal Cannula  Additional Equipment:   Intra-op Plan:   Post-operative Plan:   Informed Consent: I have reviewed the patients History and Physical, chart, labs and discussed the procedure including the risks, benefits and alternatives for the proposed anesthesia with the patient or authorized representative who has indicated his/her understanding and acceptance.     Dental advisory given  Plan Discussed with: CRNA  Anesthesia Plan Comments:        Anesthesia Quick Evaluation

## 2019-06-19 NOTE — Anesthesia Postprocedure Evaluation (Signed)
Anesthesia Post Note  Patient: Melanie Roberts  Procedure(s) Performed: ESOPHAGOGASTRODUODENOSCOPY (EGD) WITH PROPOFOL (N/A ) UPPER ENDOSCOPIC ULTRASOUND (EUS) RADIAL (N/A )     Patient location during evaluation: Endoscopy Anesthesia Type: MAC Level of consciousness: awake and alert Pain management: pain level controlled Vital Signs Assessment: post-procedure vital signs reviewed and stable Respiratory status: spontaneous breathing, nonlabored ventilation, respiratory function stable and patient connected to nasal cannula oxygen Cardiovascular status: stable and blood pressure returned to baseline Postop Assessment: no apparent nausea or vomiting Anesthetic complications: no    Last Vitals:  Vitals:   06/19/19 1050 06/19/19 1100  BP: (!) 134/102 (!) 145/105  Pulse: 65 72  Resp: 12 17  Temp:    SpO2: 98% 100%    Last Pain:  Vitals:   06/19/19 1100  TempSrc:   PainSc: 0-No pain                 Ryan P Ellender

## 2019-06-19 NOTE — H&P (Signed)
HPI: This is a 48 yo woman  Chief complaint is incidental subCM gastric subepithelial nodule noted during EGD 11/2018 Dr. Bryan Lemma  ROS: complete GI ROS as described in HPI, all other review negative.  Constitutional:  No unintentional weight loss   Past Medical History:  Diagnosis Date  . GERD (gastroesophageal reflux disease)   . Headache(784.0)    migraines  . Hemorrhoids 12/2012  . Hypertension   . Hypothyroidism   . Seasonal allergies   . UTI (urinary tract infection)   . Wears partial dentures    upper and lower    Past Surgical History:  Procedure Laterality Date  . ADENOIDECTOMY  02/17/2005  . CESAREAN SECTION  07/08/2003  . EVALUATION UNDER ANESTHESIA WITH HEMORRHOIDECTOMY N/A 01/14/2013   Procedure: EXAM UNDER ANESTHESIA WITH HEMORRHOIDECTOMY POSSIBLE BANDING;  Surgeon: Madilyn Hook, DO;  Location: [place];  Service: General;  Laterality: N/A;  . fibroids removed    . LAPAROSCOPIC TOTAL HYSTERECTOMY  10/29/2006  . MYOMECTOMY  12/18/2001   x 4  . NASAL TURBINATE REDUCTION Bilateral 02/07/2005   inferior and removed adenoids  . TONSILLECTOMY AND ADENOIDECTOMY  age 33  . TUBAL LIGATION  07/08/2003    Current Facility-Administered Medications  Medication Dose Route Frequency Provider Last Rate Last Dose  . 0.9 %  sodium chloride infusion   Intravenous Continuous Mansouraty, Telford Nab., MD      . lactated ringers infusion   Intravenous Continuous Milus Banister, MD 10 mL/hr at 06/19/19 0952 1,000 mL at 06/19/19 [id number]    Allergies as of 05/05/2019 - Review Complete 02/26/2019  Allergen Reaction Noted  . Ciprofloxacin Anaphylaxis 05/14/2016  . Quinolones Anaphylaxis 05/15/2016  . Bee venom Swelling 01/07/2013    Family History  Problem Relation Age of Onset  . Diabetes Mother   . Fibroids Mother        uterine   . Pancreatic cancer Father   . Breast cancer Maternal Grandmother   . Clotting disorder Maternal Grandmother   . Diabetes  Maternal Grandmother   . Liver cancer Maternal Grandfather   . Diabetes Maternal Grandfather   . Pancreatic cancer Paternal Grandmother   . Diabetes Paternal Grandmother   . Stomach cancer Paternal Grandfather   . Diabetes Paternal Grandfather   . Colon cancer Paternal Uncle   . Esophageal cancer Neg Hx     Social History   Socioeconomic History  . Marital status: Divorced    Spouse name: Not on file  . Number of children: 3  . Years of education: Not on file  . Highest education level: Not on file  Occupational History  . Occupation: clerk  Social Needs  . Financial resource strain: Not on file  . Food insecurity    Worry: Not on file    Inability: Not on file  . Transportation needs    Medical: Not on file    Non-medical: Not on file  Tobacco Use  . Smoking status: Never Smoker  . Smokeless tobacco: Never Used  Substance and Sexual Activity  . Alcohol use: Not Currently    Alcohol/week: 0.0 standard drinks  . Drug use: No  . Sexual activity: Not on file  Lifestyle  . Physical activity    Days per week: Not on file    Minutes per session: Not on file  . Stress: Not on file  Relationships  . Social Herbalist on phone: Not on file    Gets together: Not on file  Attends religious service: Not on file    Active member of club or organization: Not on file    Attends meetings of clubs or organizations: Not on file    Relationship status: Not on file  . Intimate partner violence    Fear of current or ex partner: Not on file    Emotionally abused: Not on file    Physically abused: Not on file    Forced sexual activity: Not on file  Other Topics Concern  . Not on file  Social History Narrative  . Not on file     Physical Exam: BP (!) 148/107   Temp 98.7 F (37.1 C) (Oral)   Resp 17   Ht 5' 6.5" (1.689 m)   Wt 101.2 kg   SpO2 96%   BMI 35.45 kg/m  Constitutional: generally well-appearing Psychiatric: alert and oriented x3 Abdomen: soft,  nontender, nondistended, no obvious ascites, no peritoneal signs, normal bowel sounds No peripheral edema noted in lower extremities  Assessment and plan: 48 y.o. female with subepithelial gastric lesion  For upper EUS today  Please see the "Patient Instructions" section for addition details about the plan.  Owens Loffler, MD [facility] 06/19/2019, 9:59 AM

## 2019-06-19 NOTE — Transfer of Care (Signed)
Immediate Anesthesia Transfer of Care Note  Patient: Lateefah Mallery  Procedure(s) Performed: Procedure(s): ESOPHAGOGASTRODUODENOSCOPY (EGD) WITH PROPOFOL (N/A) UPPER ENDOSCOPIC ULTRASOUND (EUS) RADIAL (N/A)  Patient Location: PACU  Anesthesia Type:MAC  Level of Consciousness:  sedated, patient cooperative and responds to stimulation  Airway & Oxygen Therapy:Patient Spontanous Breathing and Patient connected to face mask oxgen  Post-op Assessment:  Report given to PACU RN and Post -op Vital signs reviewed and stable  Post vital signs:  Reviewed and stable  Last Vitals:  Vitals:   06/19/19 0949 06/19/19 1029  BP: (!) 148/107 121/84  Pulse:  78  Resp: 17 18  Temp: 37.1 C 36.4 C  SpO2: 29% 56%    Complications: No apparent anesthesia complications

## 2019-07-28 ENCOUNTER — Encounter (HOSPITAL_COMMUNITY): Payer: Self-pay

## 2019-07-28 ENCOUNTER — Other Ambulatory Visit: Payer: Self-pay

## 2019-07-28 ENCOUNTER — Emergency Department (HOSPITAL_COMMUNITY)
Admission: EM | Admit: 2019-07-28 | Discharge: 2019-07-29 | Disposition: A | Discharge status: Home / Self Care | Payer: Federal, State, Local not specified - PPO | Attending: Emergency Medicine | Admitting: Emergency Medicine

## 2019-07-28 ENCOUNTER — Emergency Department (HOSPITAL_COMMUNITY): Payer: Federal, State, Local not specified - PPO

## 2019-07-28 DIAGNOSIS — S0990XA Unspecified injury of head, initial encounter: Secondary | ICD-10-CM | POA: Diagnosis not present

## 2019-07-28 DIAGNOSIS — I10 Essential (primary) hypertension: Secondary | ICD-10-CM | POA: Insufficient documentation

## 2019-07-28 DIAGNOSIS — M542 Cervicalgia: Secondary | ICD-10-CM | POA: Diagnosis not present

## 2019-07-28 DIAGNOSIS — Y92410 Unspecified street and highway as the place of occurrence of the external cause: Secondary | ICD-10-CM | POA: Diagnosis not present

## 2019-07-28 DIAGNOSIS — E039 Hypothyroidism, unspecified: Secondary | ICD-10-CM | POA: Diagnosis not present

## 2019-07-28 DIAGNOSIS — S299XXA Unspecified injury of thorax, initial encounter: Secondary | ICD-10-CM | POA: Diagnosis not present

## 2019-07-28 DIAGNOSIS — M7918 Myalgia, other site: Secondary | ICD-10-CM | POA: Insufficient documentation

## 2019-07-28 DIAGNOSIS — S6992XA Unspecified injury of left wrist, hand and finger(s), initial encounter: Secondary | ICD-10-CM | POA: Diagnosis not present

## 2019-07-28 DIAGNOSIS — M25561 Pain in right knee: Secondary | ICD-10-CM | POA: Diagnosis not present

## 2019-07-28 DIAGNOSIS — Z79899 Other long term (current) drug therapy: Secondary | ICD-10-CM | POA: Diagnosis not present

## 2019-07-28 DIAGNOSIS — S199XXA Unspecified injury of neck, initial encounter: Secondary | ICD-10-CM | POA: Diagnosis not present

## 2019-07-28 DIAGNOSIS — M25539 Pain in unspecified wrist: Secondary | ICD-10-CM | POA: Diagnosis not present

## 2019-07-28 DIAGNOSIS — Z23 Encounter for immunization: Secondary | ICD-10-CM | POA: Diagnosis not present

## 2019-07-28 DIAGNOSIS — M25532 Pain in left wrist: Secondary | ICD-10-CM | POA: Diagnosis not present

## 2019-07-28 DIAGNOSIS — R52 Pain, unspecified: Secondary | ICD-10-CM | POA: Diagnosis not present

## 2019-07-28 DIAGNOSIS — M79642 Pain in left hand: Secondary | ICD-10-CM | POA: Diagnosis not present

## 2019-07-28 NOTE — ED Notes (Signed)
Pt transported to xray 
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
## 2019-07-28 NOTE — ED Notes (Signed)
Pt refused to get undressed at the request of tech so pt could be properly examined.
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
## 2019-07-28 NOTE — ED Notes (Signed)
Pt ambulatory in triage with no difficulty

## 2019-07-29 ENCOUNTER — Emergency Department (HOSPITAL_COMMUNITY): Payer: Federal, State, Local not specified - PPO

## 2019-07-29 ENCOUNTER — Encounter (HOSPITAL_COMMUNITY): Payer: Self-pay | Admitting: Emergency Medicine

## 2019-07-29 DIAGNOSIS — S0990XA Unspecified injury of head, initial encounter: Secondary | ICD-10-CM | POA: Diagnosis not present

## 2019-07-29 DIAGNOSIS — S299XXA Unspecified injury of thorax, initial encounter: Secondary | ICD-10-CM | POA: Diagnosis not present

## 2019-07-29 DIAGNOSIS — S199XXA Unspecified injury of neck, initial encounter: Secondary | ICD-10-CM | POA: Diagnosis not present

## 2019-07-29 MED ORDER — NAPROXEN 375 MG PO TABS: 375.0000 mg | ORAL_TABLET | Freq: Two times a day (BID) | ORAL | 0 refills | Status: DC

## 2019-07-29 MED ORDER — LIDOCAINE 5 % EX PTCH
3.0000 | MEDICATED_PATCH | CUTANEOUS | Status: DC
  Administered 2019-07-29: 3 via TRANSDERMAL
  Filled 2019-07-29: qty 3

## 2019-07-29 MED ORDER — METHOCARBAMOL 500 MG PO TABS: 500.0000 mg | ORAL_TABLET | Freq: Two times a day (BID) | ORAL | 0 refills | Status: DC

## 2019-07-29 MED ORDER — TETANUS-DIPHTH-ACELL PERTUSSIS 5-2.5-18.5 LF-MCG/0.5 IM SUSP
0.5000 mL | Freq: Once | INTRAMUSCULAR | Status: AC
  Administered 2019-07-29: 0.5 mL via INTRAMUSCULAR
  Filled 2019-07-29: qty 0.5

## 2019-07-29 MED ORDER — NAPROXEN 500 MG PO TABS
500.0000 mg | ORAL_TABLET | ORAL | Status: AC
  Administered 2019-07-29: 500 mg via ORAL
  Filled 2019-07-29: qty 1

## 2019-07-29 MED ORDER — LIDOCAINE 5 % EX PTCH: 1.0000 | MEDICATED_PATCH | CUTANEOUS | 0 refills | Status: DC

## 2019-07-29 MED ORDER — ACETAMINOPHEN 500 MG PO TABS
1000.0000 mg | ORAL_TABLET | Freq: Once | ORAL | Status: AC
  Administered 2019-07-29: 1000 mg via ORAL
  Filled 2019-07-29: qty 2
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
## 2019-07-30 ENCOUNTER — Telehealth: Payer: Self-pay

## 2019-07-30 NOTE — Telephone Encounter (Signed)
Questions for Screening COVID-19  Symptom onset: none   Travel or Contacts: none  During this illness, did/does the patient experience any of the following symptoms? Fever >100.53F []   Yes [x]   No []   Unknown Subjective fever (felt feverish) []   Yes [x]   No []   Unknown Chills []   Yes [x]   No []   Unknown Muscle aches (myalgia) []   Yes [x]   No []   Unknown Runny nose (rhinorrhea) []   Yes [x]   No []   Unknown Sore throat []   Yes [x]   No []   Unknown Cough (new onset or worsening of chronic cough) []   Yes [x]   No []   Unknown Shortness of breath (dyspnea) []   Yes [x]   No []   Unknown Nausea or vomiting []   Yes [x]   No []   Unknown Headache []   Yes [x]   No []   Unknown Abdominal pain  []   Yes [x]   No []   Unknown Diarrhea (?3 loose/looser than normal stools/24hr period) []   Yes [x]   No []   Unknown Other, specify:

## 2019-07-31 ENCOUNTER — Encounter: Payer: Self-pay | Admitting: Family Medicine

## 2019-07-31 ENCOUNTER — Ambulatory Visit: Payer: Federal, State, Local not specified - PPO | Admitting: Family Medicine

## 2019-07-31 VITALS — BP 118/90 | HR 87 | Temp 98.2°F | Ht 66.5 in | Wt 229.4 lb

## 2019-07-31 DIAGNOSIS — M542 Cervicalgia: Secondary | ICD-10-CM

## 2019-07-31 DIAGNOSIS — M549 Dorsalgia, unspecified: Secondary | ICD-10-CM

## 2019-07-31 DIAGNOSIS — M79642 Pain in left hand: Secondary | ICD-10-CM | POA: Diagnosis not present

## 2019-07-31 NOTE — Patient Instructions (Signed)
Take muscle relaxant twice per day for 2-3 days then nightly for 1 week Continue to take naproxen or other anti-inflammatory (ibuprofen) medication twice per day with food Work note Drink plenty of water
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
## 2019-08-12 ENCOUNTER — Telehealth: Payer: Self-pay | Admitting: Family Medicine

## 2019-08-12 NOTE — Telephone Encounter (Signed)
Forms filled out & placed in Dr. Lurline Del box to be signed & reviewed.

## 2019-08-12 NOTE — Telephone Encounter (Signed)
Patient came by and dropped off FMLA forms that need to be filled out. Forms will be in provider box at the front.

## 2019-08-13 ENCOUNTER — Telehealth: Payer: Self-pay

## 2019-08-13 NOTE — Telephone Encounter (Signed)
Questions for Screening COVID-19  Symptom onset: N/A  Travel or Contacts: No  During this illness, did/does the patient experience any of the following symptoms? Fever >100.4F []  Yes [x]  No []  Unknown Subjective fever (felt feverish) []  Yes [x]  No []  Unknown Chills []  Yes [x]  No []  Unknown Muscle aches (myalgia) []  Yes [x]  No []  Unknown Runny nose (rhinorrhea) []  Yes [x]  No []  Unknown Sore throat []  Yes [x]  No []  Unknown Cough (new onset or worsening of chronic cough) []  Yes [x]  No []  Unknown Shortness of breath (dyspnea) []  Yes [x]  No []  Unknown Nausea or vomiting []  Yes [x]  No []  Unknown Headache []  Yes [x]  No []  Unknown Abdominal pain  []  Yes [x]  No []  Unknown Diarrhea (?3 loose/looser than normal stools/24hr period) []  Yes [x]  No []  Unknown     

## 2019-08-13 NOTE — Telephone Encounter (Signed)
Form received. Will complete this week and contact pt once done so she can pick up

## 2019-08-14 ENCOUNTER — Other Ambulatory Visit: Payer: Self-pay

## 2019-08-14 ENCOUNTER — Telehealth: Payer: Self-pay

## 2019-08-14 ENCOUNTER — Encounter: Payer: Self-pay | Admitting: Family Medicine

## 2019-08-14 ENCOUNTER — Ambulatory Visit (INDEPENDENT_AMBULATORY_CARE_PROVIDER_SITE_OTHER): Payer: Federal, State, Local not specified - PPO

## 2019-08-14 ENCOUNTER — Ambulatory Visit: Payer: Federal, State, Local not specified - PPO | Admitting: Family Medicine

## 2019-08-14 DIAGNOSIS — I10 Essential (primary) hypertension: Secondary | ICD-10-CM | POA: Diagnosis not present

## 2019-08-14 DIAGNOSIS — M79642 Pain in left hand: Secondary | ICD-10-CM | POA: Diagnosis not present

## 2019-08-14 DIAGNOSIS — M549 Dorsalgia, unspecified: Secondary | ICD-10-CM

## 2019-08-14 DIAGNOSIS — M25511 Pain in right shoulder: Secondary | ICD-10-CM | POA: Diagnosis not present

## 2019-08-14 DIAGNOSIS — M19011 Primary osteoarthritis, right shoulder: Secondary | ICD-10-CM | POA: Diagnosis not present

## 2019-08-14 MED ORDER — METHYLPREDNISOLONE 4 MG PO TBPK: ORAL_TABLET | ORAL | 0 refills | Status: DC

## 2019-08-14 NOTE — Telephone Encounter (Signed)
Pt is coming into today to see PCP - they will give her the forms then.

## 2019-08-14 NOTE — Telephone Encounter (Signed)
Spoke with patient and provided her with the missing ICD codes.
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
## 2019-08-14 NOTE — Telephone Encounter (Signed)
Copied from [place] [id number]. Topic: General - Other >> Aug 14, 2019 12:34 PM Ivar Drape wrote: Reason for CRM:  Patient was in to see her provider today and a Disability form was filled out.  On returning the for they noticed that #2 ICD Code Diagnostic is missing.  She would like that code filled in and faxed to:  [employer]  Fax #: [phone]

## 2019-08-19 ENCOUNTER — Encounter: Payer: Self-pay | Admitting: Family Medicine

## 2019-08-21 ENCOUNTER — Ambulatory Visit: Payer: Federal, State, Local not specified - PPO

## 2019-08-25 ENCOUNTER — Other Ambulatory Visit: Payer: Self-pay

## 2019-08-25 ENCOUNTER — Encounter: Payer: Self-pay | Admitting: Physical Therapy

## 2019-08-25 ENCOUNTER — Ambulatory Visit: Payer: Federal, State, Local not specified - PPO | Attending: Family Medicine | Admitting: Physical Therapy

## 2019-08-25 DIAGNOSIS — M25611 Stiffness of right shoulder, not elsewhere classified: Secondary | ICD-10-CM

## 2019-08-25 DIAGNOSIS — G8929 Other chronic pain: Secondary | ICD-10-CM | POA: Insufficient documentation

## 2019-08-25 DIAGNOSIS — M25511 Pain in right shoulder: Secondary | ICD-10-CM | POA: Diagnosis not present

## 2019-08-25 DIAGNOSIS — M542 Cervicalgia: Secondary | ICD-10-CM | POA: Insufficient documentation
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
## 2019-08-26 ENCOUNTER — Telehealth: Payer: Self-pay | Admitting: Family Medicine

## 2019-08-26 NOTE — Telephone Encounter (Signed)
Patient would like to speak to you about her FMLA forms and things need to be updated. Please call patient so she can explain.

## 2019-08-27 ENCOUNTER — Other Ambulatory Visit: Payer: Self-pay

## 2019-08-27 ENCOUNTER — Encounter: Payer: Self-pay | Admitting: Physical Therapy

## 2019-08-27 ENCOUNTER — Ambulatory Visit: Payer: Federal, State, Local not specified - PPO | Admitting: Physical Therapy

## 2019-08-27 DIAGNOSIS — M542 Cervicalgia: Secondary | ICD-10-CM

## 2019-08-27 DIAGNOSIS — G8929 Other chronic pain: Secondary | ICD-10-CM | POA: Diagnosis not present

## 2019-08-27 DIAGNOSIS — M25511 Pain in right shoulder: Secondary | ICD-10-CM | POA: Diagnosis not present

## 2019-08-27 DIAGNOSIS — M25611 Stiffness of right shoulder, not elsewhere classified: Secondary | ICD-10-CM | POA: Diagnosis not present

## 2019-08-27 NOTE — Therapy (Signed)
[place], [place], [address] Phone: [phone]   Fax:  [phone]  Physical Therapy Treatment  Patient Details  Name: Melanie Roberts MRN: [id number] Date of Birth: [birth year] Referring Provider (PT): Dr Nolon Lennert    Encounter Date: 08/27/2019  PT End of Session - 08/27/19 1318    Visit Number  2    Number of Visits  12    Date for PT Re-Evaluation  10/06/19    Authorization Type  [employer]BS    PT Start Time  0845    PT Stop Time  0929    PT Time Calculation (min)  44 min    Activity Tolerance  Patient tolerated treatment well    Behavior During Therapy  [facility] for tasks assessed/performed       Past Medical History:  Diagnosis Date  . GERD (gastroesophageal reflux disease)   . Headache(784.0)    migraines  . Hemorrhoids 12/2012  . Hypertension   . Hypothyroidism   . Seasonal allergies   . UTI (urinary tract infection)   . Wears partial dentures    upper and lower    Past Surgical History:  Procedure Laterality Date  . ADENOIDECTOMY  02/17/2005  . CESAREAN SECTION  07/08/2003  . ESOPHAGOGASTRODUODENOSCOPY (EGD) WITH PROPOFOL N/A 06/19/2019   Procedure: ESOPHAGOGASTRODUODENOSCOPY (EGD) WITH PROPOFOL;  Surgeon: Milus Banister, MD;  [facility];  Service: Gastroenterology;  Laterality: N/A;  . EUS N/A 06/19/2019   Procedure: UPPER ENDOSCOPIC ULTRASOUND (EUS) RADIAL;  Surgeon: Milus Banister, MD;  Location: [facility];  Service: Gastroenterology;  Laterality: N/A;  . EVALUATION UNDER ANESTHESIA WITH HEMORRHOIDECTOMY N/A 01/14/2013   Procedure: EXAM UNDER ANESTHESIA WITH HEMORRHOIDECTOMY POSSIBLE BANDING;  Surgeon: Madilyn Hook, DO;  Location: [place];  Service: General;  Laterality: N/A;  . fibroids removed    . LAPAROSCOPIC TOTAL HYSTERECTOMY  10/29/2006  . MYOMECTOMY  12/18/2001   x 4  . NASAL TURBINATE REDUCTION Bilateral 02/07/2005   inferior and removed  adenoids  . TONSILLECTOMY AND ADENOIDECTOMY  age 48  . TUBAL LIGATION  07/08/2003    There were no vitals filed for this visit.  Subjective Assessment - 08/27/19 1314    Subjective  Patient reports it feels a little better. She feels like the tennis ball has helped. She has been working on her exercises.    How long can you sit comfortably?  No limit    How long can you stand comfortably?  No limit    How long can you walk comfortably?  Noi limit                       [facility] Adult PT Treatment/Exercise - 08/27/19 0001      Shoulder Exercises: Standing   Other Standing Exercises  pendulum in all directions x10       Manual Therapy   Manual therapy comments  skilled palpation of trigger points     Joint Mobilization  grade I and II inferio glide and AP glides     Soft tissue mobilization  trigger point release to posterior shoulder and neck       Neck Exercises: Stretches   Upper Trapezius Stretch  3 reps;20 seconds;Right    Levator Stretch  3 reps;20 seconds;Right       Trigger Point Dry Needling - 08/27/19 0001    Consent Given?  Yes    Education Handout Provided  Yes  Muscles Treated Head and Neck  Upper trapezius    Muscles Treated Upper Quadrant  Teres minor;Deltoid    Upper Trapezius Response  Twitch reponse elicited    Teres minor Response  Twitch response elicited           PT Education - 08/27/19 1318    Education Details  HEP and symptom mangement    Person(s) Educated  Patient    Methods  Explanation;Tactile cues;Verbal cues;Demonstration    Comprehension  Verbalized understanding;Returned demonstration;Verbal cues required;Tactile cues required       PT Short Term Goals - 08/27/19 1328      PT SHORT TERM GOAL #1   Title  Patient will increase right cervical rotation by 10 degrees    Time  3    Period  Weeks    Status  On-going    Target Date  09/15/19      PT SHORT TERM GOAL #2   Title  Patient will increase active right shoulder  flexion by 20 degrees    Time  3    Period  Weeks    Status  On-going    Target Date  09/15/19      PT SHORT TERM GOAL #3   Title  Patient will increase right shoulder flexion strength to 4/5    Time  3    Period  Weeks    Status  On-going    Target Date  09/15/19        PT Long Term Goals - 08/25/19 0944      PT LONG TERM GOAL #1   Title  Patient will reach behind her head without pain in order to brush her hair.    Time  6    Period  Weeks    Status  New    Target Date  10/06/19      PT LONG TERM GOAL #2   Title  Patient will reach overhead to a shelf without pain    Time  6    Status  New      PT LONG TERM GOAL #3   Title  Patient will return to work without increased neck and shoulder pain    Time  6    Period  Weeks    Status  New    Target Date  10/06/19            Plan - 08/27/19 1319    Clinical Impression Statement  Therapy needled the patients teres minor and posterior deltoid. She hasd a good twtich response. She also had a good twitch respose of her upper trap. Per visual inspection her passive right shoulder motion improved. Therapy reviewed stretching for her neck and pendulums to losen her shoulder. She was advised to continue with her HEP.    Personal Factors and Comorbidities  Profession    Examination-Activity Limitations  Carry;Lift;Reach Overhead    Examination-Participation Restrictions  Meal Prep;Community Activity;Shop    Stability/Clinical Decision Making  Evolving/Moderate complexity    Clinical Decision Making  Moderate    Rehab Potential  Good    PT Frequency  2x / week    PT Duration  6 weeks    PT Treatment/Interventions  ADLs/Self Care Home Management;Electrical Stimulation;Iontophoresis 4mg /ml Dexamethasone;Moist Heat;Traction;Ultrasound;DME Instruction;Functional mobility training;Therapeutic activities;Therapeutic exercise;Patient/family education;Manual techniques;Passive range of motion;Dry needling;Taping;Joint  Manipulations;Spinal Manipulations    PT Next Visit Plan  begin with joint mobiliztion and anti-inflammatory modalities of the shoulder. She has evry limited PROM; consider TPDN to upper  trpa and posteriorshoulder as well as trigger point release. Continue with scpaular/postural correction exercises. Progress to RTC strengthening when tolerated but patient reactive at this time. Consider light manual traction to the cervical spine    PT Home Exercise Plan  tennis ball trigger point release; scap retraction; chest press with wand    Consulted and Agree with Plan of Care  Patient       Patient will benefit from skilled therapeutic intervention in order to improve the following deficits and impairments:  Increased fascial restricitons, Decreased range of motion, Impaired UE functional use, Increased muscle spasms, Decreased strength, Decreased activity tolerance, Postural dysfunction  Visit Diagnosis: Chronic right shoulder pain  Stiffness of right shoulder, not elsewhere classified  Cervicalgia     Problem List Patient Active Problem List   Diagnosis Date Noted  . Gastric nodule   . Cough 12/03/2018  . Primary hypothyroidism 01/26/2017  . Hypercholesterolemia 01/26/2017  . Acute bronchitis 12/21/2016  . Allergic reaction   . Anaphylaxis 05/14/2016  . Idiopathic urticaria 09/29/2015    Carney Living 08/27/2019, 1:30 PM  [place], [place], [address] Phone: [phone]   Fax:  [phone]  Name: Melanie Roberts MRN: [id number] Date of Birth: Jun 28, 1971

## 2019-08-27 NOTE — Telephone Encounter (Signed)
Form completed and letter printed.

## 2019-08-27 NOTE — Telephone Encounter (Signed)
I called and spoke with pt. The form she dropped off is for her [employer], since she isn't currently working, they needed proof of the disability. She did go back to work for one day, but they sent her back home since she can't do much with her current restrictions. Patient also needs a note for her employer stating her current restrictions.

## 2019-08-28 ENCOUNTER — Telehealth: Payer: Self-pay | Admitting: Family Medicine

## 2019-08-28 DIAGNOSIS — Z0279 Encounter for issue of other medical certificate: Secondary | ICD-10-CM

## 2019-08-28 NOTE — Telephone Encounter (Signed)
Pt is aware that both form & letter are up front and ready to be picked up.

## 2019-08-28 NOTE — Telephone Encounter (Signed)
Disregard encounter

## 2019-09-01 ENCOUNTER — Other Ambulatory Visit: Payer: Self-pay

## 2019-09-01 ENCOUNTER — Ambulatory Visit: Payer: Federal, State, Local not specified - PPO | Admitting: Physical Therapy

## 2019-09-01 ENCOUNTER — Encounter: Payer: Self-pay | Admitting: Physical Therapy

## 2019-09-01 DIAGNOSIS — M25511 Pain in right shoulder: Secondary | ICD-10-CM | POA: Diagnosis not present

## 2019-09-01 DIAGNOSIS — M25611 Stiffness of right shoulder, not elsewhere classified: Secondary | ICD-10-CM

## 2019-09-01 DIAGNOSIS — M542 Cervicalgia: Secondary | ICD-10-CM

## 2019-09-01 DIAGNOSIS — G8929 Other chronic pain: Secondary | ICD-10-CM | POA: Diagnosis not present

## 2019-09-01 NOTE — Therapy (Signed)
[place], [place], [address] Phone: [phone]   Fax:  [phone]  Physical Therapy Treatment  Patient Details  Name: Melanie Roberts MRN: [id number] Date of Birth: [birth year] Referring Provider (PT): Dr Nolon Lennert    Encounter Date: 09/01/2019  PT End of Session - 09/01/19 0801    Visit Number  3    Number of Visits  12    Date for PT Re-Evaluation  10/06/19    Authorization Type  BCBS    PT Start Time  0800    PT Stop Time  0844    PT Time Calculation (min)  44 min    Activity Tolerance  Patient tolerated treatment well       Past Medical History:  Diagnosis Date  . GERD (gastroesophageal reflux disease)   . Headache(784.0)    migraines  . Hemorrhoids 12/2012  . Hypertension   . Hypothyroidism   . Seasonal allergies   . UTI (urinary tract infection)   . Wears partial dentures    upper and lower    Past Surgical History:  Procedure Laterality Date  . ADENOIDECTOMY  02/17/2005  . CESAREAN SECTION  07/08/2003  . ESOPHAGOGASTRODUODENOSCOPY (EGD) WITH PROPOFOL N/A 06/19/2019   Procedure: ESOPHAGOGASTRODUODENOSCOPY (EGD) WITH PROPOFOL;  Surgeon: Milus Banister, MD;  Location: [facility];  Service: Gastroenterology;  Laterality: N/A;  . EUS N/A 06/19/2019   Procedure: UPPER ENDOSCOPIC ULTRASOUND (EUS) RADIAL;  Surgeon: Milus Banister, MD;  Location: [facility];  Service: Gastroenterology;  Laterality: N/A;  . EVALUATION UNDER ANESTHESIA WITH HEMORRHOIDECTOMY N/A 01/14/2013   Procedure: EXAM UNDER ANESTHESIA WITH HEMORRHOIDECTOMY POSSIBLE BANDING;  Surgeon: Madilyn Hook, DO;  Location: [facility];  Service: General;  Laterality: N/A;  . fibroids removed    . LAPAROSCOPIC TOTAL HYSTERECTOMY  10/29/2006  . MYOMECTOMY  12/18/2001   x 4  . NASAL TURBINATE REDUCTION Bilateral 02/07/2005   inferior and removed adenoids  . TONSILLECTOMY AND ADENOIDECTOMY  age 31  . TUBAL  LIGATION  07/08/2003    There were no vitals filed for this visit.  Subjective Assessment - 09/01/19 0802    Subjective  "I feel better, the shoulder stiffened up on my the last few nights. I've had use the lidocain patches and heat"    Currently in Pain?  Yes    Pain Score  6     Pain Orientation  Right    Pain Descriptors / Indicators  Aching;Sore    Aggravating Factors   reahing, lying down         [facility] PT Assessment - 09/01/19 0001      Assessment   Medical Diagnosis  Neck and Right Shoulder Pain     Referring Provider (PT)  Dr Nolon Lennert     Onset Date/Surgical Date  07/28/19                   [facility] Adult PT Treatment/Exercise - 09/01/19 0001      Shoulder Exercises: Seated   Row  Strengthening;Both;12 reps;Theraband    Theraband Level (Shoulder Row)  Level 2 (Red)    Other Seated Exercises  scapular retraction with ER 2 x 10 with yellow band      Shoulder Exercises: ROM/Strengthening   UBE (Upper Arm Bike)  L1 x 4 min    changing directat at 2 min     Manual Therapy   Manual Therapy  Soft tissue mobilization    Manual therapy  comments  skilled palpation of trigger points     Joint Mobilization  grade I and II inferio glide and AP glides     Soft tissue mobilization  IASTM along middle deltoid      Neck Exercises: Stretches   Upper Trapezius Stretch  2 reps;30 seconds;Right    Other Neck Stretches  pec stretch 2 x 30 sec using door way with hands low to focus on upper pec       Trigger Point Dry Needling - 09/01/19 0001    Consent Given?  Yes    Education Handout Provided  Yes    Muscles Treated Upper Quadrant  Pectoralis major;Pectoralis minor;Deltoid    Pectoralis Major Response  Twitch response elicited;Palpable increased muscle length   R only   Pectoralis Minor Response  Twitch response elicited;Palpable increased muscle length   R only   Deltoid Response  Twitch response elicited;Palpable increased muscle length   middle deltoid           PT Education - 09/01/19 0836    Education Details  updated HEP for pec stretch in mulitple positions. Educated positoning that could contribute to increaesd muscle tension / tightness.    Person(s) Educated  Patient    Methods  Explanation;Verbal cues    Comprehension  Verbal cues required;Verbalized understanding       PT Short Term Goals - 08/27/19 1328      PT SHORT TERM GOAL #1   Title  Patient will increase right cervical rotation by 10 degrees    Time  3    Period  Weeks    Status  On-going    Target Date  09/15/19      PT SHORT TERM GOAL #2   Title  Patient will increase active right shoulder flexion by 20 degrees    Time  3    Period  Weeks    Status  On-going    Target Date  09/15/19      PT SHORT TERM GOAL #3   Title  Patient will increase right shoulder flexion strength to 4/5    Time  3    Period  Weeks    Status  On-going    Target Date  09/15/19        PT Long Term Goals - 08/25/19 0944      PT LONG TERM GOAL #1   Title  Patient will reach behind her head without pain in order to brush her hair.    Time  6    Period  Weeks    Status  New    Target Date  10/06/19      PT LONG TERM GOAL #2   Title  Patient will reach overhead to a shelf without pain    Time  6    Status  New      PT LONG TERM GOAL #3   Title  Patient will return to work without increased neck and shoulder pain    Time  6    Period  Weeks    Status  New    Target Date  10/06/19            Plan - 09/01/19 0831    Clinical Impression Statement  pt reports improvement since the last session but continues to report 7/10 pain/ stiffness today. continued TPDN focusing on the middle deltoid and pec major/ minor followed with shoulder mobs and IASTM techniques. worked on gradual muscle re-activation and retraining which she  did well. end of session she denied modalities and noted pain dropped to 3/10.    PT Treatment/Interventions  ADLs/Self Care Home  Management;Electrical Stimulation;Iontophoresis 4mg /ml Dexamethasone;Moist Heat;Traction;Ultrasound;DME Instruction;Functional mobility training;Therapeutic activities;Therapeutic exercise;Patient/family education;Manual techniques;Passive range of motion;Dry needling;Taping;Joint Manipulations;Spinal Manipulations    PT Next Visit Plan  begin with joint mobiliztion and anti-inflammatory modalities of the shoulder. She has evry limited PROM; consider TPDN to upper trpa and posteriorshoulder as well as trigger point release. Continue with scpaular/postural correction exercises. Progress to RTC strengthening when tolerated but patient reactive at this time. Consider light manual traction to the cervical spine    PT Home Exercise Plan  F6YMTREE tennis ball trigger point release; scap retraction; chest press with wand, pec stretch in various positions       Patient will benefit from skilled therapeutic intervention in order to improve the following deficits and impairments:  Increased fascial restricitons, Decreased range of motion, Impaired UE functional use, Increased muscle spasms, Decreased strength, Decreased activity tolerance, Postural dysfunction  Visit Diagnosis: Chronic right shoulder pain  Stiffness of right shoulder, not elsewhere classified  Cervicalgia     Problem List Patient Active Problem List   Diagnosis Date Noted  . Gastric nodule   . Cough 12/03/2018  . Primary hypothyroidism 01/26/2017  . Hypercholesterolemia 01/26/2017  . Acute bronchitis 12/21/2016  . Allergic reaction   . Anaphylaxis 05/14/2016  . Idiopathic urticaria 09/29/2015   Starr Lake PT, DPT, LAT, ATC  09/01/19  8:48 AM      [facility] [address] [place], [place], [address] Phone: [phone]   Fax:  [phone]  Name: Arriyana Danker MRN: [id number] Date of Birth: [birth year]

## 2019-09-03 ENCOUNTER — Other Ambulatory Visit: Payer: Self-pay

## 2019-09-03 ENCOUNTER — Ambulatory Visit: Payer: Federal, State, Local not specified - PPO | Admitting: Physical Therapy

## 2019-09-03 DIAGNOSIS — M25611 Stiffness of right shoulder, not elsewhere classified: Secondary | ICD-10-CM

## 2019-09-03 DIAGNOSIS — M542 Cervicalgia: Secondary | ICD-10-CM | POA: Diagnosis not present

## 2019-09-03 DIAGNOSIS — G8929 Other chronic pain: Secondary | ICD-10-CM | POA: Diagnosis not present

## 2019-09-03 DIAGNOSIS — M25511 Pain in right shoulder: Secondary | ICD-10-CM | POA: Diagnosis not present

## 2019-09-03 NOTE — Therapy (Signed)
[place], [place], [address] Phone: [phone]   Fax:  [phone]  Physical Therapy Treatment  Patient Details  Name: Melanie Roberts MRN: [id number] Date of Birth: 11-28-1970 Referring Provider (PT): Dr Nolon Lennert    Encounter Date: 09/03/2019  PT End of Session - 09/03/19 0847    Visit Number  4    Number of Visits  12    Date for PT Re-Evaluation  10/06/19    Authorization Type  BCBS    PT Start Time  [phone]    PT Stop Time  0930    PT Time Calculation (min)  43 min    Activity Tolerance  Patient tolerated treatment well    Behavior During Therapy  [facility] for tasks assessed/performed       Past Medical History:  Diagnosis Date  . GERD (gastroesophageal reflux disease)   . Headache(784.0)    migraines  . Hemorrhoids 12/2012  . Hypertension   . Hypothyroidism   . Seasonal allergies   . UTI (urinary tract infection)   . Wears partial dentures    upper and lower    Past Surgical History:  Procedure Laterality Date  . ADENOIDECTOMY  02/17/2005  . CESAREAN SECTION  07/08/2003  . ESOPHAGOGASTRODUODENOSCOPY (EGD) WITH PROPOFOL N/A 06/19/2019   Procedure: ESOPHAGOGASTRODUODENOSCOPY (EGD) WITH PROPOFOL;  Surgeon: Milus Banister, MD;  Location: [facility];  Service: Gastroenterology;  Laterality: N/A;  . EUS N/A 06/19/2019   Procedure: UPPER ENDOSCOPIC ULTRASOUND (EUS) RADIAL;  Surgeon: Milus Banister, MD;  Location: [facility];  Service: Gastroenterology;  Laterality: N/A;  . EVALUATION UNDER ANESTHESIA WITH HEMORRHOIDECTOMY N/A 01/14/2013   Procedure: EXAM UNDER ANESTHESIA WITH HEMORRHOIDECTOMY POSSIBLE BANDING;  Surgeon: Madilyn Hook, DO;  Location: [facility];  Service: General;  Laterality: N/A;  . fibroids removed    . LAPAROSCOPIC TOTAL HYSTERECTOMY  10/29/2006  . MYOMECTOMY  12/18/2001   x 4  . NASAL TURBINATE REDUCTION Bilateral 02/07/2005   inferior and removed  adenoids  . TONSILLECTOMY AND ADENOIDECTOMY  age 48  . TUBAL LIGATION  07/08/2003    There were no vitals filed for this visit.  Subjective Assessment - 09/03/19 0850    Subjective  "The DN really helped the last session, the pain is about a 3-4/10. Still some difficulty with sleeping/ posiitiong.    Currently in Pain?  Yes    Pain Score  3     Pain Location  Shoulder    Pain Orientation  Right    Pain Descriptors / Indicators  Aching;Sore    Pain Type  Chronic pain    Pain Onset  More than a month ago    Pain Frequency  Intermittent         [facility] PT Assessment - 09/03/19 0001      Assessment   Medical Diagnosis  Neck and Right Shoulder Pain     Referring Provider (PT)  Dr Nolon Lennert     Onset Date/Surgical Date  07/28/19      AROM   Right Shoulder Flexion  128 Degrees    Right Shoulder ABduction  123 Degrees                   [facility] Adult PT Treatment/Exercise - 09/03/19 0908      Shoulder Exercises: Sidelying   ABduction  Strengthening;AROM;Right;10 reps   x 2 sests combined with manual scapular upward assist     Manual Therapy  Manual Therapy  Scapular mobilization    Manual therapy comments  skilled palpation of trigger points     Soft tissue mobilization  IASTM along middle deltoid and biceps brachii    Scapular Mobilization  scapular upward assist in combination with active sidelying shoulder abduction      Neck Exercises: Stretches   Upper Trapezius Stretch  2 reps;30 seconds;Right       Trigger Point Dry Needling - 09/03/19 0001    Consent Given?  Yes    Education Handout Provided  Previously provided    Muscles Treated Upper Quadrant  Biceps    Electrical Stimulation Performed with Dry Needling  Yes    E-stim with Dry Needling Details  frequency L 18 x 8 min along middle deltoid and bicep brachii to tolerance increasing intensity as pt accomdates to it    Pectoralis Major Response  Twitch response elicited;Palpable increased muscle length     Deltoid Response  Twitch response elicited;Palpable increased muscle length   middle    Biceps Response  Twitch response elicited;Palpable increased muscle length           PT Education - 09/03/19 0912    Education Details  sleeping position in supine propping arm up on pillow to promote neutral positioning    Person(s) Educated  Patient    Methods  Explanation;Verbal cues;Handout    Comprehension  Verbalized understanding;Verbal cues required       PT Short Term Goals - 08/27/19 1328      PT SHORT TERM GOAL #1   Title  Patient will increase right cervical rotation by 10 degrees    Time  3    Period  Weeks    Status  On-going    Target Date  09/15/19      PT SHORT TERM GOAL #2   Title  Patient will increase active right shoulder flexion by 20 degrees    Time  3    Period  Weeks    Status  On-going    Target Date  09/15/19      PT SHORT TERM GOAL #3   Title  Patient will increase right shoulder flexion strength to 4/5    Time  3    Period  Weeks    Status  On-going    Target Date  09/15/19        PT Long Term Goals - 08/25/19 0944      PT LONG TERM GOAL #1   Title  Patient will reach behind her head without pain in order to brush her hair.    Time  6    Period  Weeks    Status  New    Target Date  10/06/19      PT LONG TERM GOAL #2   Title  Patient will reach overhead to a shelf without pain    Time  6    Status  New      PT LONG TERM GOAL #3   Title  Patient will return to work without increased neck and shoulder pain    Time  6    Period  Weeks    Status  New    Target Date  10/06/19            Plan - 09/03/19 0910    Clinical Impression Statement  pt reports improvement in pain at 3-4/10 today. continued TPDN along the middle deltoid and biceps brachii combined with E-stim. continued GHJ and scapular mobs with  active shoulder ROM in sidelying, worked on eccentric bicep strengthening. end of session she noted relief of pain and increased R  shoulder abduction/ flexion.    PT Next Visit Plan  continued TPDN along the bicep and deltois, add E-stim PRN, scapular mobs emphasis on upward rotation, Rotator cuff strengthening, lifting mechanics    PT Home Exercise Plan  F6YMTREE tennis ball trigger point release; scap retraction; chest press with wand, pec stretch in various positions, 3-way bicep stretch    Consulted and Agree with Plan of Care  Patient       Patient will benefit from skilled therapeutic intervention in order to improve the following deficits and impairments:  Increased fascial restricitons, Decreased range of motion, Impaired UE functional use, Increased muscle spasms, Decreased strength, Decreased activity tolerance, Postural dysfunction  Visit Diagnosis: Chronic right shoulder pain  Stiffness of right shoulder, not elsewhere classified  Cervicalgia     Problem List Patient Active Problem List   Diagnosis Date Noted  . Gastric nodule   . Cough 12/03/2018  . Primary hypothyroidism 01/26/2017  . Hypercholesterolemia 01/26/2017  . Acute bronchitis 12/21/2016  . Allergic reaction   . Anaphylaxis 05/14/2016  . Idiopathic urticaria 09/29/2015   Starr Lake PT, DPT, LAT, ATC  09/03/19  9:32 AM      [facility] [address] [place], [place], [address] Phone: [phone]   Fax:  [phone]  Name: Elve Dohrman MRN: [id number] Date of Birth: 03/13/1971

## 2019-09-08 ENCOUNTER — Ambulatory Visit: Payer: Federal, State, Local not specified - PPO | Admitting: Physical Therapy

## 2019-09-08 ENCOUNTER — Other Ambulatory Visit: Payer: Self-pay

## 2019-09-08 ENCOUNTER — Encounter: Payer: Self-pay | Admitting: Physical Therapy

## 2019-09-08 DIAGNOSIS — M25611 Stiffness of right shoulder, not elsewhere classified: Secondary | ICD-10-CM | POA: Diagnosis not present

## 2019-09-08 DIAGNOSIS — M542 Cervicalgia: Secondary | ICD-10-CM | POA: Diagnosis not present

## 2019-09-08 DIAGNOSIS — G8929 Other chronic pain: Secondary | ICD-10-CM | POA: Diagnosis not present

## 2019-09-08 DIAGNOSIS — M25511 Pain in right shoulder: Secondary | ICD-10-CM | POA: Diagnosis not present

## 2019-09-08 NOTE — Therapy (Signed)
[place], [place], [address] Phone: [phone]   Fax:  [phone]  Physical Therapy Treatment  Patient Details  Name: Melanie Roberts MRN: [id number] Date of Birth: [birth year] Referring Provider (PT): Dr Nolon Lennert    Encounter Date: 09/08/2019  PT End of Session - 09/08/19 0948    Visit Number  5    Number of Visits  12    Date for PT Re-Evaluation  10/06/19    Authorization Type  BCBS    PT Start Time  0845    PT Stop Time  0926    PT Time Calculation (min)  41 min    Activity Tolerance  Patient tolerated treatment well    Behavior During Therapy  [facility] for tasks assessed/performed       Past Medical History:  Diagnosis Date  . GERD (gastroesophageal reflux disease)   . Headache(784.0)    migraines  . Hemorrhoids 12/2012  . Hypertension   . Hypothyroidism   . Seasonal allergies   . UTI (urinary tract infection)   . Wears partial dentures    upper and lower    Past Surgical History:  Procedure Laterality Date  . ADENOIDECTOMY  02/17/2005  . CESAREAN SECTION  07/08/2003  . ESOPHAGOGASTRODUODENOSCOPY (EGD) WITH PROPOFOL N/A 06/19/2019   Procedure: ESOPHAGOGASTRODUODENOSCOPY (EGD) WITH PROPOFOL;  Surgeon: Milus Banister, MD;  [facility];  Service: Gastroenterology;  Laterality: N/A;  . EUS N/A 06/19/2019   Procedure: UPPER ENDOSCOPIC ULTRASOUND (EUS) RADIAL;  Surgeon: Milus Banister, MD;  [facility];  Service: Gastroenterology;  Laterality: N/A;  . EVALUATION UNDER ANESTHESIA WITH HEMORRHOIDECTOMY N/A 01/14/2013   Procedure: EXAM UNDER ANESTHESIA WITH HEMORRHOIDECTOMY POSSIBLE BANDING;  Surgeon: Madilyn Hook, DO;  Location: [facility];  Service: General;  Laterality: N/A;  . fibroids removed    . LAPAROSCOPIC TOTAL HYSTERECTOMY  10/29/2006  . MYOMECTOMY  12/18/2001   x 4  . NASAL TURBINATE REDUCTION Bilateral 02/07/2005   inferior and removed  adenoids  . TONSILLECTOMY AND ADENOIDECTOMY  age 16  . TUBAL LIGATION  07/08/2003    There were no vitals filed for this visit.  Subjective Assessment - 09/08/19 0849    Subjective  Patient reports the pain is getting better it is just stiff. She overall had a good weekend.    Currently in Pain?  Yes    Pain Score  3     Pain Location  Shoulder    Pain Orientation  Right    Pain Descriptors / Indicators  Aching    Pain Type  Chronic pain    Pain Onset  More than a month ago    Pain Frequency  Intermittent    Aggravating Factors   reachning,    Pain Relieving Factors  prednisone, exercises    Multiple Pain Sites  No                       [facility] Adult PT Treatment/Exercise - 09/08/19 0954      Shoulder Exercises: Standing   External Rotation  15 reps;Theraband    Theraband Level (Shoulder External Rotation)  Level 1 (Yellow)    Extension  20 reps    Theraband Level (Shoulder Extension)  Level 2 (Red)    Row  20 reps    Theraband Level (Shoulder Row)  Level 2 (Red)      Shoulder Exercises: Pulleys   Flexion Limitations  2 min  Modalities   Modalities  Iontophoresis;Ultrasound      Ultrasound   Ultrasound Location  to bicpes tendon     Ultrasound Parameters  1.5 w/.cm2 1 mhz cont    Ultrasound Goals  Pain      Iontophoresis   Type of Iontophoresis  Dexamethasone    Location  right bicpes grrove     Dose  1cc     Time  delayed onset       Manual Therapy   Manual therapy comments  skilled palpation of trigger points     Joint Mobilization  PA AND INFERIOR GLIDES grade 2-3     Soft tissue mobilization  to posterior shoulder       Neck Exercises: Stretches   Upper Trapezius Stretch  2 reps;30 seconds;Right               PT Short Term Goals - 09/08/19 0959      PT SHORT TERM GOAL #1   Title  Patient will increase right cervical rotation by 10 degrees    Baseline  improving per visual inspction    Time  3    Period  Weeks    Status   On-going    Target Date  09/15/19      PT SHORT TERM GOAL #2   Title  Patient will increase active right shoulder flexion by 20 degrees    Time  3    Period  Weeks    Status  On-going    Target Date  09/15/19      PT SHORT TERM GOAL #3   Title  Patient will increase right shoulder flexion strength to 4/5    Baseline  improving    Time  3    Period  Weeks    Status  On-going    Target Date  09/15/19        PT Long Term Goals - 08/25/19 0944      PT LONG TERM GOAL #1   Title  Patient will reach behind her head without pain in order to brush her hair.    Time  6    Period  Weeks    Status  New    Target Date  10/06/19      PT LONG TERM GOAL #2   Title  Patient will reach overhead to a shelf without pain    Time  6    Status  New      PT LONG TERM GOAL #3   Title  Patient will return to work without increased neck and shoulder pain    Time  6    Period  Weeks    Status  New    Target Date  10/06/19            Plan - 09/08/19 0948    Clinical Impression Statement  Patient is making good progress. she will saeeher MD on Thursday. She hopes to go back to work. 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 She had minor pain. All of her pain now is in her bicpes tendon. Therapy focused on modalities to reduce that inflammation. Per visual inspection her shoulder ROM has improved significantly.    Personal Factors and Comorbidities  Profession    Examination-Activity Limitations  Carry;Lift;Reach Overhead    Examination-Participation Restrictions  Meal Prep;Community Activity;Shop    Stability/Clinical Decision Making  Evolving/Moderate complexity    Clinical  Decision Making  Moderate    Rehab Potential  Good    PT Frequency  2x / week    PT Duration  6 weeks    PT Treatment/Interventions  ADLs/Self Care Home Management;Electrical Stimulation;Iontophoresis 4mg /ml Dexamethasone;Moist Heat;Traction;Ultrasound;DME  Instruction;Functional mobility training;Therapeutic activities;Therapeutic exercise;Patient/family education;Manual techniques;Passive range of motion;Dry needling;Taping;Joint Manipulations;Spinal Manipulations    PT Next Visit Plan  continue needling if trigger points are seen, continue modalities if pain remains in the bicpes groove; continue to advanace functional strengthening as tolerated. advance to kettle bell and bo lifts if she is going back to work.    PT Home Exercise Plan  F6YMTREE tennis ball trigger point release; scap retraction; chest press with wand, pec stretch in various positions, 3-way bicep stretch    Consulted and Agree with Plan of Care  Patient       Patient will benefit from skilled therapeutic intervention in order to improve the following deficits and impairments:  Increased fascial restricitons, Decreased range of motion, Impaired UE functional use, Increased muscle spasms, Decreased strength, Decreased activity tolerance, Postural dysfunction  Visit Diagnosis: Chronic right shoulder pain  Stiffness of right shoulder, not elsewhere classified  Cervicalgia     Problem List Patient Active Problem List   Diagnosis Date Noted  . Gastric nodule   . Cough 12/03/2018  . Primary hypothyroidism 01/26/2017  . Hypercholesterolemia 01/26/2017  . Acute bronchitis 12/21/2016  . Allergic reaction   . Anaphylaxis 05/14/2016  . Idiopathic urticaria 09/29/2015    Carney Living PT DPT  09/08/2019, 10:02 AM  [facility] [address] [place], [place], [address] Phone: [phone]   Fax:  [phone]  Name: Melanie Roberts MRN: [id number] Date of Birth: 08/28/1971

## 2019-09-10 ENCOUNTER — Telehealth: Payer: Self-pay

## 2019-09-10 NOTE — Telephone Encounter (Signed)
Questions for Screening COVID-19    Travel or Contacts: no  During this illness, did/does the patient experience any of the following symptoms? Fever >100.45F []   Yes [x]   No []   Unknown Subjective fever (felt feverish) []   Yes [x]   No []   Unknown Chills []   Yes [x]   No []   Unknown Muscle aches (myalgia) []   Yes [x]   No []   Unknown Runny nose (rhinorrhea) []   Yes [x]   No []   Unknown Sore throat []   Yes [x]   No []   Unknown Cough (new onset or worsening of chronic cough) []   Yes [x]   No []   Unknown Shortness of breath (dyspnea) []   Yes [x]   No []   Unknown Nausea or vomiting []   Yes [x]   No []   Unknown Headache []   Yes [x]   No []   Unknown Abdominal pain  []   Yes [x]   No []   Unknown Diarrhea (?3 loose/looser than normal stools/24hr period) []   Yes [x]   No []   Unknown

## 2019-09-11 ENCOUNTER — Ambulatory Visit: Payer: Federal, State, Local not specified - PPO | Admitting: Physical Therapy

## 2019-09-11 ENCOUNTER — Encounter: Payer: Self-pay | Admitting: Physical Therapy

## 2019-09-11 ENCOUNTER — Ambulatory Visit: Payer: Federal, State, Local not specified - PPO | Admitting: Family Medicine

## 2019-09-11 ENCOUNTER — Other Ambulatory Visit: Payer: Self-pay

## 2019-09-11 ENCOUNTER — Encounter: Payer: Self-pay | Admitting: Family Medicine

## 2019-09-11 VITALS — BP 130/82 | HR 83 | Temp 97.1°F | Ht 66.0 in | Wt 237.4 lb

## 2019-09-11 DIAGNOSIS — M25611 Stiffness of right shoulder, not elsewhere classified: Secondary | ICD-10-CM

## 2019-09-11 DIAGNOSIS — M542 Cervicalgia: Secondary | ICD-10-CM

## 2019-09-11 DIAGNOSIS — G8929 Other chronic pain: Secondary | ICD-10-CM

## 2019-09-11 DIAGNOSIS — M25511 Pain in right shoulder: Secondary | ICD-10-CM

## 2019-09-11 NOTE — Therapy (Signed)
[place], [place], [address] Phone: [phone]   Fax:  [phone]  Physical Therapy Treatment  Patient Details  Name: Melanie Roberts MRN: [id number] Date of Birth: 1971/02/15 Referring Provider (PT): Dr Nolon Lennert    Encounter Date: 09/11/2019  PT End of Session - 09/11/19 0844    Visit Number  6    Number of Visits  12    Date for PT Re-Evaluation  10/06/19    Authorization Type  BCBS    PT Start Time  0800    PT Stop Time  0845    PT Time Calculation (min)  45 min    Activity Tolerance  Patient tolerated treatment well    Behavior During Therapy  [facility] for tasks assessed/performed       Past Medical History:  Diagnosis Date  . GERD (gastroesophageal reflux disease)   . Headache(784.0)    migraines  . Hemorrhoids 12/2012  . Hypertension   . Hypothyroidism   . Seasonal allergies   . UTI (urinary tract infection)   . Wears partial dentures    upper and lower    Past Surgical History:  Procedure Laterality Date  . ADENOIDECTOMY  02/17/2005  . CESAREAN SECTION  07/08/2003  . ESOPHAGOGASTRODUODENOSCOPY (EGD) WITH PROPOFOL N/A 06/19/2019   Procedure: ESOPHAGOGASTRODUODENOSCOPY (EGD) WITH PROPOFOL;  Surgeon: Milus Banister, MD;  Location: [facility];  Service: Gastroenterology;  Laterality: N/A;  . EUS N/A 06/19/2019   Procedure: UPPER ENDOSCOPIC ULTRASOUND (EUS) RADIAL;  Surgeon: Milus Banister, MD;  Location: [facility];  Service: Gastroenterology;  Laterality: N/A;  . EVALUATION UNDER ANESTHESIA WITH HEMORRHOIDECTOMY N/A 01/14/2013   Procedure: EXAM UNDER ANESTHESIA WITH HEMORRHOIDECTOMY POSSIBLE BANDING;  Surgeon: Madilyn Hook, DO;  Location: [facility];  Service: General;  Laterality: N/A;  . fibroids removed    . LAPAROSCOPIC TOTAL HYSTERECTOMY  10/29/2006  . MYOMECTOMY  12/18/2001   x 4  . NASAL TURBINATE REDUCTION Bilateral 02/07/2005   inferior and removed  adenoids  . TONSILLECTOMY AND ADENOIDECTOMY  age 48  . TUBAL LIGATION  07/08/2003    There were no vitals filed for this visit.  Subjective Assessment - 09/11/19 0839    Subjective  Patient reports the ache in the back of her shoulder has improved. She continues to have some pain in her bicpes groove. She feels the ultrasound and ionto helped. The pspasming inher upper traps and posterior shoulder have subsided.    Currently in Pain?  Yes    Pain Score  3     Pain Location  Shoulder    Pain Orientation  Right    Pain Descriptors / Indicators  Aching    Pain Type  Chronic pain    Pain Radiating Towards  tingling when she is sleeping    Pain Onset  More than a month ago    Pain Frequency  Intermittent    Aggravating Factors   reaching    Pain Relieving Factors  prednisone    Multiple Pain Sites  No         [facility] PT Assessment - 09/11/19 0001      Assessment   Medical Diagnosis  Neck and Right Shoulder Pain     Referring Provider (PT)  Dr Nolon Lennert     Onset Date/Surgical Date  07/28/19      AROM   Right Shoulder Flexion  128 Degrees    Right Shoulder ABduction  123 Degrees  [facility] Adult PT Treatment/Exercise - 09/11/19 0001      Shoulder Exercises: Standing   External Rotation  Theraband;20 reps    Theraband Level (Shoulder External Rotation)  Level 2 (Red)    Internal Rotation  10 reps    Theraband Level (Shoulder Internal Rotation)  Level 1 (Yellow)    Extension  20 reps    Theraband Level (Shoulder Extension)  Level 2 (Red)    Row  20 reps    Theraband Level (Shoulder Row)  Level 2 (Red)    Other Standing Exercises  finger ladder 3x       Shoulder Exercises: Pulleys   Flexion Limitations  2 min       Modalities   Modalities  Iontophoresis;Ultrasound      Ultrasound   Ultrasound Location  to biceps tendon     Ultrasound Parameters  1.5 w/cm2 cont 1 mhz     Ultrasound Goals  Pain      Iontophoresis   Type of Iontophoresis   Dexamethasone    Location  right bicpes grrove     Dose  1cc     Time  delayed onset       Manual Therapy   Manual therapy comments  skilled palpation of trigger points     Joint Mobilization  PA AND INFERIOR GLIDES grade 2-3     Soft tissue mobilization  to posterior shoulder       Neck Exercises: Stretches   Upper Trapezius Stretch  2 reps;30 seconds;Right             PT Education - 09/11/19 0842    Education Details  reviewed HEP    Person(s) Educated  Patient    Methods  Explanation;Demonstration;Tactile cues    Comprehension  Verbalized understanding;Returned demonstration;Verbal cues required;Tactile cues required       PT Short Term Goals - 09/08/19 0959      PT SHORT TERM GOAL #1   Title  Patient will increase right cervical rotation by 10 degrees    Baseline  improving per visual inspction    Time  3    Period  Weeks    Status  On-going    Target Date  09/15/19      PT SHORT TERM GOAL #2   Title  Patient will increase active right shoulder flexion by 20 degrees    Time  3    Period  Weeks    Status  On-going    Target Date  09/15/19      PT SHORT TERM GOAL #3   Title  Patient will increase right shoulder flexion strength to 4/5    Baseline  improving    Time  3    Period  Weeks    Status  On-going    Target Date  09/15/19        PT Long Term Goals - 08/25/19 0944      PT LONG TERM GOAL #1   Title  Patient will reach behind her head without pain in order to brush her hair.    Time  6    Period  Weeks    Status  New    Target Date  10/06/19      PT LONG TERM GOAL #2   Title  Patient will reach overhead to a shelf without pain    Time  6    Status  New      PT LONG TERM GOAL #3   Title  Patient will return to work without increased neck and shoulder pain    Time  6    Period  Weeks    Status  New    Target Date  10/06/19            Plan - 09/11/19 0845    Clinical Impression Statement  Patient continues to make great  progress. Her passive flexion and ER are nearing end range. She has been able to tolerate more ther-ex. Therapy ahs focused on pulling exercises which she has to do at work. she had less pain today. She continues to have pain in the front of her shoulder. It appears to be more of a bicpes tendinitis. Therapy will continue towwork on modalities to recue inflammtion as well as progress her overhead and pulling strengthening.    Personal Factors and Comorbidities  Profession    Examination-Activity Limitations  Carry;Lift;Reach Overhead    Examination-Participation Restrictions  Meal Prep;Community Activity;Shop    Stability/Clinical Decision Making  Evolving/Moderate complexity    Clinical Decision Making  Moderate    Rehab Potential  Good    PT Frequency  2x / week    PT Duration  6 weeks    PT Treatment/Interventions  ADLs/Self Care Home Management;Electrical Stimulation;Iontophoresis 4mg /ml Dexamethasone;Moist Heat;Traction;Ultrasound;DME Instruction;Functional mobility training;Therapeutic activities;Therapeutic exercise;Patient/family education;Manual techniques;Passive range of motion;Dry needling;Taping;Joint Manipulations;Spinal Manipulations    PT Next Visit Plan  continue needling if trigger points are seen, continue modalities if pain remains in the bicpes groove; continue to advanace functional strengthening as tolerated. advance to kettle bell and bo lifts if she is going back to work.    PT Home Exercise Plan  F6YMTREE tennis ball trigger point release; scap retraction; chest press with wand, pec stretch in various positions, 3-way bicep stretch       Patient will benefit from skilled therapeutic intervention in order to improve the following deficits and impairments:  Increased fascial restricitons, Decreased range of motion, Impaired UE functional use, Increased muscle spasms, Decreased strength, Decreased activity tolerance, Postural dysfunction  Visit Diagnosis: Chronic right  shoulder pain  Stiffness of right shoulder, not elsewhere classified  Cervicalgia     Problem List Patient Active Problem List   Diagnosis Date Noted  . Gastric nodule   . Cough 12/03/2018  . Primary hypothyroidism 01/26/2017  . Hypercholesterolemia 01/26/2017  . Acute bronchitis 12/21/2016  . Allergic reaction   . Anaphylaxis 05/14/2016  . Idiopathic urticaria 09/29/2015    Carney Living PT DPT  09/11/2019, 10:59 AM  [facility] [address] [place], [place], [address] Phone: [phone]   Fax:  [phone]  Name: Mistina Rodin MRN: [id number] Date of Birth: 07-26-1971
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
## 2019-09-15 ENCOUNTER — Encounter: Payer: Self-pay | Admitting: Physical Therapy

## 2019-09-15 ENCOUNTER — Other Ambulatory Visit: Payer: Self-pay

## 2019-09-15 ENCOUNTER — Ambulatory Visit: Payer: Federal, State, Local not specified - PPO | Admitting: Physical Therapy

## 2019-09-15 DIAGNOSIS — M542 Cervicalgia: Secondary | ICD-10-CM | POA: Diagnosis not present

## 2019-09-15 DIAGNOSIS — M25511 Pain in right shoulder: Secondary | ICD-10-CM

## 2019-09-15 DIAGNOSIS — G8929 Other chronic pain: Secondary | ICD-10-CM | POA: Diagnosis not present

## 2019-09-15 DIAGNOSIS — M25611 Stiffness of right shoulder, not elsewhere classified: Secondary | ICD-10-CM | POA: Diagnosis not present

## 2019-09-15 NOTE — Therapy (Signed)
[place], [place], [address] Phone: [phone]   Fax:  [phone]  Physical Therapy Treatment  Patient Details  Name: Melanie Roberts MRN: [id number] Date of Birth: 04/24/1971 Referring Provider (PT): Dr Nolon Lennert    Encounter Date: 09/15/2019  PT End of Session - 09/15/19 1003    Visit Number  7    Number of Visits  12    Date for PT Re-Evaluation  10/06/19    Authorization Type  BCBS    PT Start Time  0931    PT Stop Time  1014    PT Time Calculation (min)  43 min    Activity Tolerance  Patient tolerated treatment well    Behavior During Therapy  [facility] for tasks assessed/performed       Past Medical History:  Diagnosis Date  . GERD (gastroesophageal reflux disease)   . Headache(784.0)    migraines  . Hemorrhoids 12/2012  . Hypertension   . Hypothyroidism   . Seasonal allergies   . UTI (urinary tract infection)   . Wears partial dentures    upper and lower    Past Surgical History:  Procedure Laterality Date  . ADENOIDECTOMY  02/17/2005  . CESAREAN SECTION  07/08/2003  . ESOPHAGOGASTRODUODENOSCOPY (EGD) WITH PROPOFOL N/A 06/19/2019   Procedure: ESOPHAGOGASTRODUODENOSCOPY (EGD) WITH PROPOFOL;  Surgeon: Milus Banister, MD;  [facility];  Service: Gastroenterology;  Laterality: N/A;  . EUS N/A 06/19/2019   Procedure: UPPER ENDOSCOPIC ULTRASOUND (EUS) RADIAL;  Surgeon: Milus Banister, MD;  [facility];  Service: Gastroenterology;  Laterality: N/A;  . EVALUATION UNDER ANESTHESIA WITH HEMORRHOIDECTOMY N/A 01/14/2013   Procedure: EXAM UNDER ANESTHESIA WITH HEMORRHOIDECTOMY POSSIBLE BANDING;  Surgeon: Madilyn Hook, DO;  Location: [facility];  Service: General;  Laterality: N/A;  . fibroids removed    . LAPAROSCOPIC TOTAL HYSTERECTOMY  10/29/2006  . MYOMECTOMY  12/18/2001   x 4  . NASAL TURBINATE REDUCTION Bilateral 02/07/2005   inferior and removed  adenoids  . TONSILLECTOMY AND ADENOIDECTOMY  age 20  . TUBAL LIGATION  07/08/2003    There were no vitals filed for this visit.  Subjective Assessment - 09/15/19 0935    Subjective  Patient was doing well but lats night she had asituation where she had to use her shoulder and now she is very sore.    How long can you sit comfortably?  No limit    How long can you stand comfortably?  No limit    How long can you walk comfortably?  Noi limit    Currently in Pain?  Yes    Pain Score  3     Pain Location  Shoulder    Pain Orientation  Right    Pain Descriptors / Indicators  Aching    Pain Type  Chronic pain    Pain Onset  More than a month ago    Pain Frequency  Intermittent    Aggravating Factors   rechinng    Pain Relieving Factors  prednisone    Multiple Pain Sites  No                       [facility] Adult PT Treatment/Exercise - 09/15/19 0001      Shoulder Exercises: Supine   Other Supine Exercises  wand flexion 2x10      Shoulder Exercises: Standing   External Rotation  Theraband;20 reps    Theraband Level (Shoulder  External Rotation)  Level 1 (Yellow)    Internal Rotation  10 reps    Theraband Level (Shoulder Internal Rotation)  Level 1 (Yellow)      Shoulder Exercises: Pulleys   Flexion  2 minutes    Scaption  2 minutes      Ultrasound   Ultrasound Location  to bicpes tendon     Ultrasound Parameters  1.5 w/cm2 cont     Ultrasound Goals  Pain      Iontophoresis   Type of Iontophoresis  Dexamethasone    Location  right bicpes grrove     Dose  1cc     Time  delayed onset       Manual Therapy   Manual therapy comments  skilled palpation of trigger points     Joint Mobilization  PA AND INFERIOR GLIDES grade 2-3     Soft tissue mobilization  to posterior shoulder              PT Education - 09/15/19 1333    Education Details  ice if pain increases    Person(s) Educated  Patient    Methods  Explanation;Demonstration;Tactile cues;Verbal cues     Comprehension  Verbalized understanding;Returned demonstration;Tactile cues required;Verbal cues required       PT Short Term Goals - 09/15/19 1408      PT SHORT TERM GOAL #1   Title  Patient will increase right cervical rotation by 10 degrees    Baseline  improving per visual inspction    Time  3    Period  Weeks    Status  On-going      PT SHORT TERM GOAL #2   Title  Patient will increase active right shoulder flexion by 20 degrees    Time  3    Period  Weeks    Status  On-going    Target Date  09/15/19      PT SHORT TERM GOAL #3   Title  Patient will increase right shoulder flexion strength to 4/5    Baseline  improving    Time  3    Period  Weeks    Status  On-going    Target Date  09/15/19        PT Long Term Goals - 08/25/19 0944      PT LONG TERM GOAL #1   Title  Patient will reach behind her head without pain in order to brush her hair.    Time  6    Period  Weeks    Status  New    Target Date  10/06/19      PT LONG TERM GOAL #2   Title  Patient will reach overhead to a shelf without pain    Time  6    Status  New      PT LONG TERM GOAL #3   Title  Patient will return to work without increased neck and shoulder pain    Time  6    Period  Weeks    Status  New    Target Date  10/06/19            Plan - 09/15/19 1003    Clinical Impression Statement  Patient was alitle more sore today but she was able to tolerate exercises well. Her range is full. Seh continues to have mild pain in her bicpes groove. Therapy continues to treat with modalities.    Personal Factors and Comorbidities  Profession  Examination-Activity Limitations  Carry;Lift;Reach Overhead    Examination-Participation Restrictions  Meal Prep;Community Activity;Shop    Stability/Clinical Decision Making  Evolving/Moderate complexity    Clinical Decision Making  Moderate    PT Frequency  2x / week    PT Treatment/Interventions  ADLs/Self Care Home Management;Electrical  Stimulation;Iontophoresis 4mg /ml Dexamethasone;Moist Heat;Traction;Ultrasound;DME Instruction;Functional mobility training;Therapeutic activities;Therapeutic exercise;Patient/family education;Manual techniques;Passive range of motion;Dry needling;Taping;Joint Manipulations;Spinal Manipulations    PT Next Visit Plan  continue needling if trigger points are seen, continue modalities if pain remains in the bicpes groove; continue to advanace functional strengthening as tolerated. advance to kettle bell and bo lifts if she is going back to work.    PT Home Exercise Plan  F6YMTREE tennis ball trigger point release; scap retraction; chest press with wand, pec stretch in various positions, 3-way bicep stretch    Consulted and Agree with Plan of Care  Patient       Patient will benefit from skilled therapeutic intervention in order to improve the following deficits and impairments:  Increased fascial restricitons, Decreased range of motion, Impaired UE functional use, Increased muscle spasms, Decreased strength, Decreased activity tolerance, Postural dysfunction  Visit Diagnosis: Chronic right shoulder pain  Stiffness of right shoulder, not elsewhere classified  Cervicalgia     Problem List Patient Active Problem List   Diagnosis Date Noted  . Gastric nodule   . Cough 12/03/2018  . Primary hypothyroidism 01/26/2017  . Hypercholesterolemia 01/26/2017  . Acute bronchitis 12/21/2016  . Allergic reaction   . Anaphylaxis 05/14/2016  . Idiopathic urticaria 09/29/2015    Carney Living PT DPT  09/15/2019, 3:15 PM  [facility] [address] [place], [place], [address] Phone: [phone]   Fax:  [phone]  Name: Melanie Roberts MRN: [id number] Date of Birth: [birth year]

## 2019-09-17 ENCOUNTER — Ambulatory Visit: Payer: Federal, State, Local not specified - PPO | Admitting: Physical Therapy

## 2019-09-17 ENCOUNTER — Other Ambulatory Visit: Payer: Self-pay

## 2019-09-17 ENCOUNTER — Encounter: Payer: Self-pay | Admitting: Physical Therapy

## 2019-09-17 DIAGNOSIS — G8929 Other chronic pain: Secondary | ICD-10-CM

## 2019-09-17 DIAGNOSIS — M25611 Stiffness of right shoulder, not elsewhere classified: Secondary | ICD-10-CM

## 2019-09-17 DIAGNOSIS — M542 Cervicalgia: Secondary | ICD-10-CM | POA: Diagnosis not present

## 2019-09-17 DIAGNOSIS — M25511 Pain in right shoulder: Secondary | ICD-10-CM | POA: Diagnosis not present

## 2019-09-17 NOTE — Therapy (Signed)
[place], [place], [address] Phone: [phone]   Fax:  [phone]  Physical Therapy Treatment  Patient Details  Name: Melanie Roberts MRN: [id number] Date of Birth: 1970/11/22 Referring Provider (PT): Dr Nolon Lennert    Encounter Date: 09/17/2019  PT End of Session - 09/17/19 1337    Visit Number  8    Number of Visits  12    Date for PT Re-Evaluation  10/06/19    Authorization Type  BCBS    PT Start Time  0930    PT Stop Time  1011    PT Time Calculation (min)  41 min    Activity Tolerance  Patient tolerated treatment well    Behavior During Therapy  [facility] for tasks assessed/performed       Past Medical History:  Diagnosis Date  . GERD (gastroesophageal reflux disease)   . Headache(784.0)    migraines  . Hemorrhoids 12/2012  . Hypertension   . Hypothyroidism   . Seasonal allergies   . UTI (urinary tract infection)   . Wears partial dentures    upper and lower    Past Surgical History:  Procedure Laterality Date  . ADENOIDECTOMY  02/17/2005  . CESAREAN SECTION  07/08/2003  . ESOPHAGOGASTRODUODENOSCOPY (EGD) WITH PROPOFOL N/A 06/19/2019   Procedure: ESOPHAGOGASTRODUODENOSCOPY (EGD) WITH PROPOFOL;  Surgeon: Milus Banister, MD;  Location: [facility];  Service: Gastroenterology;  Laterality: N/A;  . EUS N/A 06/19/2019   Procedure: UPPER ENDOSCOPIC ULTRASOUND (EUS) RADIAL;  Surgeon: Milus Banister, MD;  Location: [facility];  Service: Gastroenterology;  Laterality: N/A;  . EVALUATION UNDER ANESTHESIA WITH HEMORRHOIDECTOMY N/A 01/14/2013   Procedure: EXAM UNDER ANESTHESIA WITH HEMORRHOIDECTOMY POSSIBLE BANDING;  Surgeon: Madilyn Hook, DO;  Location: [place];  Service: General;  Laterality: N/A;  . fibroids removed    . LAPAROSCOPIC TOTAL HYSTERECTOMY  10/29/2006  . MYOMECTOMY  12/18/2001   x 4  . NASAL TURBINATE REDUCTION Bilateral 02/07/2005   inferior and removed  adenoids  . TONSILLECTOMY AND ADENOIDECTOMY  age 48  . TUBAL LIGATION  07/08/2003    There were no vitals filed for this visit.  Subjective Assessment - 09/17/19 0935    Subjective  Patient reporets she is still having nagging pain in the front of her shoulder. It was worse yesterday. She had to use her ice. It is better today.    How long can you sit comfortably?  No limit    How long can you stand comfortably?  No limit    How long can you walk comfortably?  No limit    Currently in Pain?  Yes    Pain Score  3     Pain Orientation  Right    Pain Descriptors / Indicators  Aching    Pain Type  Chronic pain    Pain Onset  More than a month ago    Pain Frequency  Intermittent    Aggravating Factors   reaching    Pain Relieving Factors  rest and ice                       [facility] Adult PT Treatment/Exercise - 09/17/19 0001      Shoulder Exercises: Supine   Other Supine Exercises  wand flexion 2x10 cuing for pain free range     Other Supine Exercises  wand er 2x10       Shoulder Exercises: Standing   Extension  20 reps    Theraband Level (Shoulder Extension)  Level 1 (Yellow)    Row  20 reps    Theraband Level (Shoulder Row)  Level 1 (Yellow)    Other Standing Exercises  UE ranger in pain free range 2x10       Shoulder Exercises: Pulleys   Flexion  2 minutes    Scaption  2 minutes      Manual Therapy   Manual Therapy  Passive ROM    Manual therapy comments  skilled palpation of trigger points; TFM to biceps tendon;     Joint Mobilization  PA AND INFERIOR GLIDES grade 2-3     Soft tissue mobilization  to posterior shoulder     Passive ROM  i             PT Education - 09/17/19 1335    Education Details  reviewed symptom mnagement    Person(s) Educated  Patient    Methods  Explanation;Tactile cues;Demonstration;Verbal cues    Comprehension  Verbalized understanding;Returned demonstration;Verbal cues required;Tactile cues required       PT Short Term  Goals - 09/15/19 1408      PT SHORT TERM GOAL #1   Title  Patient will increase right cervical rotation by 10 degrees    Baseline  improving per visual inspction    Time  3    Period  Weeks    Status  On-going      PT SHORT TERM GOAL #2   Title  Patient will increase active right shoulder flexion by 20 degrees    Time  3    Period  Weeks    Status  On-going    Target Date  09/15/19      PT SHORT TERM GOAL #3   Title  Patient will increase right shoulder flexion strength to 4/5    Baseline  improving    Time  3    Period  Weeks    Status  On-going    Target Date  09/15/19        PT Long Term Goals - 08/25/19 0944      PT LONG TERM GOAL #1   Title  Patient will reach behind her head without pain in order to brush her hair.    Time  6    Period  Weeks    Status  New    Target Date  10/06/19      PT LONG TERM GOAL #2   Title  Patient will reach overhead to a shelf without pain    Time  6    Status  New      PT LONG TERM GOAL #3   Title  Patient will return to work without increased neck and shoulder pain    Time  6    Period  Weeks    Status  New    Target Date  10/06/19            Plan - 09/17/19 1339    Clinical Impression Statement  Therapy focues on manual therapy. She had a low tolerance to ther-ex today.  She reportincreased pain just with cane exercises. She was advised at home not to cause herself pain.    Personal Factors and Comorbidities  Profession    Examination-Activity Limitations  Carry;Lift;Reach Overhead    Examination-Participation Restrictions  Meal Prep;Community Activity;Shop    Stability/Clinical Decision Making  Evolving/Moderate complexity    Clinical Decision Making  Moderate  Rehab Potential  Good    PT Frequency  2x / week    PT Duration  6 weeks    PT Treatment/Interventions  ADLs/Self Care Home Management;Electrical Stimulation;Iontophoresis 4mg /ml Dexamethasone;Moist Heat;Traction;Ultrasound;DME Instruction;Functional  mobility training;Therapeutic activities;Therapeutic exercise;Patient/family education;Manual techniques;Passive range of motion;Dry needling;Taping;Joint Manipulations;Spinal Manipulations    PT Next Visit Plan  continue needling if trigger points are seen, continue modalities if pain remains in the bicpes groove; continue to advanace functional strengthening as tolerated. advance to kettle bell and bo lifts if she is going back to work.    PT Home Exercise Plan  F6YMTREE tennis ball trigger point release; scap retraction; chest press with wand, pec stretch in various positions, 3-way bicep stretch    Consulted and Agree with Plan of Care  Patient       Patient will benefit from skilled therapeutic intervention in order to improve the following deficits and impairments:  Increased fascial restricitons, Decreased range of motion, Impaired UE functional use, Increased muscle spasms, Decreased strength, Decreased activity tolerance, Postural dysfunction  Visit Diagnosis: Chronic right shoulder pain  Stiffness of right shoulder, not elsewhere classified  Cervicalgia     Problem List Patient Active Problem List   Diagnosis Date Noted  . Gastric nodule   . Cough 12/03/2018  . Primary hypothyroidism 01/26/2017  . Hypercholesterolemia 01/26/2017  . Acute bronchitis 12/21/2016  . Allergic reaction   . Anaphylaxis 05/14/2016  . Idiopathic urticaria 09/29/2015    Carney Living PT DPT  09/17/2019, 1:50 PM  [place], [place], [address] Phone: [phone]   Fax:  [phone]  Name: Melanie Roberts MRN: [id number] Date of Birth: [birth year]

## 2019-09-25 ENCOUNTER — Other Ambulatory Visit: Payer: Self-pay | Admitting: Family Medicine

## 2019-09-25 DIAGNOSIS — I10 Essential (primary) hypertension: Secondary | ICD-10-CM

## 2019-09-26 ENCOUNTER — Encounter: Payer: Self-pay | Admitting: Physical Therapy

## 2019-09-26 ENCOUNTER — Ambulatory Visit: Payer: Federal, State, Local not specified - PPO | Attending: Family Medicine | Admitting: Physical Therapy

## 2019-09-26 ENCOUNTER — Other Ambulatory Visit: Payer: Self-pay

## 2019-09-26 DIAGNOSIS — M25511 Pain in right shoulder: Secondary | ICD-10-CM | POA: Diagnosis not present

## 2019-09-26 DIAGNOSIS — M542 Cervicalgia: Secondary | ICD-10-CM | POA: Diagnosis not present

## 2019-09-26 DIAGNOSIS — M25611 Stiffness of right shoulder, not elsewhere classified: Secondary | ICD-10-CM | POA: Diagnosis not present

## 2019-09-26 DIAGNOSIS — G8929 Other chronic pain: Secondary | ICD-10-CM | POA: Diagnosis not present

## 2019-09-26 NOTE — Therapy (Signed)
[place], [place], [address] Phone: [phone]   Fax:  [phone]  Physical Therapy Treatment  Patient Details  Name: Melanie Roberts MRN: [id number] Date of Birth: [birth year] Referring Provider (PT): Dr Nolon Lennert    Encounter Date: 09/26/2019  PT End of Session - 09/26/19 1012    Visit Number  9    Number of Visits  12    Date for PT Re-Evaluation  10/06/19    Authorization Type  BCBS    PT Start Time  0933    PT Stop Time  1015    PT Time Calculation (min)  42 min    Activity Tolerance  Patient tolerated treatment well    Behavior During Therapy  [facility] for tasks assessed/performed       Past Medical History:  Diagnosis Date  . GERD (gastroesophageal reflux disease)   . Headache(784.0)    migraines  . Hemorrhoids 12/2012  . Hypertension   . Hypothyroidism   . Seasonal allergies   . UTI (urinary tract infection)   . Wears partial dentures    upper and lower    Past Surgical History:  Procedure Laterality Date  . ADENOIDECTOMY  02/17/2005  . CESAREAN SECTION  07/08/2003  . ESOPHAGOGASTRODUODENOSCOPY (EGD) WITH PROPOFOL N/A 06/19/2019   Procedure: ESOPHAGOGASTRODUODENOSCOPY (EGD) WITH PROPOFOL;  Surgeon: Milus Banister, MD;  [facility];  Service: Gastroenterology;  Laterality: N/A;  . EUS N/A 06/19/2019   Procedure: UPPER ENDOSCOPIC ULTRASOUND (EUS) RADIAL;  Surgeon: Milus Banister, MD;  [facility];  Service: Gastroenterology;  Laterality: N/A;  . EVALUATION UNDER ANESTHESIA WITH HEMORRHOIDECTOMY N/A 01/14/2013   Procedure: EXAM UNDER ANESTHESIA WITH HEMORRHOIDECTOMY POSSIBLE BANDING;  Surgeon: Madilyn Hook, DO;  Location: [place];  Service: General;  Laterality: N/A;  . fibroids removed    . LAPAROSCOPIC TOTAL HYSTERECTOMY  10/29/2006  . MYOMECTOMY  12/18/2001   x 4  . NASAL TURBINATE REDUCTION Bilateral 02/07/2005   inferior and removed  adenoids  . TONSILLECTOMY AND ADENOIDECTOMY  age 48  . TUBAL LIGATION  07/08/2003    There were no vitals filed for this visit.  Subjective Assessment - 09/26/19 1009    Subjective  Patient is back to work. she has been stiff and has some sorenessinto her deltoid. The area in the front of her shoulder is better. She is on light suty at work but she has to use her arm still.    How long can you sit comfortably?  No limit    How long can you stand comfortably?  No limit    How long can you walk comfortably?  No limit    Currently in Pain?  No/denies                       [facility] Adult PT Treatment/Exercise - 09/26/19 0001      Shoulder Exercises: Supine   Flexion  20 reps    Flexion Limitations  2x10     Other Supine Exercises  wand flexion 2x10 cuing for pain free range     Other Supine Exercises  wand er 2x10       Shoulder Exercises: Standing   Internal Rotation  10 reps    Theraband Level (Shoulder Internal Rotation)  Level 2 (Red)    Extension  20 reps    Theraband Level (Shoulder Extension)  Level 2 (Red)    Row  20 reps  Theraband Level (Shoulder Row)  Level 2 (Red)      Manual Therapy   Manual Therapy  Passive ROM    Manual therapy comments  skilled palpation of trigger points; TFM to biceps tendon;     Joint Mobilization  PA AND INFERIOR GLIDES grade 2-3     Soft tissue mobilization  to posterior shoulder     Passive ROM  i       Trigger Point Dry Needling - 09/26/19 0001    Consent Given?  Yes    Education Handout Provided  Previously provided    Muscles Treated Upper Quadrant  Biceps    Electrical Stimulation Performed with Dry Needling  Yes    E-stim with Dry Needling Details  frequency L 18 x 8 min along middle deltoid and bicep brachii to tolerance increasing intensity as pt accomdates to it    Deltoid Response  Twitch response elicited;Palpable increased muscle length   middle           PT Education - 09/26/19 1012    Education  Details  HEP and symptom mnagement    Person(s) Educated  Patient    Methods  Explanation;Demonstration;Tactile cues;Verbal cues    Comprehension  Verbalized understanding;Returned demonstration;Verbal cues required;Tactile cues required       PT Short Term Goals - 09/15/19 1408      PT SHORT TERM GOAL #1   Title  Patient will increase right cervical rotation by 10 degrees    Baseline  improving per visual inspction    Time  3    Period  Weeks    Status  On-going      PT SHORT TERM GOAL #2   Title  Patient will increase active right shoulder flexion by 20 degrees    Time  3    Period  Weeks    Status  On-going    Target Date  09/15/19      PT SHORT TERM GOAL #3   Title  Patient will increase right shoulder flexion strength to 4/5    Baseline  improving    Time  3    Period  Weeks    Status  On-going    Target Date  09/15/19        PT Long Term Goals - 08/25/19 0944      PT LONG TERM GOAL #1   Title  Patient will reach behind her head without pain in order to brush her hair.    Time  6    Period  Weeks    Status  New    Target Date  10/06/19      PT LONG TERM GOAL #2   Title  Patient will reach overhead to a shelf without pain    Time  6    Status  New      PT LONG TERM GOAL #3   Title  Patient will return to work without increased neck and shoulder pain    Time  6    Period  Weeks    Status  New    Target Date  10/06/19            Plan - 09/26/19 1013    Clinical Impression Statement  The patient had a great twitch response in her deltoid. Despite increased pain with work her range is still full. She was able to tolerate ther-ex well today. She was advised to continue with self soft tissue mobilization and strengthening at home as  well as her exercises.    Personal Factors and Comorbidities  Profession    Examination-Activity Limitations  Carry;Lift;Reach Overhead    Examination-Participation Restrictions  Meal Prep;Community Activity;Shop     Stability/Clinical Decision Making  Evolving/Moderate complexity    Rehab Potential  Good    PT Frequency  2x / week    PT Duration  6 weeks    PT Treatment/Interventions  ADLs/Self Care Home Management;Electrical Stimulation;Iontophoresis 4mg /ml Dexamethasone;Moist Heat;Traction;Ultrasound;DME Instruction;Functional mobility training;Therapeutic activities;Therapeutic exercise;Patient/family education;Manual techniques;Passive range of motion;Dry needling;Taping;Joint Manipulations;Spinal Manipulations    PT Next Visit Plan  continue needling if trigger points are seen, continue modalities if pain remains in the bicpes groove; continue to advanace functional strengthening as tolerated. advance to kettle bell and bo lifts if she is going back to work.    PT Home Exercise Plan  F6YMTREE tennis ball trigger point release; scap retraction; chest press with wand, pec stretch in various positions, 3-way bicep stretch    Consulted and Agree with Plan of Care  Patient       Patient will benefit from skilled therapeutic intervention in order to improve the following deficits and impairments:  Increased fascial restricitons, Decreased range of motion, Impaired UE functional use, Increased muscle spasms, Decreased strength, Decreased activity tolerance, Postural dysfunction  Visit Diagnosis: Chronic right shoulder pain  Stiffness of right shoulder, not elsewhere classified  Cervicalgia     Problem List Patient Active Problem List   Diagnosis Date Noted  . Gastric nodule   . Cough 12/03/2018  . Primary hypothyroidism 01/26/2017  . Hypercholesterolemia 01/26/2017  . Acute bronchitis 12/21/2016  . Allergic reaction   . Anaphylaxis 05/14/2016  . Idiopathic urticaria 09/29/2015    Carney Living PT DPT  09/26/2019, 10:41 AM  [facility] [address] [place], [place], [address] Phone: [phone]   Fax:  [phone]  Name: Melanie Roberts MRN: [id number] Date of Birth: October 29, 1970

## 2019-09-29 ENCOUNTER — Encounter: Payer: Self-pay | Admitting: Physical Therapy

## 2019-09-29 ENCOUNTER — Ambulatory Visit: Payer: Federal, State, Local not specified - PPO | Admitting: Physical Therapy

## 2019-09-29 ENCOUNTER — Other Ambulatory Visit: Payer: Self-pay

## 2019-09-29 DIAGNOSIS — M25611 Stiffness of right shoulder, not elsewhere classified: Secondary | ICD-10-CM | POA: Diagnosis not present

## 2019-09-29 DIAGNOSIS — G8929 Other chronic pain: Secondary | ICD-10-CM | POA: Diagnosis not present

## 2019-09-29 DIAGNOSIS — M25511 Pain in right shoulder: Secondary | ICD-10-CM

## 2019-09-29 DIAGNOSIS — M542 Cervicalgia: Secondary | ICD-10-CM | POA: Diagnosis not present

## 2019-09-29 NOTE — Therapy (Signed)
[place], [place], [address] Phone: [phone]   Fax:  [phone]  Physical Therapy Treatment  Patient Details  Name: Melanie Roberts MRN: [id number] Date of Birth: March 23, 1971 Referring Provider (PT): Dr Nolon Lennert    Encounter Date: 09/29/2019  PT End of Session - 09/29/19 1035    Visit Number  10    Number of Visits  12    Date for PT Re-Evaluation  10/06/19    Authorization Type  BCBS    PT Start Time  0845    PT Stop Time  0925    PT Time Calculation (min)  40 min    Activity Tolerance  Patient tolerated treatment well    Behavior During Therapy  [facility] for tasks assessed/performed       Past Medical History:  Diagnosis Date  . GERD (gastroesophageal reflux disease)   . Headache(784.0)    migraines  . Hemorrhoids 12/2012  . Hypertension   . Hypothyroidism   . Seasonal allergies   . UTI (urinary tract infection)   . Wears partial dentures    upper and lower    Past Surgical History:  Procedure Laterality Date  . ADENOIDECTOMY  02/17/2005  . CESAREAN SECTION  07/08/2003  . ESOPHAGOGASTRODUODENOSCOPY (EGD) WITH PROPOFOL N/A 06/19/2019   Procedure: ESOPHAGOGASTRODUODENOSCOPY (EGD) WITH PROPOFOL;  Surgeon: Milus Banister, MD;  [facility];  Service: Gastroenterology;  Laterality: N/A;  . EUS N/A 06/19/2019   Procedure: UPPER ENDOSCOPIC ULTRASOUND (EUS) RADIAL;  Surgeon: Milus Banister, MD;  [facility];  Service: Gastroenterology;  Laterality: N/A;  . EVALUATION UNDER ANESTHESIA WITH HEMORRHOIDECTOMY N/A 01/14/2013   Procedure: EXAM UNDER ANESTHESIA WITH HEMORRHOIDECTOMY POSSIBLE BANDING;  Surgeon: Madilyn Hook, DO;  Location: [place];  Service: General;  Laterality: N/A;  . fibroids removed    . LAPAROSCOPIC TOTAL HYSTERECTOMY  10/29/2006  . MYOMECTOMY  12/18/2001   x 4  . NASAL TURBINATE REDUCTION Bilateral 02/07/2005   inferior and removed  adenoids  . TONSILLECTOMY AND ADENOIDECTOMY  age 1  . TUBAL LIGATION  07/08/2003    There were no vitals filed for this visit.  Subjective Assessment - 09/29/19 0850    Subjective  Patient reports her shoulder has been getting more sore. She has been having more difficulty at night. She is unablke to lie on it. She feels like it is tight    How long can you sit comfortably?  No limit    How long can you walk comfortably?  No limit    Currently in Pain?  Yes    Pain Score  7     Pain Location  Shoulder    Pain Orientation  Right    Pain Descriptors / Indicators  Aching    Pain Type  Chronic pain    Pain Radiating Towards  radiating into her    Pain Onset  More than a month ago    Pain Frequency  Intermittent    Aggravating Factors   reaching    Pain Relieving Factors  rest, naproxyn    Effect of Pain on Daily Activities  Pain with activitiy                       [facility] Adult PT Treatment/Exercise - 09/29/19 0001      Shoulder Exercises: Standing   Other Standing Exercises  pendulums and scpa retraction       Shoulder Exercises: Pulleys  Flexion  2 minutes      Manual Therapy   Manual Therapy  Passive ROM    Manual therapy comments  skilled palpation of trigger points; TFM to biceps tendon;     Joint Mobilization  PA AND INFERIOR GLIDES grade 1-2 for pain releif     Soft tissue mobilization  to posterior shoulder; upper trap; posterior RTC    Passive ROM  i             PT Education - 09/29/19 1016    Education Details  reviewed symptom management    Person(s) Educated  Patient    Methods  Explanation;Demonstration;Tactile cues;Verbal cues    Comprehension  Returned demonstration;Verbal cues required;Tactile cues required;Verbalized understanding       PT Short Term Goals - 09/15/19 1408      PT SHORT TERM GOAL #1   Title  Patient will increase right cervical rotation by 10 degrees    Baseline  improving per visual inspction    Time  3     Period  Weeks    Status  On-going      PT SHORT TERM GOAL #2   Title  Patient will increase active right shoulder flexion by 20 degrees    Time  3    Period  Weeks    Status  On-going    Target Date  09/15/19      PT SHORT TERM GOAL #3   Title  Patient will increase right shoulder flexion strength to 4/5    Baseline  improving    Time  3    Period  Weeks    Status  On-going    Target Date  09/15/19        PT Long Term Goals - 08/25/19 0944      PT LONG TERM GOAL #1   Title  Patient will reach behind her head without pain in order to brush her hair.    Time  6    Period  Weeks    Status  New    Target Date  10/06/19      PT LONG TERM GOAL #2   Title  Patient will reach overhead to a shelf without pain    Time  6    Status  New      PT LONG TERM GOAL #3   Title  Patient will return to work without increased neck and shoulder pain    Time  6    Period  Weeks    Status  New    Target Date  10/06/19            Plan - 09/29/19 1035    Clinical Impression Statement  The patient continues to have increasing pain levels. She did not tolerate light manual therapy well today. She had increased pain with light stretching and trigger point release. She had radiating pain into her hand. Therapy attempteted trigger point reelease to her chould and cervical spine which increased her pain. Therapy appleied stim and heat which helped. She was advised to continue light stretching and activity at home but to contact MD. MD contacted and told she would be sent back to the MD..    Personal Factors and Comorbidities  Profession    Examination-Activity Limitations  Carry;Lift;Reach Overhead    Examination-Participation Restrictions  Meal Prep;Community Activity;Shop    Stability/Clinical Decision Making  Evolving/Moderate complexity    Clinical Decision Making  Moderate    Rehab Potential  Good  PT Frequency  2x / week    PT Duration  6 weeks    PT Treatment/Interventions   ADLs/Self Care Home Management;Electrical Stimulation;Iontophoresis 4mg /ml Dexamethasone;Moist Heat;Traction;Ultrasound;DME Instruction;Functional mobility training;Therapeutic activities;Therapeutic exercise;Patient/family education;Manual techniques;Passive range of motion;Dry needling;Taping;Joint Manipulations;Spinal Manipulations    PT Next Visit Plan  continue needling if trigger points are seen, continue modalities if pain remains in the bicpes groove; continue to advanace functional strengthening as tolerated. advance to kettle bell and bo lifts if she is going back to work.    PT Home Exercise Plan  F6YMTREE tennis ball trigger point release; scap retraction; chest press with wand, pec stretch in various positions, 3-way bicep stretch    Consulted and Agree with Plan of Care  Patient       Patient will benefit from skilled therapeutic intervention in order to improve the following deficits and impairments:  Increased fascial restricitons, Decreased range of motion, Impaired UE functional use, Increased muscle spasms, Decreased strength, Decreased activity tolerance, Postural dysfunction  Visit Diagnosis: Chronic right shoulder pain  Stiffness of right shoulder, not elsewhere classified  Cervicalgia     Problem List Patient Active Problem List   Diagnosis Date Noted  . Gastric nodule   . Cough 12/03/2018  . Primary hypothyroidism 01/26/2017  . Hypercholesterolemia 01/26/2017  . Acute bronchitis 12/21/2016  . Allergic reaction   . Anaphylaxis 05/14/2016  . Idiopathic urticaria 09/29/2015    Carney Living PT DPT 09/29/2019, 10:45 AM  [facility] [address] [place], [place], [address] Phone: [phone]   Fax:  [phone]  Name: Melanie Roberts MRN: [id number] Date of Birth: [birth year]

## 2019-10-01 ENCOUNTER — Encounter: Payer: Self-pay | Admitting: Physical Therapy

## 2019-10-01 ENCOUNTER — Other Ambulatory Visit: Payer: Self-pay

## 2019-10-01 ENCOUNTER — Ambulatory Visit: Payer: Federal, State, Local not specified - PPO | Admitting: Physical Therapy

## 2019-10-01 ENCOUNTER — Telehealth: Payer: Self-pay

## 2019-10-01 ENCOUNTER — Other Ambulatory Visit: Payer: Self-pay | Admitting: Family Medicine

## 2019-10-01 DIAGNOSIS — M25511 Pain in right shoulder: Secondary | ICD-10-CM | POA: Diagnosis not present

## 2019-10-01 DIAGNOSIS — G8929 Other chronic pain: Secondary | ICD-10-CM

## 2019-10-01 DIAGNOSIS — M25611 Stiffness of right shoulder, not elsewhere classified: Secondary | ICD-10-CM | POA: Diagnosis not present

## 2019-10-01 DIAGNOSIS — M542 Cervicalgia: Secondary | ICD-10-CM | POA: Diagnosis not present

## 2019-10-01 NOTE — Therapy (Signed)
[place], [place], [address] Phone: [phone]   Fax:  [phone]  Physical Therapy Treatment  Patient Details  Name: Melanie Roberts MRN: [id number] Date of Birth: Mar 17, 1971 Referring Provider (PT): Dr Nolon Lennert    Encounter Date: 10/01/2019  PT End of Session - 10/01/19 1128    Visit Number  11    Number of Visits  12    Date for PT Re-Evaluation  10/06/19    Authorization Type  [employer]    PT Start Time  0800    PT Stop Time  0831   limited tolerance to treatment   PT Time Calculation (min)  31 min    Activity Tolerance  Patient tolerated treatment well    Behavior During Therapy  [facility] for tasks assessed/performed       Past Medical History:  Diagnosis Date  . GERD (gastroesophageal reflux disease)   . Headache(784.0)    migraines  . Hemorrhoids 12/2012  . Hypertension   . Hypothyroidism   . Seasonal allergies   . UTI (urinary tract infection)   . Wears partial dentures    upper and lower    Past Surgical History:  Procedure Laterality Date  . ADENOIDECTOMY  02/17/2005  . CESAREAN SECTION  07/08/2003  . ESOPHAGOGASTRODUODENOSCOPY (EGD) WITH PROPOFOL N/A 06/19/2019   Procedure: ESOPHAGOGASTRODUODENOSCOPY (EGD) WITH PROPOFOL;  Surgeon: Milus Banister, MD;  [facility];  Service: Gastroenterology;  Laterality: N/A;  . EUS N/A 06/19/2019   Procedure: UPPER ENDOSCOPIC ULTRASOUND (EUS) RADIAL;  Surgeon: Milus Banister, MD;  [facility];  Service: Gastroenterology;  Laterality: N/A;  . EVALUATION UNDER ANESTHESIA WITH HEMORRHOIDECTOMY N/A 01/14/2013   Procedure: EXAM UNDER ANESTHESIA WITH HEMORRHOIDECTOMY POSSIBLE BANDING;  Surgeon: Madilyn Hook, DO;  Location: [facility];  Service: General;  Laterality: N/A;  . fibroids removed    . LAPAROSCOPIC TOTAL HYSTERECTOMY  10/29/2006  . MYOMECTOMY  12/18/2001   x 4  . NASAL TURBINATE REDUCTION Bilateral  02/07/2005   inferior and removed adenoids  . TONSILLECTOMY AND ADENOIDECTOMY  age 48  . TUBAL LIGATION  07/08/2003    There were no vitals filed for this visit.  Subjective Assessment - 10/01/19 0758    Subjective  Patient reports her pain is a little better but her pain level is still high. She worked last night but did not lift sacks. She has been trying to keep it moving.    How long can you sit comfortably?  No limit    How long can you stand comfortably?  No limit    How long can you walk comfortably?  No limit    Currently in Pain?  Yes    Pain Score  5     Pain Location  Shoulder    Pain Orientation  Right    Pain Descriptors / Indicators  Aching    Pain Type  Chronic pain    Pain Onset  More than a month ago    Pain Frequency  Intermittent    Aggravating Factors   reaching    Pain Relieving Factors  rest    Multiple Pain Sites  No                       [facility] Adult PT Treatment/Exercise - 10/01/19 0001      Manual Therapy   Manual Therapy  Passive ROM    Manual therapy comments  skilled palpation of  trigger points; TFM to biceps tendon;     Joint Mobilization  PA AND INFERIOR GLIDES grade 1-2 for pain releif     Soft tissue mobilization  to posterior shoulder; upper trap; posterior RTC    Passive ROM  i             PT Education - 10/01/19 0803    Education Details  symptom mangement    Person(s) Educated  Patient    Methods  Explanation;Demonstration;Tactile cues;Verbal cues    Comprehension  Verbalized understanding;Verbal cues required;Returned demonstration       PT Short Term Goals - 09/15/19 1408      PT SHORT TERM GOAL #1   Title  Patient will increase right cervical rotation by 10 degrees    Baseline  improving per visual inspction    Time  3    Period  Weeks    Status  On-going      PT SHORT TERM GOAL #2   Title  Patient will increase active right shoulder flexion by 20 degrees    Time  3    Period  Weeks    Status   On-going    Target Date  09/15/19      PT SHORT TERM GOAL #3   Title  Patient will increase right shoulder flexion strength to 4/5    Baseline  improving    Time  3    Period  Weeks    Status  On-going    Target Date  09/15/19        PT Long Term Goals - 08/25/19 0944      PT LONG TERM GOAL #1   Title  Patient will reach behind her head without pain in order to brush her hair.    Time  6    Period  Weeks    Status  New    Target Date  10/06/19      PT LONG TERM GOAL #2   Title  Patient will reach overhead to a shelf without pain    Time  6    Status  New      PT LONG TERM GOAL #3   Title  Patient will return to work without increased neck and shoulder pain    Time  6    Period  Weeks    Status  New    Target Date  10/06/19            Plan - 10/01/19 1128    Clinical Impression Statement  Patient continues to be limited by pain and radicular symptoms into her elbow. Therapy attmepted ligght soft tissue mobilization and light shoulder mobilizations. She reported everything caused increased symptoms. At this time we will put the patient on hold and send her back  the MD for further ecvaluation. She had more pain in the front of her shoulder today but continues to have signidicant ain in her subscap area. Therapy wil follow up per MD reccomendations.    Personal Factors and Comorbidities  Profession    Examination-Activity Limitations  Carry;Lift;Reach Overhead    Examination-Participation Restrictions  Meal Prep;Community Activity;Shop    Stability/Clinical Decision Making  Evolving/Moderate complexity    Clinical Decision Making  Moderate    Rehab Potential  Good    PT Frequency  2x / week    PT Duration  6 weeks    PT Treatment/Interventions  ADLs/Self Care Home Management;Electrical Stimulation;Iontophoresis 4mg /ml Dexamethasone;Moist Heat;Traction;Ultrasound;DME Instruction;Functional mobility training;Therapeutic activities;Therapeutic exercise;Patient/family  education;Manual  techniques;Passive range of motion;Dry needling;Taping;Joint Manipulations;Spinal Manipulations    PT Next Visit Plan  continue needling if trigger points are seen, continue modalities if pain remains in the bicpes groove; continue to advanace functional strengthening as tolerated. advance to kettle bell and bo lifts if she is going back to work.    PT Home Exercise Plan  [id number] tennis ball trigger point release; scap retraction; chest press with wand, pec stretch in various positions, 3-way bicep stretch    Consulted and Agree with Plan of Care  Patient       Patient will benefit from skilled therapeutic intervention in order to improve the following deficits and impairments:  Increased fascial restricitons, Decreased range of motion, Impaired UE functional use, Increased muscle spasms, Decreased strength, Decreased activity tolerance, Postural dysfunction  Visit Diagnosis: Chronic right shoulder pain  Stiffness of right shoulder, not elsewhere classified  Cervicalgia     Problem List Patient Active Problem List   Diagnosis Date Noted  . Gastric nodule   . Cough 12/03/2018  . Primary hypothyroidism 01/26/2017  . Hypercholesterolemia 01/26/2017  . Acute bronchitis 12/21/2016  . Allergic reaction   . Anaphylaxis 05/14/2016  . Idiopathic urticaria 09/29/2015    Carney Living PT DPT  10/01/2019, 11:33 AM  [facility] [address] [place], [place], [address] Phone: [phone]   Fax:  [phone]  Name: Melanie Roberts MRN: [id number] Date of Birth: 1971-07-25

## 2019-10-01 NOTE — Telephone Encounter (Signed)
Spoke with pt and informed her of Dr. Lurline Del message. She understood and will await to hear about MRI

## 2019-10-01 NOTE — Telephone Encounter (Signed)
Copied from [place] [id number]. Topic: General - Inquiry >> Oct 01, 2019  8:39 AM Melanie Roberts, NT wrote: Reason for CRM: Patient called in stating she is still having pain in her shoulder. Patient is wanting a call ASAP with what she should do next. PT was supposed to send office notes of the progress of patient with recommendations. Please advise.

## 2019-10-01 NOTE — Telephone Encounter (Signed)
PT did send note. MRI shoulder ordered and will call pt once I have result. PT is going to be on hold pending MRI result

## 2019-10-05 ENCOUNTER — Ambulatory Visit
Admission: RE | Admit: 2019-10-05 | Discharge: 2019-10-05 | Disposition: A | Discharge status: Home / Self Care | Payer: Federal, State, Local not specified - PPO | Source: Ambulatory Visit | Attending: Family Medicine | Admitting: Family Medicine

## 2019-10-05 ENCOUNTER — Other Ambulatory Visit: Payer: Self-pay

## 2019-10-05 DIAGNOSIS — M25511 Pain in right shoulder: Secondary | ICD-10-CM

## 2019-10-05 DIAGNOSIS — G8929 Other chronic pain: Secondary | ICD-10-CM

## 2019-10-06 ENCOUNTER — Ambulatory Visit: Payer: Federal, State, Local not specified - PPO | Admitting: Physical Therapy

## 2019-10-08 ENCOUNTER — Encounter: Payer: Self-pay | Admitting: Family Medicine

## 2019-10-08 ENCOUNTER — Telehealth (INDEPENDENT_AMBULATORY_CARE_PROVIDER_SITE_OTHER): Payer: Federal, State, Local not specified - PPO | Admitting: Family Medicine

## 2019-10-08 ENCOUNTER — Ambulatory Visit: Payer: Federal, State, Local not specified - PPO | Admitting: Physical Therapy

## 2019-10-08 ENCOUNTER — Other Ambulatory Visit: Payer: Self-pay | Admitting: Family Medicine

## 2019-10-08 VITALS — BP 136/93 | HR 83 | Ht 66.0 in | Wt 223.0 lb

## 2019-10-08 DIAGNOSIS — G8929 Other chronic pain: Secondary | ICD-10-CM

## 2019-10-08 DIAGNOSIS — M25511 Pain in right shoulder: Secondary | ICD-10-CM | POA: Diagnosis not present

## 2019-10-08 DIAGNOSIS — M75101 Unspecified rotator cuff tear or rupture of right shoulder, not specified as traumatic: Secondary | ICD-10-CM

## 2019-10-08 MED ORDER — METHYLPREDNISOLONE 4 MG PO TBPK: ORAL_TABLET | ORAL | 0 refills | Status: DC

## 2019-10-08 MED ORDER — HYDROCODONE-ACETAMINOPHEN 5-325 MG PO TABS: 1.0000 | ORAL_TABLET | Freq: Four times a day (QID) | ORAL | 0 refills | Status: DC | PRN
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
## 2019-10-10 ENCOUNTER — Telehealth: Payer: Self-pay | Admitting: Family Medicine

## 2019-10-10 ENCOUNTER — Encounter: Payer: Self-pay | Admitting: Orthopaedic Surgery

## 2019-10-10 ENCOUNTER — Other Ambulatory Visit: Payer: Self-pay

## 2019-10-10 ENCOUNTER — Ambulatory Visit (INDEPENDENT_AMBULATORY_CARE_PROVIDER_SITE_OTHER): Payer: Federal, State, Local not specified - PPO | Admitting: Orthopaedic Surgery

## 2019-10-10 VITALS — Ht 66.0 in | Wt 223.0 lb

## 2019-10-10 DIAGNOSIS — G8929 Other chronic pain: Secondary | ICD-10-CM | POA: Diagnosis not present

## 2019-10-10 DIAGNOSIS — M25511 Pain in right shoulder: Secondary | ICD-10-CM

## 2019-10-10 MED ORDER — LIDOCAINE HCL 1 % IJ SOLN
3.0000 mL | INTRAMUSCULAR | Status: AC | PRN
  Administered 2019-10-10: 11:00:00 3 mL

## 2019-10-10 MED ORDER — METHYLPREDNISOLONE ACETATE 40 MG/ML IJ SUSP
40.0000 mg | INTRAMUSCULAR | Status: AC | PRN
  Administered 2019-10-10: 40 mg via INTRA_ARTICULAR

## 2019-10-10 MED ORDER — BUPIVACAINE HCL 0.5 % IJ SOLN
3.0000 mL | INTRAMUSCULAR | Status: AC | PRN
  Administered 2019-10-10: 3 mL via INTRA_ARTICULAR

## 2019-10-10 NOTE — Telephone Encounter (Signed)
Pt came in and dropped off FMLA form for Dr Bryan Lemma to fill out, I put it in Dr Vivia Ewing folder up front
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
## 2019-10-13 ENCOUNTER — Ambulatory Visit: Payer: Federal, State, Local not specified - PPO | Admitting: Physical Therapy

## 2019-10-13 NOTE — Telephone Encounter (Signed)
Left vm to call back to inform pt that Dr. Loletha Grayer was not in office this week

## 2019-10-15 ENCOUNTER — Ambulatory Visit: Payer: Federal, State, Local not specified - PPO | Admitting: Physical Therapy

## 2019-10-15 NOTE — Telephone Encounter (Signed)
Dr. Loletha Grayer, we will give you pt's FMLA when you are back in office. Called pt & she is aware that you are out of the office until Tuesday. She stated she went to the orthopedic but they did not address the tear. She states she has canceled all her PT visits this week until she hears back from you.

## 2019-10-15 NOTE — Telephone Encounter (Signed)
Pt is wanting to know if she should continue her PT after the tear.She will wait from a call from Dr C or nurse before doing so, as of now she cancelled those appts. FU with pt

## 2019-10-21 ENCOUNTER — Ambulatory Visit (INDEPENDENT_AMBULATORY_CARE_PROVIDER_SITE_OTHER): Payer: Federal, State, Local not specified - PPO | Admitting: Family Medicine

## 2019-10-21 ENCOUNTER — Other Ambulatory Visit: Payer: Self-pay

## 2019-10-21 ENCOUNTER — Encounter: Payer: Self-pay | Admitting: Family Medicine

## 2019-10-21 DIAGNOSIS — M25511 Pain in right shoulder: Secondary | ICD-10-CM | POA: Diagnosis not present

## 2019-10-21 DIAGNOSIS — G8929 Other chronic pain: Secondary | ICD-10-CM | POA: Diagnosis not present

## 2019-10-21 MED ORDER — NABUMETONE 750 MG PO TABS: 750.0000 mg | ORAL_TABLET | Freq: Two times a day (BID) | ORAL | 6 refills | Status: DC | PRN

## 2019-10-21 MED ORDER — BACLOFEN 10 MG PO TABS: 5.0000 mg | ORAL_TABLET | Freq: Three times a day (TID) | ORAL | 3 refills | Status: DC | PRN

## 2019-10-21 NOTE — Telephone Encounter (Signed)
FMLA forms completed and ready for pick up. Please call pt to make her aware. I see what she saw ortho again and had another appt on 1/6
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
## 2019-10-25 ENCOUNTER — Other Ambulatory Visit: Payer: Self-pay | Admitting: Family Medicine

## 2019-10-25 DIAGNOSIS — R0982 Postnasal drip: Secondary | ICD-10-CM

## 2019-10-25 DIAGNOSIS — R059 Cough, unspecified: Secondary | ICD-10-CM

## 2019-10-25 DIAGNOSIS — R05 Cough: Secondary | ICD-10-CM

## 2019-10-29 ENCOUNTER — Other Ambulatory Visit: Payer: Self-pay

## 2019-10-29 ENCOUNTER — Encounter: Payer: Self-pay | Admitting: Orthopaedic Surgery

## 2019-10-29 ENCOUNTER — Ambulatory Visit: Payer: Federal, State, Local not specified - PPO | Admitting: Orthopaedic Surgery

## 2019-10-29 DIAGNOSIS — M75111 Incomplete rotator cuff tear or rupture of right shoulder, not specified as traumatic: Secondary | ICD-10-CM | POA: Diagnosis not present

## 2019-10-29 DIAGNOSIS — M7541 Impingement syndrome of right shoulder: Secondary | ICD-10-CM

## 2019-10-29 NOTE — Progress Notes (Signed)
Office Visit Note   Patient: Melanie Roberts           Date of Birth: 1971/05/10           MRN: [id number] Visit Date: 10/29/2019              Requested by: Ronnald Nian, DO [place],  [place] [address] PCP: Ronnald Nian, DO   Assessment & Plan: Visit Diagnoses:  1. Impingement syndrome of right shoulder   2. Nontraumatic incomplete tear of right rotator cuff     Plan:  I reviewed the with the patient again which shows calcific bursitis anterior to the greater tuberosity and distal supraspinatus.  She has a partial articular surface supraspinatus tear as well.  This was again discussed in detail with the patient and after thorough discussion on treatment options including a trial of glenohumeral injection and 6 weeks of physical therapy versus surgical treatment.   She would like to move forward with arthroscopic surgery.  Risk benefits alternatives and rehab and recovery were reviewed with the patient today.  She will remain on light duty until surgery and then anticipate being out of work at least 6 weeks after surgery.  Follow-Up Instructions: Return for 1 week postop visit.   Orders:  No orders of the defined types were placed in this encounter.  No orders of the defined types were placed in this encounter.     Procedures: No procedures performed   Clinical Data: No additional findings.   Subjective: Chief Complaint  Patient presents with  . Right Shoulder - Follow-up    Melanie Roberts returns today for follow-up of continued and chronic right shoulder pain.  He has shoulder injection was on 10/10/2019 which gave her moderate pain relief and improvement in range of motion.  She briefly met with Dr. Junius Roads recently and after discussion she declined to have a glenohumeral injection.  She states that she lives in chronic pain and is unable to sleep at night.  She has a lot of trouble with right arm function at this point.  She still on  light duty at the [employer].  Physical therapy is only helped partially.   Review of Systems  Constitutional: Negative.   HENT: Negative.   Eyes: Negative.   Respiratory: Negative.   Cardiovascular: Negative.   Endocrine: Negative.   Musculoskeletal: Negative.   Neurological: Negative.   Hematological: Negative.   Psychiatric/Behavioral: Negative.   All other systems reviewed and are negative.    Objective: Vital Signs: There were no vitals taken for this visit.  Physical Exam Vitals and nursing note reviewed.  Constitutional:      Appearance: She is well-developed.  Pulmonary:     Effort: Pulmonary effort is normal.  Skin:    General: Skin is warm.     Capillary Refill: Capillary refill takes less than 2 seconds.  Neurological:     Mental Status: She is alert and oriented to person, place, and time.  Psychiatric:        Behavior: Behavior normal.        Thought Content: Thought content normal.        Judgment: Judgment normal.     Ortho Exam Right shoulder exam shows moderate pain at the extremes of range of motion.  She has minimal restriction range of motion.  Rotator cuff testing grossly intact. Specialty Comments:  No specialty comments available.  Imaging: No results found.   PMFS History: Patient Active Problem List  Diagnosis Date Noted  . Impingement syndrome of right shoulder 10/29/2019  . Nontraumatic incomplete tear of right rotator cuff 10/29/2019  . Chronic right shoulder pain 10/10/2019  . Gastric nodule   . Cough 12/03/2018  . Primary hypothyroidism 01/26/2017  . Hypercholesterolemia 01/26/2017  . Acute bronchitis 12/21/2016  . Allergic reaction   . Anaphylaxis 05/14/2016  . Idiopathic urticaria 09/29/2015   Past Medical History:  Diagnosis Date  . GERD (gastroesophageal reflux disease)   . Headache(784.0)    migraines  . Hemorrhoids 12/2012  . Hypertension   . Hypothyroidism   . Seasonal allergies   . UTI (urinary tract  infection)   . Wears partial dentures    upper and lower    Family History  Problem Relation Age of Onset  . Diabetes Mother   . Fibroids Mother        uterine   . Pancreatic cancer Father   . Breast cancer Maternal Grandmother   . Clotting disorder Maternal Grandmother   . Diabetes Maternal Grandmother   . Liver cancer Maternal Grandfather   . Diabetes Maternal Grandfather   . Pancreatic cancer Paternal Grandmother   . Diabetes Paternal Grandmother   . Stomach cancer Paternal Grandfather   . Diabetes Paternal Grandfather   . Colon cancer Paternal Uncle   . Esophageal cancer Neg Hx     Past Surgical History:  Procedure Laterality Date  . ADENOIDECTOMY  02/17/2005  . CESAREAN SECTION  07/08/2003  . ESOPHAGOGASTRODUODENOSCOPY (EGD) WITH PROPOFOL N/A 06/19/2019   Procedure: ESOPHAGOGASTRODUODENOSCOPY (EGD) WITH PROPOFOL;  Surgeon: Milus Banister, MD;  [facility];  Service: Gastroenterology;  Laterality: N/A;  . EUS N/A 06/19/2019   Procedure: UPPER ENDOSCOPIC ULTRASOUND (EUS) RADIAL;  Surgeon: Milus Banister, MD;  [facility];  Service: Gastroenterology;  Laterality: N/A;  . EVALUATION UNDER ANESTHESIA WITH HEMORRHOIDECTOMY N/A 01/14/2013   Procedure: EXAM UNDER ANESTHESIA WITH HEMORRHOIDECTOMY POSSIBLE BANDING;  Surgeon: Madilyn Hook, DO;  Location: [facility];  Service: General;  Laterality: N/A;  . fibroids removed    . LAPAROSCOPIC TOTAL HYSTERECTOMY  10/29/2006  . MYOMECTOMY  12/18/2001   x 4  . NASAL TURBINATE REDUCTION Bilateral 02/07/2005   inferior and removed adenoids  . TONSILLECTOMY AND ADENOIDECTOMY  age 2  . TUBAL LIGATION  07/08/2003   Social History   Occupational History  . Occupation: clerk  Tobacco Use  . Smoking status: Never Smoker  . Smokeless tobacco: Never Used  Substance and Sexual Activity  . Alcohol use: Not Currently    Alcohol/week: 0.0 standard drinks  . Drug use: No  . Sexual activity: Not on file

## 2019-11-06 ENCOUNTER — Other Ambulatory Visit: Payer: Self-pay | Admitting: Physician Assistant

## 2019-11-06 DIAGNOSIS — G8918 Other acute postprocedural pain: Secondary | ICD-10-CM | POA: Diagnosis not present

## 2019-11-06 DIAGNOSIS — M7541 Impingement syndrome of right shoulder: Secondary | ICD-10-CM | POA: Diagnosis not present

## 2019-11-06 DIAGNOSIS — M659 Synovitis and tenosynovitis, unspecified: Secondary | ICD-10-CM | POA: Diagnosis not present

## 2019-11-06 DIAGNOSIS — M7531 Calcific tendinitis of right shoulder: Secondary | ICD-10-CM | POA: Diagnosis not present

## 2019-11-06 DIAGNOSIS — M75111 Incomplete rotator cuff tear or rupture of right shoulder, not specified as traumatic: Secondary | ICD-10-CM | POA: Diagnosis not present

## 2019-11-06 MED ORDER — OXYCODONE-ACETAMINOPHEN 5-325 MG PO TABS: 1.0000 | ORAL_TABLET | Freq: Three times a day (TID) | ORAL | 0 refills | Status: DC | PRN

## 2019-11-06 MED ORDER — METHOCARBAMOL 500 MG PO TABS: 500.0000 mg | ORAL_TABLET | Freq: Two times a day (BID) | ORAL | 0 refills | Status: DC | PRN

## 2019-11-06 MED ORDER — ONDANSETRON HCL 4 MG PO TABS: 4.0000 mg | ORAL_TABLET | Freq: Three times a day (TID) | ORAL | 0 refills | Status: DC | PRN

## 2019-11-13 ENCOUNTER — Ambulatory Visit (INDEPENDENT_AMBULATORY_CARE_PROVIDER_SITE_OTHER): Payer: Federal, State, Local not specified - PPO | Admitting: Orthopaedic Surgery

## 2019-11-13 ENCOUNTER — Ambulatory Visit (INDEPENDENT_AMBULATORY_CARE_PROVIDER_SITE_OTHER): Payer: Federal, State, Local not specified - PPO

## 2019-11-13 ENCOUNTER — Other Ambulatory Visit: Payer: Self-pay

## 2019-11-13 ENCOUNTER — Encounter: Payer: Self-pay | Admitting: Orthopaedic Surgery

## 2019-11-13 DIAGNOSIS — M7541 Impingement syndrome of right shoulder: Secondary | ICD-10-CM | POA: Diagnosis not present

## 2019-11-13 DIAGNOSIS — M25521 Pain in right elbow: Secondary | ICD-10-CM

## 2019-11-13 DIAGNOSIS — M75111 Incomplete rotator cuff tear or rupture of right shoulder, not specified as traumatic: Secondary | ICD-10-CM | POA: Diagnosis not present

## 2019-11-13 NOTE — Progress Notes (Signed)
Office Visit Note   Patient: Melanie Roberts           Date of Birth: [birth year]           MRN: [id number] Visit Date: 11/13/2019              Requested by: Melanie Nian, DO [place],  [place] [address] PCP: Melanie Nian, DO   Assessment & Plan: Visit Diagnoses:  1. Nontraumatic incomplete tear of right rotator cuff   2. Impingement syndrome of right shoulder   3. Pain in right elbow     Plan: Melanie Roberts is doing well 1 week status post right shoulder scope.  I have made her internal referral for PT.  In terms of the right elbow this is likely due to triceps tendinopathy given the finding of the olecranon spur.  I have recommended Voltaren gel relative rest and ice and oral NSAIDs as needed.  Questions encouraged and answered.  Recheck in 5 weeks for the right shoulder.  Follow-Up Instructions: Return in about 5 weeks (around 12/18/2019).   Orders:  Orders Placed This Encounter  Procedures  . XR Elbow 2 Views Right  . Ambulatory referral to Physical Therapy   No orders of the defined types were placed in this encounter.     Procedures: No procedures performed   Clinical Data: No additional findings.   Subjective: Chief Complaint  Patient presents with  . Right Shoulder - Routine Post Op, Pain    Melanie Roberts is 1 week status post right shoulder arthroscopy debridement and subacromial decompression.  She is doing well overall.  She states that she has already noticed some improvement.  She is also asking to be evaluated for her right elbow soreness.  Denies any injuries or numbness and tingling.  It is tender to direct palpation and touch.   Review of Systems  Constitutional: Negative.   HENT: Negative.   Eyes: Negative.   Respiratory: Negative.   Cardiovascular: Negative.   Endocrine: Negative.   Musculoskeletal: Negative.   Neurological: Negative.   Hematological: Negative.   Psychiatric/Behavioral: Negative.   All other  systems reviewed and are negative.    Objective: Vital Signs: There were no vitals taken for this visit.  Physical Exam Vitals and nursing note reviewed.  Constitutional:      Appearance: She is well-developed.  Pulmonary:     Effort: Pulmonary effort is normal.  Skin:    General: Skin is warm.     Capillary Refill: Capillary refill takes less than 2 seconds.  Neurological:     Mental Status: She is alert and oriented to person, place, and time.  Psychiatric:        Behavior: Behavior normal.        Thought Content: Thought content normal.        Judgment: Judgment normal.     Ortho Exam Right shoulder exam shows healed surgical incisions.  No signs of infection.  She has active range of motion to level of the shoulder without significant pain.  Right elbow exam shows no swelling or signs of infection.  She is directly tender to the olecranon process near the insertion of the triceps.  No significant bursal fluid. Specialty Comments:  No specialty comments available.  Imaging: XR Elbow 2 Views Right  Result Date: 11/13/2019 No acute or structural abnormalities.  Small olecranon spur.    PMFS History: Patient Active Problem List   Diagnosis Date Noted  . Pain in right  elbow 11/13/2019  . Impingement syndrome of right shoulder 10/29/2019  . Nontraumatic incomplete tear of right rotator cuff 10/29/2019  . Chronic right shoulder pain 10/10/2019  . Gastric nodule   . Cough 12/03/2018  . Primary hypothyroidism 01/26/2017  . Hypercholesterolemia 01/26/2017  . Acute bronchitis 12/21/2016  . Allergic reaction   . Anaphylaxis 05/14/2016  . Idiopathic urticaria 09/29/2015   Past Medical History:  Diagnosis Date  . GERD (gastroesophageal reflux disease)   . Headache(784.0)    migraines  . Hemorrhoids 12/2012  . Hypertension   . Hypothyroidism   . Seasonal allergies   . UTI (urinary tract infection)   . Wears partial dentures    upper and lower    Family History    Problem Relation Age of Onset  . Diabetes Mother   . Fibroids Mother        uterine   . Pancreatic cancer Father   . Breast cancer Maternal Grandmother   . Clotting disorder Maternal Grandmother   . Diabetes Maternal Grandmother   . Liver cancer Maternal Grandfather   . Diabetes Maternal Grandfather   . Pancreatic cancer Paternal Grandmother   . Diabetes Paternal Grandmother   . Stomach cancer Paternal Grandfather   . Diabetes Paternal Grandfather   . Colon cancer Paternal Uncle   . Esophageal cancer Neg Hx     Past Surgical History:  Procedure Laterality Date  . ADENOIDECTOMY  02/17/2005  . CESAREAN SECTION  07/08/2003  . ESOPHAGOGASTRODUODENOSCOPY (EGD) WITH PROPOFOL N/A 06/19/2019   Procedure: ESOPHAGOGASTRODUODENOSCOPY (EGD) WITH PROPOFOL;  Surgeon: Melanie Banister, MD;  [facility];  Service: Gastroenterology;  Laterality: N/A;  . EUS N/A 06/19/2019   Procedure: UPPER ENDOSCOPIC ULTRASOUND (EUS) RADIAL;  Surgeon: Melanie Banister, MD;  [facility];  Service: Gastroenterology;  Laterality: N/A;  . EVALUATION UNDER ANESTHESIA WITH HEMORRHOIDECTOMY N/A 01/14/2013   Procedure: EXAM UNDER ANESTHESIA WITH HEMORRHOIDECTOMY POSSIBLE BANDING;  Surgeon: Melanie Hook, DO;  Location: [facility];  Service: General;  Laterality: N/A;  . fibroids removed    . LAPAROSCOPIC TOTAL HYSTERECTOMY  10/29/2006  . MYOMECTOMY  12/18/2001   x 4  . NASAL TURBINATE REDUCTION Bilateral 02/07/2005   inferior and removed adenoids  . TONSILLECTOMY AND ADENOIDECTOMY  age 49  . TUBAL LIGATION  07/08/2003   Social History   Occupational History  . Occupation: clerk  Tobacco Use  . Smoking status: Never Smoker  . Smokeless tobacco: Never Used  Substance and Sexual Activity  . Alcohol use: Not Currently    Alcohol/week: 0.0 standard drinks  . Drug use: No  . Sexual activity: Not on file

## 2019-11-18 ENCOUNTER — Ambulatory Visit: Payer: Federal, State, Local not specified - PPO | Attending: Orthopaedic Surgery | Admitting: Physical Therapy

## 2019-11-18 ENCOUNTER — Encounter: Payer: Self-pay | Admitting: Physical Therapy

## 2019-11-18 ENCOUNTER — Other Ambulatory Visit: Payer: Self-pay

## 2019-11-18 DIAGNOSIS — G8929 Other chronic pain: Secondary | ICD-10-CM | POA: Diagnosis not present

## 2019-11-18 DIAGNOSIS — M25511 Pain in right shoulder: Secondary | ICD-10-CM | POA: Diagnosis not present

## 2019-11-18 DIAGNOSIS — M25611 Stiffness of right shoulder, not elsewhere classified: Secondary | ICD-10-CM | POA: Insufficient documentation

## 2019-11-18 NOTE — Therapy (Signed)
[place], [place], [address] Phone: [phone]   Fax:  [phone]  Physical Therapy Treatment / Re-evaluation  Patient Details  Name: Melanie Roberts MRN: [id number] Date of Birth: [birth year] Referring Provider (PT): Frankey Shown MD   Encounter Date: 11/18/2019  PT End of Session - 11/18/19 1108    Visit Number  12    Number of Visits  24    Date for PT Re-Evaluation  12/30/19    Authorization Type  [employer]    PT Start Time  1101    PT Stop Time  1140    PT Time Calculation (min)  39 min    Activity Tolerance  Patient tolerated treatment well    Behavior During Therapy  [facility] for tasks assessed/performed       Past Medical History:  Diagnosis Date  . GERD (gastroesophageal reflux disease)   . Headache(784.0)    migraines  . Hemorrhoids 12/2012  . Hypertension   . Hypothyroidism   . Seasonal allergies   . UTI (urinary tract infection)   . Wears partial dentures    upper and lower    Past Surgical History:  Procedure Laterality Date  . ADENOIDECTOMY  02/17/2005  . CESAREAN SECTION  07/08/2003  . ESOPHAGOGASTRODUODENOSCOPY (EGD) WITH PROPOFOL N/A 06/19/2019   Procedure: ESOPHAGOGASTRODUODENOSCOPY (EGD) WITH PROPOFOL;  Surgeon: Milus Banister, MD;  [facility];  Service: Gastroenterology;  Laterality: N/A;  . EUS N/A 06/19/2019   Procedure: UPPER ENDOSCOPIC ULTRASOUND (EUS) RADIAL;  Surgeon: Milus Banister, MD;  [facility];  Service: Gastroenterology;  Laterality: N/A;  . EVALUATION UNDER ANESTHESIA WITH HEMORRHOIDECTOMY N/A 01/14/2013   Procedure: EXAM UNDER ANESTHESIA WITH HEMORRHOIDECTOMY POSSIBLE BANDING;  Surgeon: Madilyn Hook, DO;  Location: [facility];  Service: General;  Laterality: N/A;  . fibroids removed    . LAPAROSCOPIC TOTAL HYSTERECTOMY  10/29/2006  . MYOMECTOMY  12/18/2001   x 4  . NASAL TURBINATE REDUCTION Bilateral 02/07/2005   inferior  and removed adenoids  . TONSILLECTOMY AND ADENOIDECTOMY  age 71  . TUBAL LIGATION  07/08/2003    There were no vitals filed for this visit.  Subjective Assessment - 11/18/19 1110    Subjective  pt returns to physical therapy following R shoulder arthroscopic debridement on 11/06/2019. since the surgery she reports she is doing better but feels she can improve more. She    How long can you sit comfortably?  No limit    How long can you walk comfortably?  No limit    Patient Stated Goals  to decrease pain, increase strength    Currently in Pain?  Yes         [facility] PT Assessment - 11/18/19 0001      Assessment   Medical Diagnosis  s/p R shoulder arthroscopic debridement    Referring Provider (PT)  Frankey Shown MD    Onset Date/Surgical Date  --   11/06/2019   Hand Dominance  Right      Precautions   Precaution Comments  lifting 3-5#       AROM   Right Shoulder Extension  48 Degrees    Right Shoulder Flexion  98 Degrees   no pain only weakness   Right Shoulder ABduction  80 Degrees   no pain only weakness   Right Shoulder Internal Rotation  --   greater trochanter   Right Shoulder External Rotation  --   occiptal bone  PROM   Right Shoulder Flexion  120 Degrees    Right Shoulder ABduction  98 Degrees      Strength   Right Shoulder Flexion  3+/5   in available ROM   Right Shoulder Extension  3+/5   in available ROM   Right Shoulder ABduction  3/5   in available ROM   Right Shoulder Internal Rotation  4/5   in available ROM   Right Shoulder External Rotation  4/5   in available ROM                  [facility] Adult PT Treatment/Exercise - 11/18/19 0001      Shoulder Exercises: Standing   External Rotation  Strengthening;Right;12 reps;Theraband    Theraband Level (Shoulder External Rotation)  Level 2 (Red)    Internal Rotation  Strengthening    Theraband Level (Shoulder Internal Rotation)  Level 2 (Red)    Row  Strengthening;Both;12 reps;Theraband     Theraband Level (Shoulder Row)  Level 2 (Red)    Other Standing Exercises  wall push-up 1 x 12      Shoulder Exercises: ROM/Strengthening   Other ROM/Strengthening Exercises  wand flexion/ abduction 1 x 10      Neck Exercises: Stretches   Upper Trapezius Stretch  Right;2 reps;30 seconds             PT Education - 11/18/19 1130    Education Details  reviewed HEP and re-assessment findings    Person(s) Educated  Patient    Methods  Explanation;Verbal cues    Comprehension  Verbalized understanding;Verbal cues required       PT Short Term Goals - 11/18/19 1250      PT SHORT TERM GOAL #1   Title  Patient will increase right cervical rotation by 10 degrees    Period  Weeks    Status  Achieved      PT SHORT TERM GOAL #2   Title  Patient will increase active right shoulder flexion by 20 degrees    Period  Weeks    Status  Partially Met      PT SHORT TERM GOAL #3   Title  Patient will increase right shoulder flexion strength to 4/5    Period  Weeks    Status  Partially Met        PT Long Term Goals - 11/18/19 1251      PT LONG TERM GOAL #1   Title  Patient will reach behind her head without pain in order to brush her hair.    Period  Weeks    Status  Partially Met      PT LONG TERM GOAL #2   Title  Patient will reach overhead to a shelf without pain    Period  Weeks    Status  On-going      PT LONG TERM GOAL #3   Title  Patient will return to work without increased neck and shoulder pain    Period  Weeks    Status  On-going      PT LONG TERM GOAL #4   Title  improve FOTO score to </=36% limited to demo improvement in function    Time  6    Period  Weeks    Status  New    Target Date  12/30/19            Plan - 11/18/19 1243    Clinical Impression Statement  pt returns back to PT  following R shoulder arthroscopic debridement on the 11/06/2019. She continues to demonstrate limited shoulder ROM and weakness as expected following surgery. Continued  working on [employer] and strengthening which she responded to well. She would benefit from physical therapy to decrease R shoulder pain, increaesd ROM and strength, Promote lifting mechanics and return to PLOF by addressing the deficits listed.    Rehab Potential  Good    PT Frequency  2x / week    PT Duration  6 weeks    PT Treatment/Interventions  ADLs/Self Care Home Management;Electrical Stimulation;Iontophoresis 46m/ml Dexamethasone;Moist Heat;Traction;Ultrasound;DME Instruction;Functional mobility training;Therapeutic activities;Therapeutic exercise;Patient/family education;Manual techniques;Passive range of motion;Dry needling;Taping;Joint Manipulations;Spinal Manipulations    PT Next Visit Plan  continue needling if trigger points are seen, continue modalities if pain remains in the bicpes groove; continue to advanace functional strengthening as tolerated. advance to kettle bell and bo lifts if she is going back to work.    PT Home Exercise Plan  [id number] - supine wand flexion. standing wand abduction, shoulder IR/ER, rows, upper trap stretch    Consulted and Agree with Plan of Care  Patient       Patient will benefit from skilled therapeutic intervention in order to improve the following deficits and impairments:  Increased fascial restricitons, Decreased range of motion, Impaired UE functional use, Increased muscle spasms, Decreased strength, Decreased activity tolerance, Postural dysfunction  Visit Diagnosis: Chronic right shoulder pain  Stiffness of right shoulder, not elsewhere classified     Problem List Patient Active Problem List   Diagnosis Date Noted  . Pain in right elbow 11/13/2019  . Impingement syndrome of right shoulder 10/29/2019  . Nontraumatic incomplete tear of right rotator cuff 10/29/2019  . Chronic right shoulder pain 10/10/2019  . Gastric nodule   . Cough 12/03/2018  . Primary hypothyroidism 01/26/2017  . Hypercholesterolemia 01/26/2017  . Acute  bronchitis 12/21/2016  . Allergic reaction   . Anaphylaxis 05/14/2016  . Idiopathic urticaria 09/29/2015   KStarr LakePT, DPT, LAT, ATC  11/18/19  12:57 PM      [facility] CenterCSw[facility]GMucarabones NAl[place] [address]Phone: [phone]  Fax:  [phone] Name: AEmilynn SrinivasanMRN: [id number] of Birth: 707/04/1971

## 2019-11-24 ENCOUNTER — Other Ambulatory Visit: Payer: Self-pay | Admitting: Family Medicine

## 2019-11-24 DIAGNOSIS — K219 Gastro-esophageal reflux disease without esophagitis: Secondary | ICD-10-CM

## 2019-11-25 ENCOUNTER — Telehealth: Payer: Self-pay | Admitting: Orthopaedic Surgery

## 2019-11-25 NOTE — Telephone Encounter (Signed)
Received vm from pt checking on forms. IC,lm for pt to [facility].

## 2019-11-25 NOTE — Telephone Encounter (Signed)
Last OV 10/08/19 Last Fill 12/11/18  #90/3

## 2019-11-28 ENCOUNTER — Ambulatory Visit: Payer: Federal, State, Local not specified - PPO | Attending: Orthopaedic Surgery | Admitting: Physical Therapy

## 2019-11-28 ENCOUNTER — Encounter: Payer: Self-pay | Admitting: Physical Therapy

## 2019-11-28 ENCOUNTER — Other Ambulatory Visit: Payer: Self-pay

## 2019-11-28 DIAGNOSIS — M542 Cervicalgia: Secondary | ICD-10-CM | POA: Diagnosis not present

## 2019-11-28 DIAGNOSIS — G8929 Other chronic pain: Secondary | ICD-10-CM | POA: Diagnosis not present

## 2019-11-28 DIAGNOSIS — M25611 Stiffness of right shoulder, not elsewhere classified: Secondary | ICD-10-CM | POA: Insufficient documentation

## 2019-11-28 DIAGNOSIS — M25511 Pain in right shoulder: Secondary | ICD-10-CM | POA: Insufficient documentation

## 2019-11-28 NOTE — Therapy (Signed)
[place], [place], [address] Phone: [phone]   Fax:  [phone]  Physical Therapy Treatment  Patient Details  Name: Melanie Roberts MRN: [id number] Date of Birth: [birth year] Referring Provider (PT): Frankey Shown MD   Encounter Date: 11/28/2019  PT End of Session - 11/28/19 1004    Visit Number  13    Number of Visits  24    Date for PT Re-Evaluation  12/30/19    Authorization Type  BCBS    PT Start Time  1004    PT Stop Time  1043    PT Time Calculation (min)  39 min    Activity Tolerance  Patient tolerated treatment well    Behavior During Therapy  [facility] for tasks assessed/performed       Past Medical History:  Diagnosis Date  . GERD (gastroesophageal reflux disease)   . Headache(784.0)    migraines  . Hemorrhoids 12/2012  . Hypertension   . Hypothyroidism   . Seasonal allergies   . UTI (urinary tract infection)   . Wears partial dentures    upper and lower    Past Surgical History:  Procedure Laterality Date  . ADENOIDECTOMY  02/17/2005  . CESAREAN SECTION  07/08/2003  . ESOPHAGOGASTRODUODENOSCOPY (EGD) WITH PROPOFOL N/A 06/19/2019   Procedure: ESOPHAGOGASTRODUODENOSCOPY (EGD) WITH PROPOFOL;  Surgeon: Milus Banister, MD;  Location: [facility];  Service: Gastroenterology;  Laterality: N/A;  . EUS N/A 06/19/2019   Procedure: UPPER ENDOSCOPIC ULTRASOUND (EUS) RADIAL;  Surgeon: Milus Banister, MD;  Location: [facility];  Service: Gastroenterology;  Laterality: N/A;  . EVALUATION UNDER ANESTHESIA WITH HEMORRHOIDECTOMY N/A 01/14/2013   Procedure: EXAM UNDER ANESTHESIA WITH HEMORRHOIDECTOMY POSSIBLE BANDING;  Surgeon: Madilyn Hook, DO;  Location: [facility];  Service: General;  Laterality: N/A;  . fibroids removed    . LAPAROSCOPIC TOTAL HYSTERECTOMY  10/29/2006  . MYOMECTOMY  12/18/2001   x 4  . NASAL TURBINATE REDUCTION Bilateral 02/07/2005   inferior and removed  adenoids  . TONSILLECTOMY AND ADENOIDECTOMY  age 36  . TUBAL LIGATION  07/08/2003    There were no vitals filed for this visit.  Subjective Assessment - 11/28/19 1006    Subjective  "I am doing pretty good, no pain just stiffness under my arm today"    Patient Stated Goals  to decrease pain, increase strength    Currently in Pain?  Yes    Pain Score  0-No pain    Pain Orientation  Right    Pain Type  Surgical pain    Pain Onset  More than a month ago    Pain Frequency  Intermittent    Aggravating Factors   reaching overhead    Pain Relieving Factors  rest                       [facility] Adult PT Treatment/Exercise - 11/28/19 1007      Shoulder Exercises: Standing   External Rotation  Strengthening;Right;Theraband;20 reps    Theraband Level (Shoulder External Rotation)  Level 3 (Green)    Internal Rotation  Strengthening;20 reps;Theraband;Right    Row  [employer];Theraband    Theraband Level (Shoulder Row)  Level 3 (Green)    Other Standing Exercises  reaching into cabinet middle shelf 1 x 10 2#, top shelf  1 x 10 2#      Shoulder Exercises: ROM/Strengthening   Nustep  L5 x 5 min UE/LE  Other ROM/Strengthening Exercises  towel IR 1 x 10 pulling across the low back, 1 x 10 pulling up over the shoulder      Shoulder Exercises: Stretch   Other Shoulder Stretches  door way stretch 2 x 30     Other Shoulder Stretches  rhomboid 2 x 30 sec      Manual Therapy   Manual therapy comments  MTPR along teres minor/ major and middle/ anterior deltoid    Joint Mobilization  PA and AP grade III and inferior glides grade III               PT Short Term Goals - 11/18/19 1250      PT SHORT TERM GOAL #1   Title  Patient will increase right cervical rotation by 10 degrees    Period  Weeks    Status  Achieved      PT SHORT TERM GOAL #2   Title  Patient will increase active right shoulder flexion by 20 degrees    Period  Weeks    Status  Partially Met       PT SHORT TERM GOAL #3   Title  Patient will increase right shoulder flexion strength to 4/5    Period  Weeks    Status  Partially Met        PT Long Term Goals - 11/18/19 1251      PT LONG TERM GOAL #1   Title  Patient will reach behind her head without pain in order to brush her hair.    Period  Weeks    Status  Partially Met      PT LONG TERM GOAL #2   Title  Patient will reach overhead to a shelf without pain    Period  Weeks    Status  On-going      PT LONG TERM GOAL #3   Title  Patient will return to work without increased neck and shoulder pain    Period  Weeks    Status  On-going      PT LONG TERM GOAL #4   Title  improve FOTO score to </=36% limited to demo improvement in function    Time  6    Period  Weeks    Status  New    Target Date  12/30/19            Plan - 11/28/19 1041    Clinical Impression Statement  pt reports consistency with her HEP and reports she feels she is making good progress with exercise. utilized STW along middle/ anteroir deltoid and lats. continued working ROM with focus on IR which she did well with towel. She performed all exercises well with no report of increased pain. end of session she noted no increase in pain.    PT Treatment/Interventions  ADLs/Self Care Home Management;Electrical Stimulation;Iontophoresis 68m/ml Dexamethasone;Moist Heat;Traction;Ultrasound;DME Instruction;Functional mobility training;Therapeutic activities;Therapeutic exercise;Patient/family education;Manual techniques;Passive range of motion;Dry needling;Taping;Joint Manipulations;Spinal Manipulations    PT Next Visit Plan  review HEP and update PRN, AAROM > AROM, continue shoulder strengthening, reaching into    PT Home Exercise Plan  [id number] - supine wand flexion. standing wand abduction, shoulder IR/ER, rows, upper trap stretch    Consulted and Agree with Plan of Care  Patient       Patient will benefit from skilled therapeutic intervention in order  to improve the following deficits and impairments:  Increased fascial restricitons, Decreased range of motion, Impaired UE functional use, Increased muscle spasms,  Decreased strength, Decreased activity tolerance, Postural dysfunction  Visit Diagnosis: Chronic right shoulder pain  Stiffness of right shoulder, not elsewhere classified     Problem List Patient Active Problem List   Diagnosis Date Noted  . Pain in right elbow 11/13/2019  . Impingement syndrome of right shoulder 10/29/2019  . Nontraumatic incomplete tear of right rotator cuff 10/29/2019  . Chronic right shoulder pain 10/10/2019  . Gastric nodule   . Cough 12/03/2018  . Primary hypothyroidism 01/26/2017  . Hypercholesterolemia 01/26/2017  . Acute bronchitis 12/21/2016  . Allergic reaction   . Anaphylaxis 05/14/2016  . Idiopathic urticaria 09/29/2015   Starr Lake PT, DPT, LAT, ATC  11/28/19  10:48 AM      [facility] [address] [place], [place], [address] Phone: [phone]   Fax:  [phone]  Name: Jadence Kinlaw MRN: [id number] Date of Birth: 1971/02/01

## 2019-12-01 ENCOUNTER — Other Ambulatory Visit: Payer: Self-pay

## 2019-12-01 ENCOUNTER — Ambulatory Visit: Payer: Federal, State, Local not specified - PPO | Admitting: Physical Therapy

## 2019-12-01 ENCOUNTER — Encounter: Payer: Self-pay | Admitting: Physical Therapy

## 2019-12-01 DIAGNOSIS — M25611 Stiffness of right shoulder, not elsewhere classified: Secondary | ICD-10-CM | POA: Diagnosis not present

## 2019-12-01 DIAGNOSIS — M25511 Pain in right shoulder: Secondary | ICD-10-CM

## 2019-12-01 DIAGNOSIS — M542 Cervicalgia: Secondary | ICD-10-CM | POA: Diagnosis not present

## 2019-12-01 DIAGNOSIS — G8929 Other chronic pain: Secondary | ICD-10-CM

## 2019-12-01 NOTE — Therapy (Signed)
[place], [place], [address] Phone: [phone]   Fax:  [phone]  Physical Therapy Treatment  Patient Details  Name: Melanie Roberts MRN: [id number] Date of Birth: 05-05-1971 Referring Provider (PT): Frankey Shown MD   Encounter Date: 12/01/2019  PT End of Session - 12/01/19 0929    Visit Number  14    Number of Visits  24    Date for PT Re-Evaluation  12/30/19    Authorization Type  BCBS    PT Start Time  0848    PT Stop Time  0936    PT Time Calculation (min)  48 min    Activity Tolerance  Patient tolerated treatment well    Behavior During Therapy  [facility] for tasks assessed/performed       Past Medical History:  Diagnosis Date  . GERD (gastroesophageal reflux disease)   . Headache(784.0)    migraines  . Hemorrhoids 12/2012  . Hypertension   . Hypothyroidism   . Seasonal allergies   . UTI (urinary tract infection)   . Wears partial dentures    upper and lower    Past Surgical History:  Procedure Laterality Date  . ADENOIDECTOMY  02/17/2005  . CESAREAN SECTION  07/08/2003  . ESOPHAGOGASTRODUODENOSCOPY (EGD) WITH PROPOFOL N/A 06/19/2019   Procedure: ESOPHAGOGASTRODUODENOSCOPY (EGD) WITH PROPOFOL;  Surgeon: Milus Banister, MD;  [facility];  Service: Gastroenterology;  Laterality: N/A;  . EUS N/A 06/19/2019   Procedure: UPPER ENDOSCOPIC ULTRASOUND (EUS) RADIAL;  Surgeon: Milus Banister, MD;  [facility];  Service: Gastroenterology;  Laterality: N/A;  . EVALUATION UNDER ANESTHESIA WITH HEMORRHOIDECTOMY N/A 01/14/2013   Procedure: EXAM UNDER ANESTHESIA WITH HEMORRHOIDECTOMY POSSIBLE BANDING;  Surgeon: Madilyn Hook, DO;  Location: [facility];  Service: General;  Laterality: N/A;  . fibroids removed    . LAPAROSCOPIC TOTAL HYSTERECTOMY  10/29/2006  . MYOMECTOMY  12/18/2001   x 4  . NASAL TURBINATE REDUCTION Bilateral 02/07/2005   inferior and removed  adenoids  . TONSILLECTOMY AND ADENOIDECTOMY  age 8  . TUBAL LIGATION  07/08/2003    There were no vitals filed for this visit.  Subjective Assessment - 12/01/19 0850    Subjective  " I am stiffnes this morning which is normal every morning, it typically takes 30 min to release"    Currently in Pain?  Yes    Pain Score  0-No pain    Pain Orientation  Right    Pain Descriptors / Indicators  Aching    Pain Type  Surgical pain    Pain Onset  More than a month ago    Pain Frequency  Intermittent                       [facility] Adult PT Treatment/Exercise - 12/01/19 0001      Shoulder Exercises: Supine   Protraction  Strengthening;Both;20 reps;Weights;Other (comment)   wand   Protraction Weight (lbs)  3    Protraction Limitations  cues for proper form    Diagonals  Strengthening;Right;10 reps;Theraband   D2   Theraband Level (Shoulder Diagonals)  Level 1 (Yellow)      Shoulder Exercises: Prone   Other Prone Exercises  I's T's and Y's 2 x 10 1#   RUE only     Shoulder Exercises: ROM/Strengthening   Other ROM/Strengthening Exercises  flexion with dowel rod with 3# for AAROM       Shoulder Exercises: Stretch  Other Shoulder Stretches  door way stretch 2 x 30     Other Shoulder Stretches  rhomboid 2 x 30 sec      Modalities   Modalities  Moist Heat      Moist Heat Therapy   Number Minutes Moist Heat  10 Minutes    Moist Heat Location  Shoulder   with pt in supine     Manual Therapy   Manual therapy comments  MTPR along teres minor/ major and middle/ anterior deltoid    Joint Mobilization  PA and AP grade III and inferior glides grade III    Soft tissue mobilization  to posterior shoulder; upper trap; posterior RTC    Scapular Mobilization  scapular in all planes with emphasis  on upward rotation               PT Short Term Goals - 11/18/19 1250      PT SHORT TERM GOAL #1   Title  Patient will increase right cervical rotation by 10 degrees     Period  Weeks    Status  Achieved      PT SHORT TERM GOAL #2   Title  Patient will increase active right shoulder flexion by 20 degrees    Period  Weeks    Status  Partially Met      PT SHORT TERM GOAL #3   Title  Patient will increase right shoulder flexion strength to 4/5    Period  Weeks    Status  Partially Met        PT Long Term Goals - 11/18/19 1251      PT LONG TERM GOAL #1   Title  Patient will reach behind her head without pain in order to brush her hair.    Period  Weeks    Status  Partially Met      PT LONG TERM GOAL #2   Title  Patient will reach overhead to a shelf without pain    Period  Weeks    Status  On-going      PT LONG TERM GOAL #3   Title  Patient will return to work without increased neck and shoulder pain    Period  Weeks    Status  On-going      PT LONG TERM GOAL #4   Title  improve FOTO score to </=36% limited to demo improvement in function    Time  6    Period  Weeks    Status  New    Target Date  12/30/19            Plan - 12/01/19 1103    Clinical Impression Statement  pt reports no pain at the time of her treatment session but continues to noted mild sorness in the AM that improves with activity. She continues to responded well tih GHJ / scapular mobs promote reaching overhead/ behind her back. She responded well with strengthening noting biggest challenge with prone strengthening, utilized MHp end of session to calm down pain.    PT Treatment/Interventions  ADLs/Self Care Home Management;Electrical Stimulation;Iontophoresis 75m/ml Dexamethasone;Moist Heat;Traction;Ultrasound;DME Instruction;Functional mobility training;Therapeutic activities;Therapeutic exercise;Patient/family education;Manual techniques;Passive range of motion;Dry needling;Taping;Joint Manipulations;Spinal Manipulations    PT Next Visit Plan  review HEP and update PRN, AAROM > AROM, continue shoulder strengthening, reaching into    PT Home Exercise Plan  [id number] -  supine wand flexion. standing wand abduction, shoulder IR/ER, rows, upper trap stretch    Consulted and Agree with  Plan of Care  Patient       Patient will benefit from skilled therapeutic intervention in order to improve the following deficits and impairments:  Increased fascial restricitons, Decreased range of motion, Impaired UE functional use, Increased muscle spasms, Decreased strength, Decreased activity tolerance, Postural dysfunction  Visit Diagnosis: Chronic right shoulder pain  Stiffness of right shoulder, not elsewhere classified  Cervicalgia     Problem List Patient Active Problem List   Diagnosis Date Noted  . Pain in right elbow 11/13/2019  . Impingement syndrome of right shoulder 10/29/2019  . Nontraumatic incomplete tear of right rotator cuff 10/29/2019  . Chronic right shoulder pain 10/10/2019  . Gastric nodule   . Cough 12/03/2018  . Primary hypothyroidism 01/26/2017  . Hypercholesterolemia 01/26/2017  . Acute bronchitis 12/21/2016  . Allergic reaction   . Anaphylaxis 05/14/2016  . Idiopathic urticaria 09/29/2015   Starr Lake PT, DPT, LAT, ATC  12/01/19  9:30 AM      [facility] [address] [place], [place], [address] Phone: [phone]   Fax:  [phone]  Name: Melanie Roberts MRN: [id number] Date of Birth: 12-02-1970

## 2019-12-03 ENCOUNTER — Other Ambulatory Visit: Payer: Self-pay

## 2019-12-03 ENCOUNTER — Ambulatory Visit: Payer: Federal, State, Local not specified - PPO | Admitting: Physical Therapy

## 2019-12-03 DIAGNOSIS — G8929 Other chronic pain: Secondary | ICD-10-CM | POA: Diagnosis not present

## 2019-12-03 DIAGNOSIS — M542 Cervicalgia: Secondary | ICD-10-CM | POA: Diagnosis not present

## 2019-12-03 DIAGNOSIS — M25611 Stiffness of right shoulder, not elsewhere classified: Secondary | ICD-10-CM

## 2019-12-03 DIAGNOSIS — M25511 Pain in right shoulder: Secondary | ICD-10-CM

## 2019-12-03 NOTE — Therapy (Signed)
[place], [place], [address] Phone: [phone]   Fax:  [phone]  Physical Therapy Treatment  Patient Details  Name: Melanie Roberts MRN: [id number] Date of Birth: 12-05-1970 Referring Provider (PT): Frankey Shown MD   Encounter Date: 12/03/2019  PT End of Session - 12/03/19 0935    Visit Number  15    Number of Visits  24    Date for PT Re-Evaluation  12/30/19    Authorization Type  BCBS    PT Start Time  0932    PT Stop Time  1012    PT Time Calculation (min)  40 min    Activity Tolerance  Patient tolerated treatment well       Past Medical History:  Diagnosis Date  . GERD (gastroesophageal reflux disease)   . Headache(784.0)    migraines  . Hemorrhoids 12/2012  . Hypertension   . Hypothyroidism   . Seasonal allergies   . UTI (urinary tract infection)   . Wears partial dentures    upper and lower    Past Surgical History:  Procedure Laterality Date  . ADENOIDECTOMY  02/17/2005  . CESAREAN SECTION  07/08/2003  . ESOPHAGOGASTRODUODENOSCOPY (EGD) WITH PROPOFOL N/A 06/19/2019   Procedure: ESOPHAGOGASTRODUODENOSCOPY (EGD) WITH PROPOFOL;  Surgeon: Milus Banister, MD;  [facility];  Service: Gastroenterology;  Laterality: N/A;  . EUS N/A 06/19/2019   Procedure: UPPER ENDOSCOPIC ULTRASOUND (EUS) RADIAL;  Surgeon: Milus Banister, MD;  [facility];  Service: Gastroenterology;  Laterality: N/A;  . EVALUATION UNDER ANESTHESIA WITH HEMORRHOIDECTOMY N/A 01/14/2013   Procedure: EXAM UNDER ANESTHESIA WITH HEMORRHOIDECTOMY POSSIBLE BANDING;  Surgeon: Madilyn Hook, DO;  Location: [facility];  Service: General;  Laterality: N/A;  . fibroids removed    . LAPAROSCOPIC TOTAL HYSTERECTOMY  10/29/2006  . MYOMECTOMY  12/18/2001   x 4  . NASAL TURBINATE REDUCTION Bilateral 02/07/2005   inferior and removed adenoids  . TONSILLECTOMY AND ADENOIDECTOMY  age 38  . TUBAL  LIGATION  07/08/2003    There were no vitals filed for this visit.  Subjective Assessment - 12/03/19 0935    Subjective  " I am doing pretty good'    Currently in Pain?  Yes    Pain Score  0-No pain    Pain Orientation  Right    Pain Type  Surgical pain         [facility] PT Assessment - 12/03/19 0001      Assessment   Medical Diagnosis  s/p R shoulder arthroscopic debridement    Referring Provider (PT)  Frankey Shown MD    Hand Dominance  Right                   [facility] Adult PT Treatment/Exercise - 12/03/19 0001      Shoulder Exercises: Prone   Other Prone Exercises  I's T's and Y's 2 x 10 1#      Shoulder Exercises: Sidelying   ABduction  AROM;Right;15 reps   with scapular upward manual assist     Shoulder Exercises: ROM/Strengthening   UBE (Upper Arm Bike)  L2 x 5 min    fwd/bwd x 2:30   Other ROM/Strengthening Exercises  reaching behind the back pushiing elbow agains the wall to promote behind the back reaching 1 x 10      Shoulder Exercises: Stretch   External Rotation Stretch  2 reps;30 seconds   against the wall   Other Shoulder Stretches  door way stretch 2 x 30     Other Shoulder Stretches  rhomboid 2 x 30 sec      Shoulder Exercises: Body Blade   Other Body Blade Exercises  IR/ER 3 x 20 seconds    Other Body Blade Exercises  elbow flexion 3 x 20 seconds      Manual Therapy   Manual therapy comments  MTPR along teres minor/ major and middle/ anterior deltoid    Joint Mobilization  PA and AP grade III and inferior glides grade III, distal clavicle inferior/ posterior mobs grade III    Scapular Mobilization  scapular in all planes with emphasis  on upward rotation             PT Education - 12/03/19 1009    Education Details  updated HEP for shoulder I's, T's and Y's    Person(s) Educated  Patient    Methods  Explanation;Verbal cues;Handout    Comprehension  Verbalized understanding;Verbal cues required       PT Short Term Goals - 11/18/19  1250      PT SHORT TERM GOAL #1   Title  Patient will increase right cervical rotation by 10 degrees    Period  Weeks    Status  Achieved      PT SHORT TERM GOAL #2   Title  Patient will increase active right shoulder flexion by 20 degrees    Period  Weeks    Status  Partially Met      PT SHORT TERM GOAL #3   Title  Patient will increase right shoulder flexion strength to 4/5    Period  Weeks    Status  Partially Met        PT Long Term Goals - 11/18/19 1251      PT LONG TERM GOAL #1   Title  Patient will reach behind her head without pain in order to brush her hair.    Period  Weeks    Status  Partially Met      PT LONG TERM GOAL #2   Title  Patient will reach overhead to a shelf without pain    Period  Weeks    Status  On-going      PT LONG TERM GOAL #3   Title  Patient will return to work without increased neck and shoulder pain    Period  Weeks    Status  On-going      PT LONG TERM GOAL #4   Title  improve FOTO score to </=36% limited to demo improvement in function    Time  6    Period  Weeks    Status  New    Target Date  12/30/19            Plan - 12/03/19 1012    Clinical Impression Statement  pt continues to report no pain with noted stiffness at end range flexion/ abduction located at the A/C joint and upper trap. continued manual trigger point release techniques and distal clavicle mobs to maximize ROM. She did well with strengthening report soreness but no pain. upated HEP today for prone strengthening.    PT Treatment/Interventions  ADLs/Self Care Home Management;Electrical Stimulation;Iontophoresis 4m/ml Dexamethasone;Moist Heat;Traction;Ultrasound;DME Instruction;Functional mobility training;Therapeutic activities;Therapeutic exercise;Patient/family education;Manual techniques;Passive range of motion;Dry needling;Taping;Joint Manipulations;Spinal Manipulations    PT Next Visit Plan  review HEP and update PRN, AAROM > AROM, continue  shoulder/scapular strengthening, reaching into cabinet with weight, carrying weight around gym in RUE  PT Home Exercise Plan  [id number] - supine wand flexion. standing wand abduction, shoulder IR/ER, rows, upper trap stretch, I's T's and Y's    Consulted and Agree with Plan of Care  Patient       Patient will benefit from skilled therapeutic intervention in order to improve the following deficits and impairments:     Visit Diagnosis: Chronic right shoulder pain  Stiffness of right shoulder, not elsewhere classified  Cervicalgia     Problem List Patient Active Problem List   Diagnosis Date Noted  . Pain in right elbow 11/13/2019  . Impingement syndrome of right shoulder 10/29/2019  . Nontraumatic incomplete tear of right rotator cuff 10/29/2019  . Chronic right shoulder pain 10/10/2019  . Gastric nodule   . Cough 12/03/2018  . Primary hypothyroidism 01/26/2017  . Hypercholesterolemia 01/26/2017  . Acute bronchitis 12/21/2016  . Allergic reaction   . Anaphylaxis 05/14/2016  . Idiopathic urticaria 09/29/2015    Starr Lake PT, DPT, LAT, ATC  12/03/19  10:16 AM      [facility] [address] [place], [place], [address] Phone: [phone]   Fax:  [phone]  Name: Melanie Roberts MRN: [id number] Date of Birth: [birth year]

## 2019-12-08 ENCOUNTER — Ambulatory Visit: Payer: Federal, State, Local not specified - PPO | Admitting: Physical Therapy

## 2019-12-10 ENCOUNTER — Other Ambulatory Visit: Payer: Self-pay

## 2019-12-10 ENCOUNTER — Encounter: Payer: Self-pay | Admitting: Physical Therapy

## 2019-12-10 ENCOUNTER — Ambulatory Visit: Payer: Federal, State, Local not specified - PPO | Admitting: Physical Therapy

## 2019-12-10 DIAGNOSIS — G8929 Other chronic pain: Secondary | ICD-10-CM | POA: Diagnosis not present

## 2019-12-10 DIAGNOSIS — M25511 Pain in right shoulder: Secondary | ICD-10-CM | POA: Diagnosis not present

## 2019-12-10 DIAGNOSIS — M25611 Stiffness of right shoulder, not elsewhere classified: Secondary | ICD-10-CM | POA: Diagnosis not present

## 2019-12-10 DIAGNOSIS — M542 Cervicalgia: Secondary | ICD-10-CM

## 2019-12-10 NOTE — Therapy (Signed)
[place], [place], [address] Phone: [phone]   Fax:  [phone]  Physical Therapy Treatment  Patient Details  Name: Melanie Roberts MRN: [id number] Date of Birth: 09-Sep-1971 Referring Provider (PT): Frankey Shown MD   Encounter Date: 12/10/2019  PT End of Session - 12/10/19 0847    Visit Number  16    Number of Visits  24    Date for PT Re-Evaluation  12/30/19    Authorization Type  BCBS    PT Start Time  0845    PT Stop Time  0933    PT Time Calculation (min)  48 min       Past Medical History:  Diagnosis Date  . GERD (gastroesophageal reflux disease)   . Headache(784.0)    migraines  . Hemorrhoids 12/2012  . Hypertension   . Hypothyroidism   . Seasonal allergies   . UTI (urinary tract infection)   . Wears partial dentures    upper and lower    Past Surgical History:  Procedure Laterality Date  . ADENOIDECTOMY  02/17/2005  . CESAREAN SECTION  07/08/2003  . ESOPHAGOGASTRODUODENOSCOPY (EGD) WITH PROPOFOL N/A 06/19/2019   Procedure: ESOPHAGOGASTRODUODENOSCOPY (EGD) WITH PROPOFOL;  Surgeon: Milus Banister, MD;  Location: [facility];  Service: Gastroenterology;  Laterality: N/A;  . EUS N/A 06/19/2019   Procedure: UPPER ENDOSCOPIC ULTRASOUND (EUS) RADIAL;  Surgeon: Milus Banister, MD;  Location: [facility];  Service: Gastroenterology;  Laterality: N/A;  . EVALUATION UNDER ANESTHESIA WITH HEMORRHOIDECTOMY N/A 01/14/2013   Procedure: EXAM UNDER ANESTHESIA WITH HEMORRHOIDECTOMY POSSIBLE BANDING;  Surgeon: Madilyn Hook, DO;  Location: [facility];  Service: General;  Laterality: N/A;  . fibroids removed    . LAPAROSCOPIC TOTAL HYSTERECTOMY  10/29/2006  . MYOMECTOMY  12/18/2001   x 4  . NASAL TURBINATE REDUCTION Bilateral 02/07/2005   inferior and removed adenoids  . TONSILLECTOMY AND ADENOIDECTOMY  age 28  . TUBAL LIGATION  07/08/2003    There were no vitals filed for this  visit.      [facility] PT Assessment - 12/10/19 0001      AROM   Right Shoulder Internal Rotation  --   mid lower lumbar after mobs and stretching                   [facility] Adult PT Treatment/Exercise - 12/10/19 0001      Shoulder Exercises: Prone   Other Prone Exercises  I's T's and Y's 2 x 10 no wt except 2# for T      Shoulder Exercises: Standing   External Rotation  Strengthening;Right;Theraband;20 reps    Theraband Level (Shoulder External Rotation)  Level 2 (Red)    Internal Rotation  20 reps    Theraband Level (Shoulder Internal Rotation)  Level 2 (Red)    Flexion  10 reps    Shoulder Flexion Weight (lbs)  2    ABduction  10 reps    Shoulder ABduction Weight (lbs)  2    Row  Strengthening;20 reps;Theraband    Theraband Level (Shoulder Row)  Level 3 (Green)    Other Standing Exercises  reaching into cabinet middle shelf 1 x 10 2#, top shelf  1 x 10 2#    Other Standing Exercises  UE ranger IR AAROM on floor with single arm, then AAROM bilateral extension, IR (standing cane)       Shoulder Exercises: ROM/Strengthening   UBE (Upper Arm Bike)  L2 x 5  min    fwd/bwd x 2:30     Shoulder Exercises: Stretch   Internal Rotation Stretch  3 reps    Internal Rotation Stretch Limitations  30 sec towel in axilla    Other Shoulder Stretches  door way stretch 2 x 30                PT Short Term Goals - 11/18/19 1250      PT SHORT TERM GOAL #1   Title  Patient will increase right cervical rotation by 10 degrees    Period  Weeks    Status  Achieved      PT SHORT TERM GOAL #2   Title  Patient will increase active right shoulder flexion by 20 degrees    Period  Weeks    Status  Partially Met      PT SHORT TERM GOAL #3   Title  Patient will increase right shoulder flexion strength to 4/5    Period  Weeks    Status  Partially Met        PT Long Term Goals - 11/18/19 1251      PT LONG TERM GOAL #1   Title  Patient will reach behind her head without pain  in order to brush her hair.    Period  Weeks    Status  Partially Met      PT LONG TERM GOAL #2   Title  Patient will reach overhead to a shelf without pain    Period  Weeks    Status  On-going      PT LONG TERM GOAL #3   Title  Patient will return to work without increased neck and shoulder pain    Period  Weeks    Status  On-going      PT LONG TERM GOAL #4   Title  improve FOTO score to </=36% limited to demo improvement in function    Time  6    Period  Weeks    Status  New    Target Date  12/30/19            Plan - 12/10/19 1035    Clinical Impression Statement  Pt arrives reporting no pain except with difficulty reaching behind her back. Self mob performed with Towel in axilla followed by AAROM and PROM. Her AROM improved from right buttock to central lumbar.    PT Next Visit Plan  review HEP and update PRN, AAROM > AROM, continue shoulder/scapular strengthening, reaching into cabinet with weight, carrying weight around gym in [place] - supine wand flexion. standing wand abduction, shoulder IR/ER, rows, upper trap stretch, I's T's and Y's       Patient will benefit from skilled therapeutic intervention in order to improve the following deficits and impairments:  Increased fascial restricitons, Decreased range of motion, Impaired UE functional use, Increased muscle spasms, Decreased strength, Decreased activity tolerance, Postural dysfunction  Visit Diagnosis: Chronic right shoulder pain  Stiffness of right shoulder, not elsewhere classified  Cervicalgia     Problem List Patient Active Problem List   Diagnosis Date Noted  . Pain in right elbow 11/13/2019  . Impingement syndrome of right shoulder 10/29/2019  . Nontraumatic incomplete tear of right rotator cuff 10/29/2019  . Chronic right shoulder pain 10/10/2019  . Gastric nodule   . Cough 12/03/2018  . Primary hypothyroidism 01/26/2017  . Hypercholesterolemia 01/26/2017  .  Acute bronchitis 12/21/2016  .  Allergic reaction   . Anaphylaxis 05/14/2016  . Idiopathic urticaria 09/29/2015    Dorene Ar, PTA 12/10/2019, 10:42 AM  [facility] [address] [place], [place], [address] Phone: [phone]   Fax:  [phone]  Name: Melanie Roberts MRN: [id number] Date of Birth: [birth year]

## 2019-12-15 ENCOUNTER — Encounter: Payer: Self-pay | Admitting: Physical Therapy

## 2019-12-15 ENCOUNTER — Ambulatory Visit: Payer: Federal, State, Local not specified - PPO | Admitting: Physical Therapy

## 2019-12-15 ENCOUNTER — Other Ambulatory Visit: Payer: Self-pay

## 2019-12-15 DIAGNOSIS — M542 Cervicalgia: Secondary | ICD-10-CM | POA: Diagnosis not present

## 2019-12-15 DIAGNOSIS — M25511 Pain in right shoulder: Secondary | ICD-10-CM | POA: Diagnosis not present

## 2019-12-15 DIAGNOSIS — G8929 Other chronic pain: Secondary | ICD-10-CM | POA: Diagnosis not present

## 2019-12-15 DIAGNOSIS — M25611 Stiffness of right shoulder, not elsewhere classified: Secondary | ICD-10-CM

## 2019-12-15 NOTE — Therapy (Signed)
[place], [place], [address] Phone: [phone]   Fax:  [phone]  Physical Therapy Treatment  Patient Details  Name: Melanie Roberts MRN: [id number] Date of Birth: Dec 30, 1970 Referring Provider (PT): Frankey Shown MD   Encounter Date: 12/15/2019  PT End of Session - 12/15/19 0803    Visit Number  17    Number of Visits  24    Date for PT Re-Evaluation  12/30/19    PT Start Time  0802    PT Stop Time  0841    PT Time Calculation (min)  39 min    Activity Tolerance  Patient tolerated treatment well    Behavior During Therapy  [facility] for tasks assessed/performed       Past Medical History:  Diagnosis Date  . GERD (gastroesophageal reflux disease)   . Headache(784.0)    migraines  . Hemorrhoids 12/2012  . Hypertension   . Hypothyroidism   . Seasonal allergies   . UTI (urinary tract infection)   . Wears partial dentures    upper and lower    Past Surgical History:  Procedure Laterality Date  . ADENOIDECTOMY  02/17/2005  . CESAREAN SECTION  07/08/2003  . ESOPHAGOGASTRODUODENOSCOPY (EGD) WITH PROPOFOL N/A 06/19/2019   Procedure: ESOPHAGOGASTRODUODENOSCOPY (EGD) WITH PROPOFOL;  Surgeon: Milus Banister, MD;  Location: [facility];  Service: Gastroenterology;  Laterality: N/A;  . EUS N/A 06/19/2019   Procedure: UPPER ENDOSCOPIC ULTRASOUND (EUS) RADIAL;  Surgeon: Milus Banister, MD;  Location: [facility];  Service: Gastroenterology;  Laterality: N/A;  . EVALUATION UNDER ANESTHESIA WITH HEMORRHOIDECTOMY N/A 01/14/2013   Procedure: EXAM UNDER ANESTHESIA WITH HEMORRHOIDECTOMY POSSIBLE BANDING;  Surgeon: Madilyn Hook, DO;  Location: [facility];  Service: General;  Laterality: N/A;  . fibroids removed    . LAPAROSCOPIC TOTAL HYSTERECTOMY  10/29/2006  . MYOMECTOMY  12/18/2001   x 4  . NASAL TURBINATE REDUCTION Bilateral 02/07/2005   inferior and removed adenoids  . TONSILLECTOMY AND  ADENOIDECTOMY  age 49  . TUBAL LIGATION  07/08/2003    There were no vitals filed for this visit.  Subjective Assessment - 12/15/19 0803    Subjective  "I was throwing some grocerys in the back car and felt pop which was painfull initally and eased off and felt better"    Currently in Pain?  No/denies    Pain Score  0-No pain    Pain Orientation  Right    Aggravating Factors   N/A    Pain Relieving Factors  rest         [facility] PT Assessment - 12/15/19 0001      Assessment   Medical Diagnosis  s/p R shoulder arthroscopic debridement    Referring Provider (PT)  Frankey Shown MD                   [facility] Adult PT Treatment/Exercise - 12/15/19 0001      Shoulder Exercises: Standing   Horizontal ABduction  Strengthening;Theraband;20 reps   cues to touch theraband on chest at same place   Theraband Level (Shoulder Horizontal ABduction)  Level 2 (Red)    External Rotation  Strengthening;Right;Theraband;20 reps    Theraband Level (Shoulder External Rotation)  Level 3 (Green)    Internal Rotation  20 reps    Theraband Level (Shoulder Internal Rotation)  Level 3 (Green)    Flexion  Strengthening;20 reps;Weights   scaption angle   Shoulder Flexion Weight (lbs)  2  Other Standing Exercises  wall wash CW/CCW 15x ea. x 3 sets increasing height ex set by 10-12 inches    Other Standing Exercises  walking around gym carrying 25# kettlebell in RUE x 4      Shoulder Exercises: ROM/Strengthening   UBE (Upper Arm Bike)  L3 x 5 min    fwd/bwd x 2:30     Shoulder Exercises: Stretch   External Rotation Stretch  2 reps;30 seconds   sleeper stretch     Manual Therapy   Manual therapy comments  MTPR focus on proximal tricep, and pec minor/ major    Joint Mobilization  PA GHJ grade III mobs       Neck Exercises: Stretches   Chest Stretch  2 reps;30 seconds               PT Short Term Goals - 11/18/19 1250      PT SHORT TERM GOAL #1   Title  Patient will increase right  cervical rotation by 10 degrees    Period  Weeks    Status  Achieved      PT SHORT TERM GOAL #2   Title  Patient will increase active right shoulder flexion by 20 degrees    Period  Weeks    Status  Partially Met      PT SHORT TERM GOAL #3   Title  Patient will increase right shoulder flexion strength to 4/5    Period  Weeks    Status  Partially Met        PT Long Term Goals - 11/18/19 1251      PT LONG TERM GOAL #1   Title  Patient will reach behind her head without pain in order to brush her hair.    Period  Weeks    Status  Partially Met      PT LONG TERM GOAL #2   Title  Patient will reach overhead to a shelf without pain    Period  Weeks    Status  On-going      PT LONG TERM GOAL #3   Title  Patient will return to work without increased neck and shoulder pain    Period  Weeks    Status  On-going      PT LONG TERM GOAL #4   Title  improve FOTO score to </=36% limited to demo improvement in function    Time  6    Period  Weeks    Status  New    Target Date  12/30/19            Plan - 12/15/19 3428    Clinical Impression Statement  pt reports having a pop occur when she was loading grocerys in th eback of the car with pain noted but reported it was fleeting. continued working on ROM to promote reaching behind the back, and stretching of the pec and external rotators. continued RC strengthening and carrying activities with 25# and strengthening overhead. she noted feeling fatigued at the end of the session but denied pain.    PT Treatment/Interventions  ADLs/Self Care Home Management;Electrical Stimulation;Iontophoresis 32m/ml Dexamethasone;Moist Heat;Traction;Ultrasound;DME Instruction;Functional mobility training;Therapeutic activities;Therapeutic exercise;Patient/family education;Manual techniques;Passive range of motion;Dry needling;Taping;Joint Manipulations;Spinal Manipulations    PT Next Visit Plan  review HEP and update PRN, AAROM > AROM, continue  shoulder/scapular strengthening, reaching into cabinet with weight, carrying weight around gym in [place]- supine wand flexion. standing wand abduction,  shoulder IR/ER, rows, upper trap stretch, I's T's and Y's       Patient will benefit from skilled therapeutic intervention in order to improve the following deficits and impairments:  Increased fascial restricitons, Decreased range of motion, Impaired UE functional use, Increased muscle spasms, Decreased strength, Decreased activity tolerance, Postural dysfunction  Visit Diagnosis: Chronic right shoulder pain  Stiffness of right shoulder, not elsewhere classified  Cervicalgia     Problem List Patient Active Problem List   Diagnosis Date Noted  . Pain in right elbow 11/13/2019  . Impingement syndrome of right shoulder 10/29/2019  . Nontraumatic incomplete tear of right rotator cuff 10/29/2019  . Chronic right shoulder pain 10/10/2019  . Gastric nodule   . Cough 12/03/2018  . Primary hypothyroidism 01/26/2017  . Hypercholesterolemia 01/26/2017  . Acute bronchitis 12/21/2016  . Allergic reaction   . Anaphylaxis 05/14/2016  . Idiopathic urticaria 09/29/2015   Starr Lake PT, DPT, LAT, ATC  12/15/19  8:44 AM      [facility] [address] [place], [place], [address] Phone: [phone]   Fax:  [phone]  Name: Jesslynn Kruck MRN: [id number] Date of Birth: [birth year]

## 2019-12-17 ENCOUNTER — Ambulatory Visit: Payer: Federal, State, Local not specified - PPO | Admitting: Physical Therapy

## 2019-12-17 ENCOUNTER — Other Ambulatory Visit: Payer: Self-pay

## 2019-12-17 ENCOUNTER — Encounter: Payer: Self-pay | Admitting: Physical Therapy

## 2019-12-17 DIAGNOSIS — M542 Cervicalgia: Secondary | ICD-10-CM

## 2019-12-17 DIAGNOSIS — M25611 Stiffness of right shoulder, not elsewhere classified: Secondary | ICD-10-CM

## 2019-12-17 DIAGNOSIS — M25511 Pain in right shoulder: Secondary | ICD-10-CM | POA: Diagnosis not present

## 2019-12-17 DIAGNOSIS — G8929 Other chronic pain: Secondary | ICD-10-CM | POA: Diagnosis not present

## 2019-12-17 NOTE — Therapy (Signed)
[place], [place], [address] Phone: [phone]   Fax:  [phone]  Physical Therapy Treatment  Patient Details  Name: Melanie Roberts MRN: [id number] Date of Birth: January 09, 1971 Referring Provider (PT): Frankey Shown MD   Encounter Date: 12/17/2019  PT End of Session - 12/17/19 0900    Visit Number  18    Number of Visits  24    Date for PT Re-Evaluation  12/30/19    Authorization Type  BCBS    PT Start Time  0845    PT Stop Time  0925    PT Time Calculation (min)  40 min    Activity Tolerance  Patient tolerated treatment well    Behavior During Therapy  [facility] for tasks assessed/performed       Past Medical History:  Diagnosis Date  . GERD (gastroesophageal reflux disease)   . Headache(784.0)    migraines  . Hemorrhoids 12/2012  . Hypertension   . Hypothyroidism   . Seasonal allergies   . UTI (urinary tract infection)   . Wears partial dentures    upper and lower    Past Surgical History:  Procedure Laterality Date  . ADENOIDECTOMY  02/17/2005  . CESAREAN SECTION  07/08/2003  . ESOPHAGOGASTRODUODENOSCOPY (EGD) WITH PROPOFOL N/A 06/19/2019   Procedure: ESOPHAGOGASTRODUODENOSCOPY (EGD) WITH PROPOFOL;  Surgeon: Milus Banister, MD;  Location: [facility];  Service: Gastroenterology;  Laterality: N/A;  . EUS N/A 06/19/2019   Procedure: UPPER ENDOSCOPIC ULTRASOUND (EUS) RADIAL;  Surgeon: Milus Banister, MD;  Location: [facility];  Service: Gastroenterology;  Laterality: N/A;  . EVALUATION UNDER ANESTHESIA WITH HEMORRHOIDECTOMY N/A 01/14/2013   Procedure: EXAM UNDER ANESTHESIA WITH HEMORRHOIDECTOMY POSSIBLE BANDING;  Surgeon: Madilyn Hook, DO;  Location: [facility];  Service: General;  Laterality: N/A;  . fibroids removed    . LAPAROSCOPIC TOTAL HYSTERECTOMY  10/29/2006  . MYOMECTOMY  12/18/2001   x 4  . NASAL TURBINATE REDUCTION Bilateral 02/07/2005   inferior and removed  adenoids  . TONSILLECTOMY AND ADENOIDECTOMY  age 31  . TUBAL LIGATION  07/08/2003    There were no vitals filed for this visit.  Subjective Assessment - 12/17/19 0849    Subjective  Patient has no pain just stiffness. The bone spur in her elbow flairs up sometimes. She had some swelling in her elbow yesterday.    How long can you sit comfortably?  No limit    How long can you stand comfortably?  No limit    How long can you walk comfortably?  No limit    Patient Stated Goals  to decrease pain, increase strength    Currently in Pain?  No/denies                       [facility] Adult PT Treatment/Exercise - 12/17/19 0001      Shoulder Exercises: Standing   Horizontal ABduction  --   cues to touch theraband on chest at same place   External Rotation  Strengthening;Right;Theraband;20 reps    Theraband Level (Shoulder External Rotation)  Level 3 (Green)    Internal Rotation  20 reps    Theraband Level (Shoulder Internal Rotation)  Level 3 (Green)    Flexion  Strengthening;20 reps;Weights   scaption angle   Shoulder Flexion Weight (lbs)  2    Row  Strengthening;20 reps;Theraband    Theraband Level (Shoulder Row)  Level 3 (Green)    Other Standing Exercises  wall wash CW/CCW 15x ea. x 3 sets increasing height ex set by 10-12 inches    Other Standing Exercises  walking around gym carrying 25# kettlebell in RUE x 4      Shoulder Exercises: ROM/Strengthening   UBE (Upper Arm Bike)  L3 x 5 min    fwd/bwd x 2:30     Manual Therapy   Manual therapy comments  MTPR focus on proximal tricep, and pec minor/ major    Joint Mobilization  PA GHJ grade III mobs              PT Education - 12/17/19 0900    Education Details  iewed HEp and symptom mangement    Person(s) Educated  Patient    Methods  Explanation;Demonstration;Tactile cues;Verbal cues    Comprehension  Verbalized understanding;Returned demonstration;Verbal cues required;Tactile cues required       PT Short  Term Goals - 12/17/19 0917      PT SHORT TERM GOAL #1   Title  Patient will increase right cervical rotation by 10 degrees    Baseline  improving per visual inspction    Time  3    Period  Weeks    Status  Achieved    Target Date  09/15/19      PT SHORT TERM GOAL #2   Title  Patient will increase active right shoulder flexion by 20 degrees    Time  3    Period  Weeks    Status  On-going      PT SHORT TERM GOAL #3   Title  Patient will increase right shoulder flexion strength to 4/5    Baseline  improving    Time  3    Period  Weeks    Status  On-going    Target Date  09/15/19        PT Long Term Goals - 11/18/19 1251      PT LONG TERM GOAL #1   Title  Patient will reach behind her head without pain in order to brush her hair.    Period  Weeks    Status  Partially Met      PT LONG TERM GOAL #2   Title  Patient will reach overhead to a shelf without pain    Period  Weeks    Status  On-going      PT LONG TERM GOAL #3   Title  Patient will return to work without increased neck and shoulder pain    Period  Weeks    Status  On-going      PT LONG TERM GOAL #4   Title  improve FOTO score to </=36% limited to demo improvement in function    Time  6    Period  Weeks    Status  New    Target Date  12/30/19            Plan - 12/17/19 0915    Clinical Impression Statement  Patient had a slight limitation in ER but her ER improvied significantly with manual therapy. She is making great progress overall. She mostly feels fatigue with exercises. She had no increase in psain with treatment today. therapy ewill continue to prgoress as tolerated.    Personal Factors and Comorbidities  Profession    Examination-Activity Limitations  Carry;Lift;Reach Overhead    Examination-Participation Restrictions  Meal Prep;Community Activity;Shop    Stability/Clinical Decision Making  Evolving/Moderate complexity    Clinical Decision Making  Moderate  Rehab Potential  Good    PT  Frequency  2x / week    PT Duration  6 weeks    PT Treatment/Interventions  ADLs/Self Care Home Management;Electrical Stimulation;Iontophoresis 71m/ml Dexamethasone;Moist Heat;Traction;Ultrasound;DME Instruction;Functional mobility training;Therapeutic activities;Therapeutic exercise;Patient/family education;Manual techniques;Passive range of motion;Dry needling;Taping;Joint Manipulations;Spinal Manipulations    PT Next Visit Plan  review HEP and update PRN, AAROM > AROM, continue shoulder/scapular strengthening, reaching into cabinet with weight, carrying weight around gym in [place]- supine wand flexion. standing wand abduction, shoulder IR/ER, rows, upper trap stretch, I's T's and Y's    Consulted and Agree with Plan of Care  Patient       Patient will benefit from skilled therapeutic intervention in order to improve the following deficits and impairments:  Increased fascial restricitons, Decreased range of motion, Impaired UE functional use, Increased muscle spasms, Decreased strength, Decreased activity tolerance, Postural dysfunction  Visit Diagnosis: Chronic right shoulder pain  Stiffness of right shoulder, not elsewhere classified  Cervicalgia     Problem List Patient Active Problem List   Diagnosis Date Noted  . Pain in right elbow 11/13/2019  . Impingement syndrome of right shoulder 10/29/2019  . Nontraumatic incomplete tear of right rotator cuff 10/29/2019  . Chronic right shoulder pain 10/10/2019  . Gastric nodule   . Cough 12/03/2018  . Primary hypothyroidism 01/26/2017  . Hypercholesterolemia 01/26/2017  . Acute bronchitis 12/21/2016  . Allergic reaction   . Anaphylaxis 05/14/2016  . Idiopathic urticaria 09/29/2015    DCarney LivingPT DPT  12/17/2019, 9:33 AM  [facility] [address]Phone: [phone]  Fax:  [phone] Name: ADonnielle AddisonMRN: [id number] of Birth: [birth year]

## 2019-12-19 ENCOUNTER — Other Ambulatory Visit: Payer: Self-pay

## 2019-12-19 ENCOUNTER — Ambulatory Visit (INDEPENDENT_AMBULATORY_CARE_PROVIDER_SITE_OTHER): Payer: Federal, State, Local not specified - PPO | Admitting: Orthopaedic Surgery

## 2019-12-19 ENCOUNTER — Encounter: Payer: Self-pay | Admitting: Orthopaedic Surgery

## 2019-12-19 VITALS — Ht 66.0 in | Wt 223.0 lb

## 2019-12-19 DIAGNOSIS — M75111 Incomplete rotator cuff tear or rupture of right shoulder, not specified as traumatic: Secondary | ICD-10-CM

## 2019-12-19 DIAGNOSIS — M7541 Impingement syndrome of right shoulder: Secondary | ICD-10-CM

## 2019-12-19 NOTE — Progress Notes (Signed)
Post-Op Visit Note   Patient: Melanie Roberts           Date of Birth: 1971/01/02           MRN: [id number] Visit Date: 12/19/2019 PCP: Ronnald Nian, DO   Assessment & Plan:  Chief Complaint:  Chief Complaint  Patient presents with  . Right Shoulder - Follow-up    S/p shoulder arthroscopy   Visit Diagnoses:  1. Nontraumatic incomplete tear of right rotator cuff   2. Impingement syndrome of right shoulder     Plan: Willo is 6 weeks s/p right shoulder scope and debridement.  She reports no pain with occasional flareup for which she takes meloxicam.  She has progressed quite well with physical therapy.  She is doing this twice a week.  She continues to be out of work.  Her surgical scars are healed.  She has near full range of motion with minimal pain.  She still lacks strength with manual muscle testing.  At this point she needs to continue with physical therapy for progressive strengthening and incomplete treatment and range of motion.  I'm fine with her return back to work light duty if it is available.  I would like to see her back in another 6 weeks for recheck.  Follow-Up Instructions: Return in about 6 weeks (around 01/30/2020).   Orders:  No orders of the defined types were placed in this encounter.  No orders of the defined types were placed in this encounter.   Imaging: No results found.  PMFS History: Patient Active Problem List   Diagnosis Date Noted  . Pain in right elbow 11/13/2019  . Impingement syndrome of right shoulder 10/29/2019  . Nontraumatic incomplete tear of right rotator cuff 10/29/2019  . Chronic right shoulder pain 10/10/2019  . Gastric nodule   . Cough 12/03/2018  . Primary hypothyroidism 01/26/2017  . Hypercholesterolemia 01/26/2017  . Acute bronchitis 12/21/2016  . Allergic reaction   . Anaphylaxis 05/14/2016  . Idiopathic urticaria 09/29/2015   Past Medical History:  Diagnosis Date  . GERD (gastroesophageal reflux disease)    . Headache(784.0)    migraines  . Hemorrhoids 12/2012  . Hypertension   . Hypothyroidism   . Seasonal allergies   . UTI (urinary tract infection)   . Wears partial dentures    upper and lower    Family History  Problem Relation Age of Onset  . Diabetes Mother   . Fibroids Mother        uterine   . Pancreatic cancer Father   . Breast cancer Maternal Grandmother   . Clotting disorder Maternal Grandmother   . Diabetes Maternal Grandmother   . Liver cancer Maternal Grandfather   . Diabetes Maternal Grandfather   . Pancreatic cancer Paternal Grandmother   . Diabetes Paternal Grandmother   . Stomach cancer Paternal Grandfather   . Diabetes Paternal Grandfather   . Colon cancer Paternal Uncle   . Esophageal cancer Neg Hx     Past Surgical History:  Procedure Laterality Date  . ADENOIDECTOMY  02/17/2005  . CESAREAN SECTION  07/08/2003  . ESOPHAGOGASTRODUODENOSCOPY (EGD) WITH PROPOFOL N/A 06/19/2019   Procedure: ESOPHAGOGASTRODUODENOSCOPY (EGD) WITH PROPOFOL;  Surgeon: Milus Banister, MD;  Location: [facility];  Service: Gastroenterology;  Laterality: N/A;  . EUS N/A 06/19/2019   Procedure: UPPER ENDOSCOPIC ULTRASOUND (EUS) RADIAL;  Surgeon: Milus Banister, MD;  Location: [facility];  Service: Gastroenterology;  Laterality: N/A;  . EVALUATION UNDER ANESTHESIA WITH HEMORRHOIDECTOMY N/A 01/14/2013  Procedure: EXAM UNDER ANESTHESIA WITH HEMORRHOIDECTOMY POSSIBLE BANDING;  Surgeon: Madilyn Hook, DO;  Location: [facility];  Service: General;  Laterality: N/A;  . fibroids removed    . LAPAROSCOPIC TOTAL HYSTERECTOMY  10/29/2006  . MYOMECTOMY  12/18/2001   x 4  . NASAL TURBINATE REDUCTION Bilateral 02/07/2005   inferior and removed adenoids  . TONSILLECTOMY AND ADENOIDECTOMY  age 31  . TUBAL LIGATION  07/08/2003   Social History   Occupational History  . Occupation: clerk  Tobacco Use  . Smoking status: Never Smoker  . Smokeless tobacco: Never Used  Substance and  Sexual Activity  . Alcohol use: Not Currently    Alcohol/week: 0.0 standard drinks  . Drug use: No  . Sexual activity: Not on file

## 2019-12-24 ENCOUNTER — Ambulatory Visit: Payer: Federal, State, Local not specified - PPO | Attending: Orthopaedic Surgery | Admitting: Physical Therapy

## 2019-12-24 ENCOUNTER — Encounter: Payer: Self-pay | Admitting: Physical Therapy

## 2019-12-24 ENCOUNTER — Other Ambulatory Visit: Payer: Self-pay

## 2019-12-24 DIAGNOSIS — M542 Cervicalgia: Secondary | ICD-10-CM | POA: Insufficient documentation

## 2019-12-24 DIAGNOSIS — G8929 Other chronic pain: Secondary | ICD-10-CM | POA: Insufficient documentation

## 2019-12-24 DIAGNOSIS — M25511 Pain in right shoulder: Secondary | ICD-10-CM | POA: Diagnosis not present

## 2019-12-24 DIAGNOSIS — M25611 Stiffness of right shoulder, not elsewhere classified: Secondary | ICD-10-CM | POA: Diagnosis not present

## 2019-12-24 NOTE — Therapy (Addendum)
[place], [place], [address] Phone: [phone]   Fax:  [phone]  Physical Therapy Treatment / discharge  Patient Details  Name: Melanie Roberts MRN: [id number] Date of Birth: 1971-01-23 Referring Provider (PT): Frankey Shown MD   Encounter Date: 12/24/2019  PT End of Session - 12/24/19 0845    Visit Number  19    Number of Visits  24    Date for PT Re-Evaluation  12/30/19    Authorization Type  [employer]    PT Start Time  0801    PT Stop Time  0844    PT Time Calculation (min)  43 min    Activity Tolerance  Patient tolerated treatment well    Behavior During Therapy  [facility] for tasks assessed/performed       Past Medical History:  Diagnosis Date  . GERD (gastroesophageal reflux disease)   . Headache(784.0)    migraines  . Hemorrhoids 12/2012  . Hypertension   . Hypothyroidism   . Seasonal allergies   . UTI (urinary tract infection)   . Wears partial dentures    upper and lower    Past Surgical History:  Procedure Laterality Date  . ADENOIDECTOMY  02/17/2005  . CESAREAN SECTION  07/08/2003  . ESOPHAGOGASTRODUODENOSCOPY (EGD) WITH PROPOFOL N/A 06/19/2019   Procedure: ESOPHAGOGASTRODUODENOSCOPY (EGD) WITH PROPOFOL;  Surgeon: Milus Banister, MD;  Location[facility];  Service: Gastroenterology;  Laterality: N/A;  . EUS N/A 06/19/2019   Procedure: UPPER ENDOSCOPIC ULTRASOUND (EUS) RADIAL;  Surgeon: Milus Banister, MD;  Location[facility];  Service: Gastroenterology;  Laterality: N/A;  . EVALUATION UNDER ANESTHESIA WITH HEMORRHOIDECTOMY N/A 01/14/2013   Procedure: EXAM UNDER ANESTHESIA WITH HEMORRHOIDECTOMY POSSIBLE BANDING;  Surgeon: Madilyn Hook, DO;  Location: [facility];  Service: General;  Laterality: N/A;  . fibroids removed    . LAPAROSCOPIC TOTAL HYSTERECTOMY  10/29/2006  . MYOMECTOMY  12/18/2001   x 4  . NASAL TURBINATE REDUCTION Bilateral 02/07/2005   inferior and  removed adenoids  . TONSILLECTOMY AND ADENOIDECTOMY  age 73  . TUBAL LIGATION  07/08/2003    There were no vitals filed for this visit.  Subjective Assessment - 12/24/19 0804    Subjective  "I saw the MD and he released me to work light duty. He was pleased with progress but would like me to continue with PT"    Patient Stated Goals  to decrease pain, increase strength    Currently in Pain?  No/denies    Pain Score  0-No pain         [facility] PT Assessment - 12/24/19 0001      Assessment   Medical Diagnosis  s/p R shoulder arthroscopic debridement    Referring Provider (PT)  Frankey Shown MD                   [facility] Adult PT Treatment/Exercise - 12/24/19 0001      Shoulder Exercises: Standing   Horizontal ABduction  Strengthening;Both;15 reps;Theraband    Theraband Level (Shoulder Horizontal ABduction)  Level 3 (Green)   with balls squeeze between scapulas   External Rotation  Strengthening;Right;Theraband;20 reps    Theraband Level (Shoulder External Rotation)  Level 4 (Blue)    Internal Rotation  20 reps    Theraband Level (Shoulder Internal Rotation)  Level 4 (Blue)    Flexion  Strengthening;Weights;15 reps    Shoulder Flexion Weight (lbs)  2.5    Flexion Limitations  sliding  hand up/down wall for AA    Other Standing Exercises  wall wash CW/CCW 15x ea. 10-12 inches above shoulder height    Other Standing Exercises  rotation tot he R x 15 with 8# weighted black ball and to the L, handing off to PT which is back to back with pt   to mimick work related activities tossing to the L/R     Shoulder Exercises: ROM/Strengthening   Other ROM/Strengthening Exercises  shouler IR with UE ranger working into end ranges      Shoulder Exercises: IT sales professional  2 reps;30 seconds   using door frame   Other Shoulder Stretches  standing sleeper stretch 2 x 30 sec    Other Shoulder Stretches  rhomboid 2 x 30 sec   with hands crossed     Manual Therapy   Scapular  Mobilization  downward rotation with pt actively retracting shoulder blade 2 x 10               PT Short Term Goals - 12/17/19 0917      PT SHORT TERM GOAL #1   Title  Patient will increase right cervical rotation by 10 degrees    Baseline  improving per visual inspction    Time  3    Period  Weeks    Status  Achieved    Target Date  09/15/19      PT SHORT TERM GOAL #2   Title  Patient will increase active right shoulder flexion by 20 degrees    Time  3    Period  Weeks    Status  On-going      PT SHORT TERM GOAL #3   Title  Patient will increase right shoulder flexion strength to 4/5    Baseline  improving    Time  3    Period  Weeks    Status  On-going    Target Date  09/15/19        PT Long Term Goals - 11/18/19 1251      PT LONG TERM GOAL #1   Title  Patient will reach behind her head without pain in order to brush her hair.    Period  Weeks    Status  Partially Met      PT LONG TERM GOAL #2   Title  Patient will reach overhead to a shelf without pain    Period  Weeks    Status  On-going      PT LONG TERM GOAL #3   Title  Patient will return to work without increased neck and shoulder pain    Period  Weeks    Status  On-going      PT LONG TERM GOAL #4   Title  improve FOTO score to </=36% limited to demo improvement in function    Time  6    Period  Weeks    Status  New    Target Date  12/30/19            Plan - 12/24/19 0846    Clinical Impression Statement  angie reports seeing her MD and was pleased with function releasing her to light duty at work, but to continue with PT for functional strength. cotninued working on shoulder strengthening reaching over head and stabilizing her scapulae. worked on work related activities rotating to the L/R with physioball which she responded well to. plan to continue with PT for 1 x a week for the  next 4 week to work on functional strengthening/ endurance and work toward discharge.    PT  Treatment/Interventions  ADLs/Self Care Home Management;Electrical Stimulation;Iontophoresis 40m/ml Dexamethasone;Moist Heat;Traction;Ultrasound;DME Instruction;Functional mobility training;Therapeutic activities;Therapeutic exercise;Patient/family education;Manual techniques;Passive range of motion;Dry needling;Taping;Joint Manipulations;Spinal Manipulations    PT Next Visit Plan  review HEP and update PRN, AAROM > AROM, continue shoulder/scapular strengthening, reaching into cabinet with weight, carrying [place].    PT Home Exercise Plan  [id number] - supine wand flexion. standing wand abduction, shoulder IR/ER, rows, upper trap stretch, I's T's and Y's    Consulted and Agree with Plan of Care  Patient       Patient will benefit from skilled therapeutic intervention in order to improve the following deficits and impairments:     Visit Diagnosis: Chronic right shoulder pain  Stiffness of right shoulder, not elsewhere classified  Cervicalgia     Problem List Patient Active Problem List   Diagnosis Date Noted  . Pain in right elbow 11/13/2019  . Impingement syndrome of right shoulder 10/29/2019  . Nontraumatic incomplete tear of right rotator cuff 10/29/2019  . Chronic right shoulder pain 10/10/2019  . Gastric nodule   . Cough 12/03/2018  . Primary hypothyroidism 01/26/2017  . Hypercholesterolemia 01/26/2017  . Acute bronchitis 12/21/2016  . Allergic reaction   . Anaphylaxis 05/14/2016  . Idiopathic urticaria 09/29/2015   KStarr LakePT, DPT, LAT, ATC  12/24/19  8:48 AM      [facility] [address]Phone: [phone]  Fax:  [phone] Name: ASalene MohamudMRN: [id number] of Birth: 707/19/1972    PHYSICAL THERAPY DISCHARGE SUMMARY  Visits from Start of Care: 19  Current functional level related to goals /  functional outcomes: See goals   Remaining deficits: As of last attended visit pt was doing well. Current status is unknown.   Education / Equipment: HEP, theraband, posture  Plan: Patient agrees to discharge.  Patient goals were partially met. Patient is being discharged due to not returning since the last visit.  ?????        Melanie Roberts PT, DPT, LAT, ATC  02/03/20  4:27 PM

## 2020-01-07 ENCOUNTER — Other Ambulatory Visit: Payer: Self-pay

## 2020-01-07 ENCOUNTER — Encounter (HOSPITAL_COMMUNITY): Payer: Self-pay | Admitting: Emergency Medicine

## 2020-01-07 DIAGNOSIS — X500XXA Overexertion from strenuous movement or load, initial encounter: Secondary | ICD-10-CM | POA: Insufficient documentation

## 2020-01-07 DIAGNOSIS — I10 Essential (primary) hypertension: Secondary | ICD-10-CM | POA: Diagnosis not present

## 2020-01-07 DIAGNOSIS — Z79899 Other long term (current) drug therapy: Secondary | ICD-10-CM | POA: Diagnosis not present

## 2020-01-07 DIAGNOSIS — Y9301 Activity, walking, marching and hiking: Secondary | ICD-10-CM | POA: Insufficient documentation

## 2020-01-07 DIAGNOSIS — Y999 Unspecified external cause status: Secondary | ICD-10-CM | POA: Insufficient documentation

## 2020-01-07 DIAGNOSIS — S8991XA Unspecified injury of right lower leg, initial encounter: Secondary | ICD-10-CM | POA: Diagnosis not present

## 2020-01-07 DIAGNOSIS — S80911A Unspecified superficial injury of right knee, initial encounter: Secondary | ICD-10-CM | POA: Insufficient documentation

## 2020-01-07 DIAGNOSIS — Y929 Unspecified place or not applicable: Secondary | ICD-10-CM | POA: Diagnosis not present

## 2020-01-07 DIAGNOSIS — E039 Hypothyroidism, unspecified: Secondary | ICD-10-CM | POA: Insufficient documentation

## 2020-01-07 NOTE — ED Triage Notes (Signed)
Patient here from home with complaints of let knee injury while walking dogs. Denies fall but reports that she "hyperextended" it.

## 2020-01-08 ENCOUNTER — Emergency Department (HOSPITAL_COMMUNITY): Payer: Federal, State, Local not specified - PPO

## 2020-01-08 ENCOUNTER — Emergency Department (HOSPITAL_COMMUNITY)
Admission: EM | Admit: 2020-01-08 | Discharge: 2020-01-08 | Disposition: A | Discharge status: Home / Self Care | Payer: Federal, State, Local not specified - PPO | Attending: Emergency Medicine | Admitting: Emergency Medicine

## 2020-01-08 ENCOUNTER — Ambulatory Visit: Payer: Federal, State, Local not specified - PPO | Admitting: Physical Therapy

## 2020-01-08 DIAGNOSIS — S8991XA Unspecified injury of right lower leg, initial encounter: Secondary | ICD-10-CM

## 2020-01-08 NOTE — ED Provider Notes (Signed)
[facility] DEPT Provider Note   CSN: [id number] Arrival date & time: 01/07/20  2329     History Chief Complaint  Patient presents with  . Knee Injury    Melanie Roberts is a 49 y.o. female.  49 year old female presents to the emergency department for evaluation of pain to her right knee.  She states that she was walking her dogs at 10 AM 2 days ago when she was pulled by her dog's and hyperextended her knee.  She has been using a knee sleeve for stability.  Reports increased discomfort with ambulation.  She has been taking naproxen regularly with minimal improvement.  Is actively followed by Dr. Erlinda Hong of orthopedics for prior right rotator cuff injury.  Has not yet followed up with her specialist.  No associated numbness or paresthesias.  The history is provided by the patient. No language interpreter was used.       Past Medical History:  Diagnosis Date  . GERD (gastroesophageal reflux disease)   . Headache(784.0)    migraines  . Hemorrhoids 12/2012  . Hypertension   . Hypothyroidism   . Seasonal allergies   . UTI (urinary tract infection)   . Wears partial dentures    upper and lower    Patient Active Problem List   Diagnosis Date Noted  . Pain in right elbow 11/13/2019  . Impingement syndrome of right shoulder 10/29/2019  . Nontraumatic incomplete tear of right rotator cuff 10/29/2019  . Chronic right shoulder pain 10/10/2019  . Gastric nodule   . Cough 12/03/2018  . Primary hypothyroidism 01/26/2017  . Hypercholesterolemia 01/26/2017  . Acute bronchitis 12/21/2016  . Allergic reaction   . Anaphylaxis 05/14/2016  . Idiopathic urticaria 09/29/2015    Past Surgical History:  Procedure Laterality Date  . ADENOIDECTOMY  02/17/2005  . CESAREAN SECTION  07/08/2003  . ESOPHAGOGASTRODUODENOSCOPY (EGD) WITH PROPOFOL N/A 06/19/2019   Procedure: ESOPHAGOGASTRODUODENOSCOPY (EGD) WITH PROPOFOL;  Surgeon: Milus Banister, MD;  Location:  [facility];  Service: Gastroenterology;  Laterality: N/A;  . EUS N/A 06/19/2019   Procedure: UPPER ENDOSCOPIC ULTRASOUND (EUS) RADIAL;  Surgeon: Milus Banister, MD;  Location: [facility];  Service: Gastroenterology;  Laterality: N/A;  . EVALUATION UNDER ANESTHESIA WITH HEMORRHOIDECTOMY N/A 01/14/2013   Procedure: EXAM UNDER ANESTHESIA WITH HEMORRHOIDECTOMY POSSIBLE BANDING;  Surgeon: Madilyn Hook, DO;  Location: [facility];  Service: General;  Laterality: N/A;  . fibroids removed    . LAPAROSCOPIC TOTAL HYSTERECTOMY  10/29/2006  . MYOMECTOMY  12/18/2001   x 4  . NASAL TURBINATE REDUCTION Bilateral 02/07/2005   inferior and removed adenoids  . TONSILLECTOMY AND ADENOIDECTOMY  age 70  . TUBAL LIGATION  07/08/2003     OB History   No obstetric history on file.     Family History  Problem Relation Age of Onset  . Diabetes Mother   . Fibroids Mother        uterine   . Pancreatic cancer Father   . Breast cancer Maternal Grandmother   . Clotting disorder Maternal Grandmother   . Diabetes Maternal Grandmother   . Liver cancer Maternal Grandfather   . Diabetes Maternal Grandfather   . Pancreatic cancer Paternal Grandmother   . Diabetes Paternal Grandmother   . Stomach cancer Paternal Grandfather   . Diabetes Paternal Grandfather   . Colon cancer Paternal Uncle   . Esophageal cancer Neg Hx     Social History   Tobacco Use  . Smoking status: Never Smoker  .  Smokeless tobacco: Never Used  Substance Use Topics  . Alcohol use: Not Currently    Alcohol/week: 0.0 standard drinks  . Drug use: No    Home Medications Prior to Admission medications   Medication Sig Start Date End Date Taking? Authorizing Provider  albuterol (PROVENTIL HFA;VENTOLIN HFA) 108 (90 Base) MCG/ACT inhaler Inhale 2 puffs into the lungs every 4 (four) hours as needed for wheezing or shortness of breath. 06/13/17   Pollina, Gwenyth Allegra, MD  amLODipine (NORVASC) 10 MG tablet TAKE 1 TABLET BY  MOUTH DAILY 09/25/19   Cirigliano, Mary K, DO  baclofen (LIORESAL) 10 MG tablet Take 0.5-1 tablets (5-10 mg total) by mouth 3 (three) times daily as needed for muscle spasms. 10/21/19   Hilts, Legrand Como, MD  cetirizine (ZYRTEC) 10 MG tablet TAKE 1 TABLET(10 MG TOTAL) BY MOUTH DAILY 10/27/19   Cirigliano, Mary K, DO  EPINEPHrine (EPIPEN 2-PAK) 0.3 mg/0.3 mL IJ SOAJ injection Inject 0.3 mLs (0.3 mg total) into the muscle as needed (allergic reaction). 05/15/16   Whiteheart, Cristal Ford, NP  fluticasone (FLONASE) 50 MCG/ACT nasal spray PLACE 2 SPRAYS IN EACH NOSTRIL DAILY 10/27/19   Cirigliano, Mary K, DO  furosemide (LASIX) 20 MG tablet TAKE 1 TABLET BY MOUTH DAILY AS NEEDED Patient taking differently: Take 20 mg by mouth daily as needed for edema.  02/04/19   Cirigliano, Garvin Fila, DO  HYDROcodone-acetaminophen (NORCO) 5-325 MG tablet Take 1 tablet by mouth every 6 (six) hours as needed for moderate pain. 10/08/19   Cirigliano, Garvin Fila, DO  levothyroxine (SYNTHROID, LEVOTHROID) 150 MCG tablet Take 150-225 mcg by mouth See admin instructions. 163mcg daily except on Thursday and Sunday take 293mcg    [provider]  lidocaine (LIDODERM) 5 % Place 1 patch onto the skin daily. Remove & Discard patch within 12 hours or as directed by MD 07/29/19   Randal Buba, April, MD  methocarbamol (ROBAXIN) 500 MG tablet Take 1 tablet (500 mg total) by mouth 2 (two) times daily as needed. 11/06/19   Aundra Dubin, PA-C  nabumetone (RELAFEN) 750 MG tablet Take 1 tablet (750 mg total) by mouth 2 (two) times daily as needed. 10/21/19   Hilts, Legrand Como, MD  naproxen (NAPROSYN) 375 MG tablet Take 1 tablet (375 mg total) by mouth 2 (two) times daily. 07/29/19   Palumbo, April, MD  omeprazole (PRILOSEC) 40 MG capsule Take 1 capsule (40 mg total) by mouth 2 (two) times daily. 11/25/19   Cirigliano, Mary K, DO  ondansetron (ZOFRAN) 4 MG tablet Take 1 tablet (4 mg total) by mouth every 8 (eight) hours as needed for nausea or vomiting. 11/06/19    Aundra Dubin, PA-C  oxyCODONE-acetaminophen (PERCOCET) 5-325 MG tablet Take 1-2 tablets by mouth 3 (three) times daily as needed for severe pain. 11/06/19   Aundra Dubin, PA-C    Allergies    Ciprofloxacin, Quinolones, and Bee venom  Review of Systems   Review of Systems  Ten systems reviewed and are negative for acute change, except as noted in the HPI.    Physical Exam Updated Vital Signs BP (!) 131/95   Pulse 69   Temp 98.1 F (36.7 C) (Oral)   Resp 15   Ht 5\' 6"  (1.676 m)   Wt 99.3 kg   SpO2 98%   BMI 35.35 kg/m   Physical Exam Vitals and nursing note reviewed.  Constitutional:      General: She is not in acute distress.    Appearance: She is well-developed.  She is not diaphoretic.     Comments: Nontoxic appearing, obese AA female.  HENT:     Head: Normocephalic and atraumatic.  Eyes:     General: No scleral icterus.    Conjunctiva/sclera: Conjunctivae normal.  Pulmonary:     Effort: Pulmonary effort is normal. No respiratory distress.     Comments: Respirations even and unlabored Musculoskeletal:        General: Normal range of motion.     Cervical back: Normal range of motion.     Comments: Tenderness to palpation overlying the right patella without effusion.  Increased discomfort with right knee flexion past 90 degrees.  No bony deformity or crepitus.  No tenderness along the medial or lateral joint line.  Also no posterior knee tenderness or associated laxity.  Skin:    General: Skin is warm and dry.     Coloration: Skin is not pale.     Findings: No erythema or rash.  Neurological:     Mental Status: She is alert and oriented to person, place, and time.     Coordination: Coordination normal.     Comments: Sensation intact in bilateral lower extremities  Psychiatric:        Behavior: Behavior normal.     ED Results / Procedures / Treatments   Labs (all labs ordered are listed, but only abnormal results are displayed) Labs Reviewed - No data  to display  EKG None  Radiology DG Knee Complete 4 Views Right  Result Date: 01/08/2020 CLINICAL DATA:  Injury, hyper extended after fall 01/06/2020 EXAM: RIGHT KNEE - COMPLETE 4+ VIEW COMPARISON:  Knee radiograph 07/27/2020 FINDINGS: A deep lateral femoral notch sign is similar to comparison and may reflect sequela of prior osteochondral defect and ACL injuries. No acute bony abnormality. Specifically, no fracture, subluxation, or dislocation. Soft tissues are unremarkable. IMPRESSION: 1. No acute bony abnormality. 2. Deep lateral femoral notch sign is similar to comparison and may reflect sequela of prior osteochondral defect and ACL injuries. Electronically Signed   By: Lovena Le M.D.   On: 01/08/2020 00:47    Procedures Procedures (including critical care time)  Medications Ordered in ED Medications - No data to display  ED Course  I have reviewed the triage vital signs and the nursing notes.  Pertinent labs & imaging results that were available during my care of the patient were reviewed by me and considered in my medical decision making (see chart for details).    MDM Rules/Calculators/A&P                      Patient presents to the emergency department for evaluation of R knee pain. Patient neurovascularly intact on exam. Imaging negative for fracture, dislocation, bony deformity. No swelling, erythema, heat to touch to the affected area; no concern for septic joint. Compartments in the affected extremity are soft. Plan for supportive management including RICE and NSAIDs; Orthopedic follow up as needed. Return precautions discussed and provided. Patient discharged in stable condition with no unaddressed concerns.   Final Clinical Impression(s) / ED Diagnoses Final diagnoses:  Injury of right knee, initial encounter    Rx / DC Orders ED Discharge Orders    None       Antonietta Breach, PA-C 01/08/20 0345    Molpus, Jenny Reichmann, MD 01/08/20 [phone]

## 2020-01-08 NOTE — Discharge Instructions (Signed)
Apply ice to areas of injury 3-4 times per day to limit inflammation.  Avoid strenuous activity and heavy lifting.  We recommend consistent use of naproxen and your knee sleeve. Follow up with your orthopedist.  Return to the ED for any new or concerning symptoms.

## 2020-01-12 ENCOUNTER — Ambulatory Visit: Payer: Federal, State, Local not specified - PPO | Admitting: Physical Therapy

## 2020-01-19 ENCOUNTER — Encounter: Payer: Federal, State, Local not specified - PPO | Admitting: Physical Therapy

## 2020-01-23 ENCOUNTER — Other Ambulatory Visit: Payer: Self-pay | Admitting: Family Medicine

## 2020-01-23 DIAGNOSIS — R0982 Postnasal drip: Secondary | ICD-10-CM

## 2020-01-23 DIAGNOSIS — R600 Localized edema: Secondary | ICD-10-CM

## 2020-01-30 ENCOUNTER — Other Ambulatory Visit: Payer: Self-pay

## 2020-01-30 ENCOUNTER — Encounter: Payer: Self-pay | Admitting: Orthopaedic Surgery

## 2020-01-30 ENCOUNTER — Ambulatory Visit (INDEPENDENT_AMBULATORY_CARE_PROVIDER_SITE_OTHER): Payer: Federal, State, Local not specified - PPO | Admitting: Orthopaedic Surgery

## 2020-01-30 DIAGNOSIS — M25561 Pain in right knee: Secondary | ICD-10-CM

## 2020-01-30 DIAGNOSIS — Z9889 Other specified postprocedural states: Secondary | ICD-10-CM | POA: Diagnosis not present

## 2020-01-30 NOTE — Progress Notes (Signed)
Office Visit Note   Patient: Melanie Roberts           Date of Birth: 1970-11-15           MRN: [id number] Visit Date: 01/30/2020              Requested by: Ronnald Nian, DO [place],  [place] [address] PCP: Ronnald Nian, DO   Assessment & Plan: Visit Diagnoses:  1. S/P arthroscopy of right shoulder   2. Acute pain of right knee     Plan: Impression is 12 weeks out right shoulder arthroscopic debridement decompression and right knee injury.  In regards to the shoulder, she will continue with her home exercise program to further work on internal rotation strengthening exercises.  We will continue her current work restrictions for another 4 weeks.  She will follow-up with [place] at that point for recheck and hopeful return to work full duty as she works in the [employer].  In regards to the right knee, based on clinical exam I am not concerned for an ACL tear.  She does not feel her pain is significant enough to proceed with cortisone injection.  I provided her with a home exercise program to work on strengthening exercises.  Follow-Up Instructions: Return in about 4 weeks (around 02/27/2020).   Orders:  No orders of the defined types were placed in this encounter.  No orders of the defined types were placed in this encounter.     Procedures: No procedures performed   Clinical Data: No additional findings.   Subjective: Chief Complaint  Patient presents with  . Right Shoulder - Follow-up  . Right Knee - Pain    HPI patient is a pleasant 49 year old female who comes in today approximately 12 weeks out right shoulder arthroscopy.  She has been doing fairly well in regards to the shoulder.  She has minimal to no pain.  She does have slight limitation with internal rotation but she notes that she had to cancel her last 6 physical therapy visits due to recent injury to her right knee.  She has however been working on a home exercise program.   The other issue she brings up today is her right knee.  She stumbled over her 2 puppies on 01/08/2020 causing her right knee to hyperextend.  She was seen in the ED where x-rays were obtained.  X-rays demonstrated possible lateral femoral notch sign indicative of questionable ACL tear.  She comes in today for further evaluation treatment recommendation.  The pain she has is deep within the knee.  No pain at rest.  She does note slight instability but no locking or catching.  Her pain is worse going up stairs.  She has been taking naproxen which does seem to help.  She does wear a knee sleeve at work which also seems to be beneficial.  No previous injury to the right knee.  Review of Systems as detailed in HPI.  All others reviewed and are negative.   Objective: Vital Signs: There were no vitals taken for this visit.  Physical Exam well-developed well-nourished female no acute distress.  Alert and oriented x3.   Ortho Exam examination of her right shoulder reveals full active range of motion in all planes except for internal rotation for which she can get to her back pocket.  She has 4-1/2 out of 5 strength throughout.  She is neurovascular intact distally.  Right knee exam shows a trace effusion.  Range of motion 0 to 115 degrees.  Minimal tenderness at the medial joint line.  No patellofemoral crepitus.  She is stable valgus varus stress.  Negative anterior drawer.  She is neurovascular intact distally.  Specialty Comments:  No specialty comments available.  Imaging: No new imaging   PMFS History: Patient Active Problem List   Diagnosis Date Noted  . Pain in right elbow 11/13/2019  . Impingement syndrome of right shoulder 10/29/2019  . Nontraumatic incomplete tear of right rotator cuff 10/29/2019  . Chronic right shoulder pain 10/10/2019  . Gastric nodule   . Cough 12/03/2018  . Primary hypothyroidism 01/26/2017  . Hypercholesterolemia 01/26/2017  . Acute bronchitis 12/21/2016  .  Allergic reaction   . Anaphylaxis 05/14/2016  . Idiopathic urticaria 09/29/2015   Past Medical History:  Diagnosis Date  . GERD (gastroesophageal reflux disease)   . Headache(784.0)    migraines  . Hemorrhoids 12/2012  . Hypertension   . Hypothyroidism   . Seasonal allergies   . UTI (urinary tract infection)   . Wears partial dentures    upper and lower    Family History  Problem Relation Age of Onset  . Diabetes Mother   . Fibroids Mother        uterine   . Pancreatic cancer Father   . Breast cancer Maternal Grandmother   . Clotting disorder Maternal Grandmother   . Diabetes Maternal Grandmother   . Liver cancer Maternal Grandfather   . Diabetes Maternal Grandfather   . Pancreatic cancer Paternal Grandmother   . Diabetes Paternal Grandmother   . Stomach cancer Paternal Grandfather   . Diabetes Paternal Grandfather   . Colon cancer Paternal Uncle   . Esophageal cancer Neg Hx     Past Surgical History:  Procedure Laterality Date  . ADENOIDECTOMY  02/17/2005  . CESAREAN SECTION  07/08/2003  . ESOPHAGOGASTRODUODENOSCOPY (EGD) WITH PROPOFOL N/A 06/19/2019   Procedure: ESOPHAGOGASTRODUODENOSCOPY (EGD) WITH PROPOFOL;  Surgeon: Milus Banister, MD;  Location: [facility];  Service: Gastroenterology;  Laterality: N/A;  . EUS N/A 06/19/2019   Procedure: UPPER ENDOSCOPIC ULTRASOUND (EUS) RADIAL;  Surgeon: Milus Banister, MD;  [facility];  Service: Gastroenterology;  Laterality: N/A;  . EVALUATION UNDER ANESTHESIA WITH HEMORRHOIDECTOMY N/A 01/14/2013   Procedure: EXAM UNDER ANESTHESIA WITH HEMORRHOIDECTOMY POSSIBLE BANDING;  Surgeon: Madilyn Hook, DO;  Location: [facility];  Service: General;  Laterality: N/A;  . fibroids removed    . LAPAROSCOPIC TOTAL HYSTERECTOMY  10/29/2006  . MYOMECTOMY  12/18/2001   x 4  . NASAL TURBINATE REDUCTION Bilateral 02/07/2005   inferior and removed adenoids  . TONSILLECTOMY AND ADENOIDECTOMY  age 24  . TUBAL LIGATION   07/08/2003   Social History   Occupational History  . Occupation: clerk  Tobacco Use  . Smoking status: Never Smoker  . Smokeless tobacco: Never Used  Substance and Sexual Activity  . Alcohol use: Not Currently    Alcohol/week: 0.0 standard drinks  . Drug use: No  . Sexual activity: Not on file

## 2020-02-04 ENCOUNTER — Telehealth: Payer: Self-pay | Admitting: Orthopaedic Surgery

## 2020-02-04 NOTE — Telephone Encounter (Signed)
Patient called asked if she can get an extension on her FMLA until she sees the doctor in May. Patient said she is still having problem with her elbow flaring up and swelling around her collar bone. Patient has an appointment on Feb 27, 2020. The number to contact patient is [phone]

## 2020-02-04 NOTE — Telephone Encounter (Signed)
yes

## 2020-02-06 ENCOUNTER — Telehealth: Payer: Self-pay | Admitting: Family Medicine

## 2020-02-06 NOTE — Telephone Encounter (Signed)
Pt will need to drop off [employer] form and I would like info as to how long/to what date FMLA should be extended, reason (symptoms, findings) for this, etc. I need this info to complete FMLA form

## 2020-02-06 NOTE — Telephone Encounter (Signed)
I sent pt a message via Mychart, informing her of Dr. Lurline Del feedback.

## 2020-02-06 NOTE — Telephone Encounter (Signed)
Pt calling and saying that her FMLA is going to be extended per Dr Beatriz Stallion [facility], office [phone]. They said it would have to come from Dr Bryan Lemma do to her being the original one to do it. If there are any questions call [phone]. Please advise

## 2020-02-06 NOTE — Telephone Encounter (Signed)
Advised she should contact [employer]. If there is something we need to do in our part.

## 2020-02-12 NOTE — Telephone Encounter (Signed)
Patient called about FMLA form. I gave her the message from Dr. Atilano Median she would need to drop off a form. Patient stated she has no form and FMLA told her to have Dr. Loletha Grayer. Fax an extension letter on [place] to [employer] at [phone]. Her EIN is [id number] and her FMLA number is [id number]. Extension dates requested to 10/22/20 due to continued collarbone pain and right elbow bone spur "flare ups."

## 2020-02-16 NOTE — Telephone Encounter (Signed)
Please see message and advise.  Thank you. ° °

## 2020-02-18 NOTE — Telephone Encounter (Signed)
Note done and left on your desk to be faxed as requested by pt

## 2020-02-18 NOTE — Telephone Encounter (Signed)
Pt aware letter is ready for pick up

## 2020-02-24 ENCOUNTER — Other Ambulatory Visit: Payer: Self-pay

## 2020-02-24 DIAGNOSIS — K219 Gastro-esophageal reflux disease without esophagitis: Secondary | ICD-10-CM

## 2020-02-24 LAB — HM DIABETES EYE EXAM

## 2020-02-24 MED ORDER — OMEPRAZOLE 40 MG PO CPDR: 40.0000 mg | DELAYED_RELEASE_CAPSULE | Freq: Two times a day (BID) | ORAL | 1 refills | Status: DC

## 2020-02-24 NOTE — Telephone Encounter (Signed)
Last OV 10/08/19 Last fill 11/25/19  #90/1 Pt need a follow up visit before any other refills.

## 2020-02-27 ENCOUNTER — Other Ambulatory Visit: Payer: Self-pay

## 2020-02-27 ENCOUNTER — Ambulatory Visit (INDEPENDENT_AMBULATORY_CARE_PROVIDER_SITE_OTHER): Payer: Federal, State, Local not specified - PPO | Admitting: Orthopaedic Surgery

## 2020-02-27 ENCOUNTER — Encounter: Payer: Self-pay | Admitting: Orthopaedic Surgery

## 2020-02-27 VITALS — Ht 66.0 in | Wt 219.0 lb

## 2020-02-27 DIAGNOSIS — Z9889 Other specified postprocedural states: Secondary | ICD-10-CM

## 2020-02-27 NOTE — Progress Notes (Signed)
Post-Op Visit Note   Patient: Melanie Roberts           Date of Birth: 04/20/1971           MRN: [id number] Visit Date: 02/27/2020 PCP: Ronnald Nian, DO   Assessment & Plan:  Chief Complaint:  Chief Complaint  Patient presents with  . Right Shoulder - Follow-up   Visit Diagnoses:  1. S/P arthroscopy of right shoulder     Plan: Patient is a pleasant 49 year old female who comes in today 16 weeks out right knee arthroscopic debridement decompression.  She has been doing fairly well.  She continues to work on her home exercise program.  She does work for [employer] and is currently working with restrictions.  She does not feel she has the endurance to return to her full capacity as of yet.  Examination of the right shoulder reveals full active range of motion all planes.  4-1/2 out of 5 strength throughout.  She is neurovascular intact distally.  At this point, she will continue to work on strengthening exercises to improve her strength and endurance.  We will have her continue with her current work restrictions for another 4 weeks.  Follow-up with [place] in 4 weeks time for anticipation of release to full duty without restrictions.  Call with concerns or questions in the meantime.  Follow-Up Instructions: Return if symptoms worsen or fail to improve.   Orders:  No orders of the defined types were placed in this encounter.  No orders of the defined types were placed in this encounter.   Imaging: No new imaging  PMFS History: Patient Active Problem List   Diagnosis Date Noted  . Pain in right elbow 11/13/2019  . Impingement syndrome of right shoulder 10/29/2019  . Nontraumatic incomplete tear of right rotator cuff 10/29/2019  . Chronic right shoulder pain 10/10/2019  . Gastric nodule   . Cough 12/03/2018  . Primary hypothyroidism 01/26/2017  . Hypercholesterolemia 01/26/2017  . Acute bronchitis 12/21/2016  . Allergic reaction   . Anaphylaxis 05/14/2016  .  Idiopathic urticaria 09/29/2015   Past Medical History:  Diagnosis Date  . GERD (gastroesophageal reflux disease)   . Headache(784.0)    migraines  . Hemorrhoids 12/2012  . Hypertension   . Hypothyroidism   . Seasonal allergies   . UTI (urinary tract infection)   . Wears partial dentures    upper and lower    Family History  Problem Relation Age of Onset  . Diabetes Mother   . Fibroids Mother        uterine   . Pancreatic cancer Father   . Breast cancer Maternal Grandmother   . Clotting disorder Maternal Grandmother   . Diabetes Maternal Grandmother   . Liver cancer Maternal Grandfather   . Diabetes Maternal Grandfather   . Pancreatic cancer Paternal Grandmother   . Diabetes Paternal Grandmother   . Stomach cancer Paternal Grandfather   . Diabetes Paternal Grandfather   . Colon cancer Paternal Uncle   . Esophageal cancer Neg Hx     Past Surgical History:  Procedure Laterality Date  . ADENOIDECTOMY  02/17/2005  . CESAREAN SECTION  07/08/2003  . ESOPHAGOGASTRODUODENOSCOPY (EGD) WITH PROPOFOL N/A 06/19/2019   Procedure: ESOPHAGOGASTRODUODENOSCOPY (EGD) WITH PROPOFOL;  Surgeon: Milus Banister, MD;  [facility];  Service: Gastroenterology;  Laterality: N/A;  . EUS N/A 06/19/2019   Procedure: UPPER ENDOSCOPIC ULTRASOUND (EUS) RADIAL;  Surgeon: Milus Banister, MD;  [facility];  Service:  Gastroenterology;  Laterality: N/A;  . EVALUATION UNDER ANESTHESIA WITH HEMORRHOIDECTOMY N/A 01/14/2013   Procedure: EXAM UNDER ANESTHESIA WITH HEMORRHOIDECTOMY POSSIBLE BANDING;  Surgeon: Madilyn Hook, DO;  Location: [facility];  Service: General;  Laterality: N/A;  . fibroids removed    . LAPAROSCOPIC TOTAL HYSTERECTOMY  10/29/2006  . MYOMECTOMY  12/18/2001   x 4  . NASAL TURBINATE REDUCTION Bilateral 02/07/2005   inferior and removed adenoids  . TONSILLECTOMY AND ADENOIDECTOMY  age 25  . TUBAL LIGATION  07/08/2003   Social History   Occupational History    . Occupation: clerk  Tobacco Use  . Smoking status: Never Smoker  . Smokeless tobacco: Never Used  Substance and Sexual Activity  . Alcohol use: Not Currently    Alcohol/week: 0.0 standard drinks  . Drug use: No  . Sexual activity: Not on file

## 2020-03-26 ENCOUNTER — Encounter: Payer: Self-pay | Admitting: Orthopaedic Surgery

## 2020-03-26 ENCOUNTER — Ambulatory Visit (INDEPENDENT_AMBULATORY_CARE_PROVIDER_SITE_OTHER): Payer: Federal, State, Local not specified - PPO | Admitting: Orthopaedic Surgery

## 2020-03-26 ENCOUNTER — Other Ambulatory Visit: Payer: Self-pay

## 2020-03-26 VITALS — Ht 66.0 in | Wt 232.0 lb

## 2020-03-26 DIAGNOSIS — Z9889 Other specified postprocedural states: Secondary | ICD-10-CM

## 2020-03-26 DIAGNOSIS — M7541 Impingement syndrome of right shoulder: Secondary | ICD-10-CM | POA: Diagnosis not present

## 2020-03-26 DIAGNOSIS — M25511 Pain in right shoulder: Secondary | ICD-10-CM

## 2020-03-26 DIAGNOSIS — G8929 Other chronic pain: Secondary | ICD-10-CM | POA: Diagnosis not present

## 2020-03-26 NOTE — Progress Notes (Signed)
Office Visit Note   Patient: Melanie Roberts           Date of Birth: 1971-06-20           MRN: [id number] Visit Date: 03/26/2020              Requested by: Ronnald Nian, DO [place],  [place] [address] PCP: Ronnald Nian, DO   Assessment & Plan: Visit Diagnoses:  1. S/P arthroscopy of right shoulder   2. Impingement syndrome of right shoulder   3. Chronic right shoulder pain     Plan: Impression is status post right shoulder arthroscopy.  At this point I feel that patient has reached MMI.  Based on our findings and a review of the [employer] guide, her rating is 20%.  At this point we will see her back as needed.  She has returned back to her regular job at [employer].  Questions encouraged and answered.  Follow-Up Instructions: Return if symptoms worsen or fail to improve.   Orders:  No orders of the defined types were placed in this encounter.  No orders of the defined types were placed in this encounter.     Procedures: No procedures performed   Clinical Data: No additional findings.   Subjective: Chief Complaint  Patient presents with  . Right Shoulder - Follow-up    Right shoulder arthroscopy DOS 02-27-2020    Melanie Roberts is status post right shoulder arthroscopy with debridement and subacromial decompression on 11/06/2019.  She is overall improving and doing well.  She has some throbbing on the inside part of her arm and some swelling for which she takes ibuprofen and Tylenol.  She has returned back to work at the [employer].  She has completed PT.  She feels that she is doing as well as she can this point.   Review of Systems  Constitutional: Negative.   HENT: Negative.   Eyes: Negative.   Respiratory: Negative.   Cardiovascular: Negative.   Endocrine: Negative.   Musculoskeletal: Negative.   Neurological: Negative.   Hematological: Negative.   Psychiatric/Behavioral: Negative.   All  other systems reviewed and are negative.    Objective: Vital Signs: Ht 5\' 6"  (1.676 m)   Wt 232 lb (105.2 kg)   BMI 37.45 kg/m   Physical Exam Vitals and nursing note reviewed.  Constitutional:      Appearance: She is well-developed.  Pulmonary:     Effort: Pulmonary effort is normal.  Skin:    General: Skin is warm.     Capillary Refill: Capillary refill takes less than 2 seconds.  Neurological:     Mental Status: She is alert and oriented to person, place, and time.  Psychiatric:        Behavior: Behavior normal.        Thought Content: Thought content normal.        Judgment: Judgment normal.     Ortho Exam Right shoulder shows mild restriction in range of motion.  Manual muscle testing is 5- out of 5. Specialty Comments:  No specialty comments available.  Imaging: No results found.   PMFS History: Patient Active Problem List   Diagnosis Date Noted  . Pain in right elbow 11/13/2019  . Impingement syndrome of right shoulder 10/29/2019  . Nontraumatic incomplete tear of right rotator cuff 10/29/2019  . Chronic right shoulder pain 10/10/2019  . Gastric nodule   . Cough 12/03/2018  . Primary hypothyroidism 01/26/2017  .  Hypercholesterolemia 01/26/2017  . Acute bronchitis 12/21/2016  . Allergic reaction   . Anaphylaxis 05/14/2016  . Idiopathic urticaria 09/29/2015   Past Medical History:  Diagnosis Date  . GERD (gastroesophageal reflux disease)   . Headache(784.0)    migraines  . Hemorrhoids 12/2012  . Hypertension   . Hypothyroidism   . Seasonal allergies   . UTI (urinary tract infection)   . Wears partial dentures    upper and lower    Family History  Problem Relation Age of Onset  . Diabetes Mother   . Fibroids Mother        uterine   . Pancreatic cancer Father   . Breast cancer Maternal Grandmother   . Clotting disorder Maternal Grandmother   . Diabetes Maternal Grandmother   . Liver cancer Maternal Grandfather   . Diabetes Maternal  Grandfather   . Pancreatic cancer Paternal Grandmother   . Diabetes Paternal Grandmother   . Stomach cancer Paternal Grandfather   . Diabetes Paternal Grandfather   . Colon cancer Paternal Uncle   . Esophageal cancer Neg Hx     Past Surgical History:  Procedure Laterality Date  . ADENOIDECTOMY  02/17/2005  . CESAREAN SECTION  07/08/2003  . ESOPHAGOGASTRODUODENOSCOPY (EGD) WITH PROPOFOL N/A 06/19/2019   Procedure: ESOPHAGOGASTRODUODENOSCOPY (EGD) WITH PROPOFOL;  Surgeon: Milus Banister, MD;  [facility];  Service: Gastroenterology;  Laterality: N/A;  . EUS N/A 06/19/2019   Procedure: UPPER ENDOSCOPIC ULTRASOUND (EUS) RADIAL;  Surgeon: Milus Banister, MD;  [facility];  Service: Gastroenterology;  Laterality: N/A;  . EVALUATION UNDER ANESTHESIA WITH HEMORRHOIDECTOMY N/A 01/14/2013   Procedure: EXAM UNDER ANESTHESIA WITH HEMORRHOIDECTOMY POSSIBLE BANDING;  Surgeon: Madilyn Hook, DO;  Location: [facility];  Service: General;  Laterality: N/A;  . fibroids removed    . LAPAROSCOPIC TOTAL HYSTERECTOMY  10/29/2006  . MYOMECTOMY  12/18/2001   x 4  . NASAL TURBINATE REDUCTION Bilateral 02/07/2005   inferior and removed adenoids  . TONSILLECTOMY AND ADENOIDECTOMY  age 49  . TUBAL LIGATION  07/08/2003   Social History   Occupational History  . Occupation: clerk  Tobacco Use  . Smoking status: Never Smoker  . Smokeless tobacco: Never Used  Substance and Sexual Activity  . Alcohol use: Not Currently    Alcohol/week: 0.0 standard drinks  . Drug use: No  . Sexual activity: Not on file

## 2020-04-22 ENCOUNTER — Other Ambulatory Visit: Payer: Self-pay | Admitting: Family Medicine

## 2020-04-22 DIAGNOSIS — R0982 Postnasal drip: Secondary | ICD-10-CM

## 2020-04-22 DIAGNOSIS — R059 Cough, unspecified: Secondary | ICD-10-CM

## 2020-04-22 NOTE — Telephone Encounter (Signed)
Last OV 10/07/2020 Last fill 10/27/19  #90/1 Last fill 01/26/20  #16g/2

## 2020-05-20 DIAGNOSIS — E039 Hypothyroidism, unspecified: Secondary | ICD-10-CM | POA: Diagnosis not present

## 2020-05-21 DIAGNOSIS — E039 Hypothyroidism, unspecified: Secondary | ICD-10-CM | POA: Diagnosis not present

## 2020-05-24 ENCOUNTER — Other Ambulatory Visit: Payer: Self-pay

## 2020-05-24 ENCOUNTER — Other Ambulatory Visit: Payer: Self-pay | Admitting: Family Medicine

## 2020-05-24 DIAGNOSIS — K219 Gastro-esophageal reflux disease without esophagitis: Secondary | ICD-10-CM

## 2020-05-24 MED ORDER — OMEPRAZOLE 40 MG PO CPDR: 40.0000 mg | DELAYED_RELEASE_CAPSULE | Freq: Two times a day (BID) | ORAL | 1 refills | Status: DC

## 2020-05-27 ENCOUNTER — Other Ambulatory Visit: Payer: Self-pay | Admitting: Family Medicine

## 2020-06-21 ENCOUNTER — Other Ambulatory Visit: Payer: Self-pay | Admitting: Family Medicine

## 2020-06-21 DIAGNOSIS — K219 Gastro-esophageal reflux disease without esophagitis: Secondary | ICD-10-CM

## 2020-06-21 NOTE — Telephone Encounter (Signed)
Medication refill request for Omeprazole.  10/08/19 Last OV 05/24/20 Last refill date // #90 // Refills - 1  Refill request denied.

## 2020-07-02 DIAGNOSIS — E039 Hypothyroidism, unspecified: Secondary | ICD-10-CM | POA: Diagnosis not present

## 2020-07-22 ENCOUNTER — Other Ambulatory Visit: Payer: Self-pay | Admitting: Family Medicine

## 2020-07-22 DIAGNOSIS — K219 Gastro-esophageal reflux disease without esophagitis: Secondary | ICD-10-CM

## 2020-07-27 ENCOUNTER — Other Ambulatory Visit: Payer: Self-pay

## 2020-07-28 ENCOUNTER — Ambulatory Visit: Payer: Federal, State, Local not specified - PPO | Admitting: Family Medicine

## 2020-07-28 ENCOUNTER — Encounter: Payer: Self-pay | Admitting: Family Medicine

## 2020-07-28 VITALS — BP 118/82 | HR 81 | Temp 97.4°F | Ht 66.0 in | Wt 225.6 lb

## 2020-07-28 DIAGNOSIS — N644 Mastodynia: Secondary | ICD-10-CM | POA: Diagnosis not present

## 2020-07-28 DIAGNOSIS — N6314 Unspecified lump in the right breast, lower inner quadrant: Secondary | ICD-10-CM

## 2020-07-28 DIAGNOSIS — Z2821 Immunization not carried out because of patient refusal: Secondary | ICD-10-CM

## 2020-07-28 NOTE — Progress Notes (Signed)
Melanie Roberts is a 49 y.o. female  Chief Complaint  Patient presents with  . Acute Visit    pain/lump in RT breast x 1 month, declines flu shot today    YYQ:MGNOIB Melanie Roberts is a 49 y.o. female who complains of 1 mo h/o B/L breast pain Rt > Lt and 6+mo h/o Rt palpable lump. Pt states she has been wearing a sports bra because it is more comfortable.  No skin changes. No nipple bleeding or discharge.  Last mammo - 2014  fam h/o breast cancer  She declines the flu vaccine today.  Past Medical History:  Diagnosis Date  . GERD (gastroesophageal reflux disease)   . Headache(784.0)    migraines  . Hemorrhoids 12/2012  . Hypertension   . Hypothyroidism   . Seasonal allergies   . UTI (urinary tract infection)   . Wears partial dentures    upper and lower    Past Surgical History:  Procedure Laterality Date  . ADENOIDECTOMY  02/17/2005  . CESAREAN SECTION  07/08/2003  . ESOPHAGOGASTRODUODENOSCOPY (EGD) WITH PROPOFOL N/A 06/19/2019   Procedure: ESOPHAGOGASTRODUODENOSCOPY (EGD) WITH PROPOFOL;  Surgeon: Milus Banister, MD;  Location: [facility];  Service: Gastroenterology;  Laterality: N/A;  . EUS N/A 06/19/2019   Procedure: UPPER ENDOSCOPIC ULTRASOUND (EUS) RADIAL;  Surgeon: Milus Banister, MD;  Location: [facility];  Service: Gastroenterology;  Laterality: N/A;  . EVALUATION UNDER ANESTHESIA WITH HEMORRHOIDECTOMY N/A 01/14/2013   Procedure: EXAM UNDER ANESTHESIA WITH HEMORRHOIDECTOMY POSSIBLE BANDING;  Surgeon: Madilyn Hook, DO;  Location: [facility];  Service: General;  Laterality: N/A;  . fibroids removed    . LAPAROSCOPIC TOTAL HYSTERECTOMY  10/29/2006  . MYOMECTOMY  12/18/2001   x 4  . NASAL TURBINATE REDUCTION Bilateral 02/07/2005   inferior and removed adenoids  . TONSILLECTOMY AND ADENOIDECTOMY  age 21  . TUBAL LIGATION  07/08/2003    Social History   Socioeconomic History  . Marital status: Divorced    Spouse name: Not on file  .  Number of children: 3  . Years of education: Not on file  . Highest education level: Not on file  Occupational History  . Occupation: clerk  Tobacco Use  . Smoking status: Never Smoker  . Smokeless tobacco: Never Used  Vaping Use  . Vaping Use: Never used  Substance and Sexual Activity  . Alcohol use: Not Currently    Alcohol/week: 0.0 standard drinks  . Drug use: No  . Sexual activity: Not on file  Other Topics Concern  . Not on file  Social History Narrative  . Not on file   Social Determinants of Health   Financial Resource Strain:   . Difficulty of Paying Living Expenses: Not on file  Food Insecurity:   . Worried About Charity fundraiser in the Last Year: Not on file  . Ran Out of Food in the Last Year: Not on file  Transportation Needs:   . Lack of Transportation (Medical): Not on file  . Lack of Transportation (Non-Medical): Not on file  Physical Activity:   . Days of Exercise per Week: Not on file  . Minutes of Exercise per Session: Not on file  Stress:   . Feeling of Stress : Not on file  Social Connections:   . Frequency of Communication with Friends and Family: Not on file  . Frequency of Social Gatherings with Friends and Family: Not on file  . Attends Religious Services: Not on file  . Active Member  of Clubs or Organizations: Not on file  . Attends Archivist Meetings: Not on file  . Marital Status: Not on file  Intimate Partner Violence:   . Fear of Current or Ex-Partner: Not on file  . Emotionally Abused: Not on file  . Physically Abused: Not on file  . Sexually Abused: Not on file    Family History  Problem Relation Age of Onset  . Diabetes Mother   . Fibroids Mother        uterine   . Pancreatic cancer Father   . Breast cancer Maternal Grandmother   . Clotting disorder Maternal Grandmother   . Diabetes Maternal Grandmother   . Liver cancer Maternal Grandfather   . Diabetes Maternal Grandfather   . Pancreatic cancer Paternal  Grandmother   . Diabetes Paternal Grandmother   . Stomach cancer Paternal Grandfather   . Diabetes Paternal Grandfather   . Colon cancer Paternal Uncle   . Esophageal cancer Neg Hx      Immunization History  Administered Date(s) Administered  . DTaP 08/07/1970, 11/17/1971  . MMR 06/30/1971, 07/06/1972  . PFIZER SARS-COV-2 Vaccination 02/24/2020, 03/26/2020  . Td 05/05/1988, 07/06/2011  . Tdap 07/29/2019  . Varicella 12/21/1975    Outpatient Encounter Medications as of 07/28/2020  Medication Sig Note  . albuterol (PROVENTIL HFA;VENTOLIN HFA) 108 (90 Base) MCG/ACT inhaler Inhale 2 puffs into the lungs every 4 (four) hours as needed for wheezing or shortness of breath.   Marland Kitchen amLODipine (NORVASC) 10 MG tablet TAKE 1 TABLET BY MOUTH DAILY   . cetirizine (ZYRTEC) 10 MG tablet TAKE 1 TABLET BY MOUTH EVERY DAY   . EPINEPHrine (EPIPEN 2-PAK) 0.3 mg/0.3 mL IJ SOAJ injection Inject 0.3 mLs (0.3 mg total) into the muscle as needed (allergic reaction).   . fluticasone (FLONASE) 50 MCG/ACT nasal spray SHAKE LIQUID AND USE 2 SPRAYS IN EACH NOSTRIL DAILY   . fluticasone (FLONASE) 50 MCG/ACT nasal spray SHAKE LIQUID AND USE 2 SPRAYS IN EACH NOSTRIL DAILY   . furosemide (LASIX) 20 MG tablet TAKE 1 TABLET BY MOUTH DAILY AS NEEDED   . levothyroxine (SYNTHROID, LEVOTHROID) 150 MCG tablet Take 175 mcg by mouth daily before breakfast.  06/16/2019: NAME BRAND   . nabumetone (RELAFEN) 750 MG tablet TAKE 1 TABLET(750 MG) BY MOUTH TWICE DAILY AS NEEDED   . naproxen (NAPROSYN) 375 MG tablet Take 1 tablet (375 mg total) by mouth 2 (two) times daily.   Marland Kitchen omeprazole (PRILOSEC) 40 MG capsule TAKE 1 CAPSULE(40 MG) BY MOUTH TWICE DAILY   . baclofen (LIORESAL) 10 MG tablet Take 0.5-1 tablets (5-10 mg total) by mouth 3 (three) times daily as needed for muscle spasms. (Patient not taking: Reported on 07/28/2020)   . HYDROcodone-acetaminophen (NORCO) 5-325 MG tablet Take 1 tablet by mouth every 6 (six) hours as needed for  moderate pain. (Patient not taking: Reported on 07/28/2020)   . lidocaine (LIDODERM) 5 % Place 1 patch onto the skin daily. Remove & Discard patch within 12 hours or as directed by MD (Patient not taking: Reported on 07/28/2020)   . methocarbamol (ROBAXIN) 500 MG tablet Take 1 tablet (500 mg total) by mouth 2 (two) times daily as needed. (Patient not taking: Reported on 07/28/2020)   . ondansetron (ZOFRAN) 4 MG tablet Take 1 tablet (4 mg total) by mouth every 8 (eight) hours as needed for nausea or vomiting. (Patient not taking: Reported on 07/28/2020)   . oxyCODONE-acetaminophen (PERCOCET) 5-325 MG tablet Take 1-2 tablets by mouth 3 (  three) times daily as needed for severe pain. (Patient not taking: Reported on 07/28/2020)    Facility-Administered Encounter Medications as of 07/28/2020  Medication  . 0.9 %  sodium chloride infusion     ROS: Pertinent positives and negatives noted in HPI. Remainder of ROS non-contributory   Allergies  Allergen Reactions  . Ciprofloxacin Anaphylaxis  . Quinolones Anaphylaxis  . Bee Venom Swelling and Rash    BP 118/82   Pulse 81   Temp (!) 97.4 F (36.3 C) (Temporal)   Ht 5' 6"  (1.676 m)   Wt 225 lb 9.6 oz (102.3 kg)   SpO2 97%   BMI 36.41 kg/m   Physical Exam Chest:     Breasts:        Right: Mass and tenderness present. No swelling, bleeding, inverted nipple, nipple discharge or skin change.        Left: Tenderness present. No swelling, bleeding, inverted nipple, mass, nipple discharge or skin change.       Comments: Diffuse Rt breast TTP greatest in upper outer quadrant and around 5 o'clock position. Lt breast with TTP inferiorly Lymphadenopathy:     Upper Body:     Right upper body: No supraclavicular or axillary adenopathy.     Left upper body: No supraclavicular or axillary adenopathy.      A/P:  1. Bilateral mastodynia - Rt > Lt x 1 mo - [facility] Digital Diagnostic Bilat; Future  2. Breast lump on right side at 5 o'clock position -  present x ? months - [place] Unlisted Procedure Breast; Future - [facility] Digital Diagnostic Bilat; Future - fam h/o breast cancer  3. Influenza vaccination declined by patient    This visit occurred during the SARS-CoV-2 public health emergency.  Safety protocols were in place, including screening questions prior to the visit, additional usage of staff PPE, and extensive cleaning of exam room while observing appropriate contact time as indicated for disinfecting solutions.

## 2020-08-02 ENCOUNTER — Other Ambulatory Visit: Payer: Self-pay | Admitting: Family Medicine

## 2020-08-03 ENCOUNTER — Other Ambulatory Visit: Payer: Self-pay | Admitting: Family Medicine

## 2020-08-03 DIAGNOSIS — N644 Mastodynia: Secondary | ICD-10-CM

## 2020-08-16 ENCOUNTER — Other Ambulatory Visit: Payer: Self-pay

## 2020-08-16 ENCOUNTER — Ambulatory Visit
Admission: RE | Admit: 2020-08-16 | Discharge: 2020-08-16 | Disposition: A | Discharge status: Home / Self Care | Payer: Federal, State, Local not specified - PPO | Source: Ambulatory Visit | Attending: Family Medicine | Admitting: Family Medicine

## 2020-08-16 ENCOUNTER — Ambulatory Visit: Payer: Federal, State, Local not specified - PPO

## 2020-08-16 DIAGNOSIS — N644 Mastodynia: Secondary | ICD-10-CM

## 2020-08-16 DIAGNOSIS — N6314 Unspecified lump in the right breast, lower inner quadrant: Secondary | ICD-10-CM

## 2020-09-05 ENCOUNTER — Emergency Department (HOSPITAL_COMMUNITY): Payer: Federal, State, Local not specified - PPO

## 2020-09-05 ENCOUNTER — Emergency Department (HOSPITAL_COMMUNITY)
Admission: EM | Admit: 2020-09-05 | Discharge: 2020-09-05 | Disposition: A | Discharge status: Home / Self Care | Payer: Federal, State, Local not specified - PPO | Attending: Emergency Medicine | Admitting: Emergency Medicine

## 2020-09-05 ENCOUNTER — Other Ambulatory Visit: Payer: Self-pay

## 2020-09-05 ENCOUNTER — Encounter (HOSPITAL_COMMUNITY): Payer: Self-pay | Admitting: Emergency Medicine

## 2020-09-05 DIAGNOSIS — R0981 Nasal congestion: Secondary | ICD-10-CM | POA: Insufficient documentation

## 2020-09-05 DIAGNOSIS — Z20822 Contact with and (suspected) exposure to covid-19: Secondary | ICD-10-CM | POA: Insufficient documentation

## 2020-09-05 DIAGNOSIS — I10 Essential (primary) hypertension: Secondary | ICD-10-CM | POA: Diagnosis not present

## 2020-09-05 DIAGNOSIS — K910 Vomiting following gastrointestinal surgery: Secondary | ICD-10-CM | POA: Diagnosis not present

## 2020-09-05 DIAGNOSIS — R1110 Vomiting, unspecified: Secondary | ICD-10-CM

## 2020-09-05 DIAGNOSIS — R059 Cough, unspecified: Secondary | ICD-10-CM | POA: Diagnosis not present

## 2020-09-05 DIAGNOSIS — E039 Hypothyroidism, unspecified: Secondary | ICD-10-CM | POA: Insufficient documentation

## 2020-09-05 DIAGNOSIS — Z79899 Other long term (current) drug therapy: Secondary | ICD-10-CM | POA: Insufficient documentation

## 2020-09-05 DIAGNOSIS — R0602 Shortness of breath: Secondary | ICD-10-CM | POA: Diagnosis not present

## 2020-09-05 LAB — RESPIRATORY PANEL BY RT PCR (FLU A&B, COVID)
Influenza A by PCR: NEGATIVE
Influenza B by PCR: NEGATIVE
SARS Coronavirus 2 by RT PCR: NEGATIVE

## 2020-09-05 MED ORDER — ACETAMINOPHEN 500 MG PO TABS
1000.0000 mg | ORAL_TABLET | Freq: Once | ORAL | Status: AC
  Administered 2020-09-05: 1000 mg via ORAL
  Filled 2020-09-05: qty 2

## 2020-09-05 MED ORDER — LIDOCAINE VISCOUS HCL 2 % MT SOLN
15.0000 mL | Freq: Once | OROMUCOSAL | Status: AC
  Administered 2020-09-05: 15 mL via ORAL
  Filled 2020-09-05: qty 15

## 2020-09-05 MED ORDER — ALUM & MAG HYDROXIDE-SIMETH 200-200-20 MG/5ML PO SUSP
30.0000 mL | Freq: Once | ORAL | Status: AC
  Administered 2020-09-05: 30 mL via ORAL
  Filled 2020-09-05: qty 30

## 2020-09-05 MED ORDER — ONDANSETRON 4 MG PO TBDP: 4.0000 mg | ORAL_TABLET | Freq: Three times a day (TID) | ORAL | 0 refills | Status: AC | PRN

## 2020-09-05 MED ORDER — ONDANSETRON 4 MG PO TBDP
4.0000 mg | ORAL_TABLET | Freq: Once | ORAL | Status: AC
  Administered 2020-09-05: 4 mg via ORAL
  Filled 2020-09-05: qty 1

## 2020-09-05 NOTE — ED Triage Notes (Signed)
Pt reports having cough for the last week along with loss of taste and shortness of breath. Pt does report exposure to COVID.

## 2020-09-05 NOTE — ED Provider Notes (Signed)
[facility] DEPT Provider Note  CSN: [id number] Arrival date & time: 09/05/20 0133  Chief Complaint(s) Cough and Emesis  HPI Melanie Roberts is a 49 y.o. female   The history is provided by the Melanie Roberts.  Cough Cough characteristics:  Productive Sputum characteristics:  White Severity:  Moderate Onset quality:  Gradual Duration:  1 week Timing:  Intermittent Progression:  Waxing and waning Chronicity:  New Smoker: no   Context: weather changes   Context comment:  Reports that there have been coworkers with COVID, not sure if Melanie Roberts's been on contact with them Relieved by:  Nothing Worsened by:  Nothing Associated symptoms: chest pain (only with coughing), rhinorrhea and sinus congestion   Associated symptoms: no chills, no diaphoresis, no eye discharge, no fever, no myalgias, no shortness of breath and no wheezing     Past Medical History Past Medical History:  Diagnosis Date  . GERD (gastroesophageal reflux disease)   . Headache(784.0)    migraines  . Hemorrhoids 12/2012  . Hypertension   . Hypothyroidism   . Seasonal allergies   . UTI (urinary tract infection)   . Wears partial dentures    upper and lower   Melanie Roberts Active Problem List   Diagnosis Date Noted  . Pain in right elbow 11/13/2019  . Impingement syndrome of right shoulder 10/29/2019  . Nontraumatic incomplete tear of right rotator cuff 10/29/2019  . Chronic right shoulder pain 10/10/2019  . Gastric nodule   . Cough 12/03/2018  . Primary hypothyroidism 01/26/2017  . Hypercholesterolemia 01/26/2017  . Acute bronchitis 12/21/2016  . Allergic reaction   . Anaphylaxis 05/14/2016  . Idiopathic urticaria 09/29/2015   Home Medication(s) Prior to Admission medications   Medication Sig Start Date End Date Taking? Authorizing Provider  albuterol (PROVENTIL HFA;VENTOLIN HFA) 108 (90 Base) MCG/ACT inhaler Inhale 2 puffs into the lungs every 4 (four) hours as needed for  wheezing or shortness of breath. 06/13/17   Pollina, Gwenyth Allegra, MD  amLODipine (NORVASC) 10 MG tablet TAKE 1 TABLET BY MOUTH DAILY 09/25/19   Cirigliano, Mary K, DO  baclofen (LIORESAL) 10 MG tablet Take 0.5-1 tablets (5-10 mg total) by mouth 3 (three) times daily as needed for muscle spasms. Melanie Roberts not taking: Reported on 07/28/2020 10/21/19   Hilts, Legrand Como, MD  cetirizine (ZYRTEC) 10 MG tablet TAKE 1 TABLET BY MOUTH EVERY DAY 04/22/20   Cirigliano, Mary K, DO  EPINEPHrine (EPIPEN 2-PAK) 0.3 mg/0.3 mL IJ SOAJ injection Inject 0.3 mLs (0.3 mg total) into the muscle as needed (allergic reaction). 05/15/16   Whiteheart, Cristal Ford, NP  fluticasone (FLONASE) 50 MCG/ACT nasal spray SHAKE LIQUID AND USE 2 SPRAYS IN EACH NOSTRIL DAILY 04/22/20   Cirigliano, Mary K, DO  fluticasone (FLONASE) 50 MCG/ACT nasal spray SHAKE LIQUID AND USE 2 SPRAYS IN EACH NOSTRIL DAILY 04/22/20   Cirigliano, Mary K, DO  furosemide (LASIX) 20 MG tablet TAKE 1 TABLET BY MOUTH DAILY AS NEEDED 01/26/20   Cirigliano, Garvin Fila, DO  HYDROcodone-acetaminophen (NORCO) 5-325 MG tablet Take 1 tablet by mouth every 6 (six) hours as needed for moderate pain. Melanie Roberts not taking: Reported on 07/28/2020 10/08/19   Ronnald Nian, DO  levothyroxine (SYNTHROID, LEVOTHROID) 150 MCG tablet Take 175 mcg by mouth daily before breakfast.     [provider]  lidocaine (LIDODERM) 5 % Place 1 patch onto the skin daily. Remove & Discard patch within 12 hours or as directed by MD Melanie Roberts not taking: Reported on 07/28/2020 07/29/19  Palumbo, April, MD  methocarbamol (ROBAXIN) 500 MG tablet Take 1 tablet (500 mg total) by mouth 2 (two) times daily as needed. Melanie Roberts not taking: Reported on 07/28/2020 11/06/19   Aundra Dubin, PA-C  nabumetone (RELAFEN) 750 MG tablet TAKE 1 TABLET(750 MG) BY MOUTH TWICE DAILY AS NEEDED 05/27/20   Hilts, Legrand Como, MD  naproxen (NAPROSYN) 375 MG tablet Take 1 tablet (375 mg total) by mouth 2 (two) times daily. 07/29/19    Palumbo, April, MD  omeprazole (PRILOSEC) 40 MG capsule TAKE 1 CAPSULE(40 MG) BY MOUTH TWICE DAILY 07/22/20   Cirigliano, Mary K, DO  ondansetron (ZOFRAN ODT) 4 MG disintegrating tablet Take 1 tablet (4 mg total) by mouth every 8 (eight) hours as needed for up to 3 days for nausea or vomiting. 09/05/20 09/08/20  Fatima Blank, MD  ondansetron (ZOFRAN) 4 MG tablet Take 1 tablet (4 mg total) by mouth every 8 (eight) hours as needed for nausea or vomiting. Melanie Roberts not taking: Reported on 07/28/2020 11/06/19   Aundra Dubin, PA-C  oxyCODONE-acetaminophen (PERCOCET) 5-325 MG tablet Take 1-2 tablets by mouth 3 (three) times daily as needed for severe pain. Melanie Roberts not taking: Reported on 07/28/2020 11/06/19   Nathaniel Man                                                                                                                                    Past Surgical History Past Surgical History:  Procedure Laterality Date  . ADENOIDECTOMY  02/17/2005  . CESAREAN SECTION  07/08/2003  . ESOPHAGOGASTRODUODENOSCOPY (EGD) WITH PROPOFOL N/A 06/19/2019   Procedure: ESOPHAGOGASTRODUODENOSCOPY (EGD) WITH PROPOFOL;  Surgeon: Milus Banister, MD;  Location: [facility];  Service: Gastroenterology;  Laterality: N/A;  . EUS N/A 06/19/2019   Procedure: UPPER ENDOSCOPIC ULTRASOUND (EUS) RADIAL;  Surgeon: Milus Banister, MD;  Location: [facility];  Service: Gastroenterology;  Laterality: N/A;  . EVALUATION UNDER ANESTHESIA WITH HEMORRHOIDECTOMY N/A 01/14/2013   Procedure: EXAM UNDER ANESTHESIA WITH HEMORRHOIDECTOMY POSSIBLE BANDING;  Surgeon: Madilyn Hook, DO;  Location: [facility];  Service: General;  Laterality: N/A;  . fibroids removed    . LAPAROSCOPIC TOTAL HYSTERECTOMY  10/29/2006  . MYOMECTOMY  12/18/2001   x 4  . NASAL TURBINATE REDUCTION Bilateral 02/07/2005   inferior and removed adenoids  . TONSILLECTOMY AND ADENOIDECTOMY  age 45  . TUBAL LIGATION  07/08/2003   Family  History Family History  Problem Relation Age of Onset  . Diabetes Mother   . Fibroids Mother        uterine   . Pancreatic cancer Father   . Clotting disorder Maternal Grandmother   . Diabetes Maternal Grandmother   . Liver cancer Maternal Grandfather   . Diabetes Maternal Grandfather   . Pancreatic cancer Paternal Grandmother   . Diabetes Paternal Grandmother   . Stomach cancer Paternal Grandfather   . Diabetes Paternal Grandfather   .  Colon cancer Paternal Uncle   . Breast cancer Maternal Aunt   . Breast cancer Cousin   . Esophageal cancer Neg Hx     Social History Social History   Tobacco Use  . Smoking status: Never Smoker  . Smokeless tobacco: Never Used  Vaping Use  . Vaping Use: Never used  Substance Use Topics  . Alcohol use: Not Currently    Alcohol/week: 0.0 standard drinks  . Drug use: No   Allergies Ciprofloxacin, Quinolones, and Bee venom  Review of Systems Review of Systems  Constitutional: Negative for chills, diaphoresis and fever.  HENT: Positive for rhinorrhea.   Eyes: Negative for discharge.  Respiratory: Positive for cough. Negative for shortness of breath and wheezing.   Cardiovascular: Positive for chest pain (only with coughing).  Musculoskeletal: Negative for myalgias.   All other systems are reviewed and are negative for acute change except as noted in the HPI  Physical Exam Vital Signs  I have reviewed the triage vital signs BP (!) 125/96   Pulse 72   Temp 98.3 F (36.8 C) (Oral)   Resp 16   Ht 5\' 7"  (1.702 m)   Wt 103.4 kg   SpO2 97%   BMI 35.71 kg/m   Physical Exam Vitals reviewed.  Constitutional:      General: Melanie Roberts is not in acute distress.    Appearance: Melanie Roberts is well-developed. Melanie Roberts is not diaphoretic.  HENT:     Head: Normocephalic and atraumatic.     Nose: Mucosal edema and congestion present.     Comments: Post nasal drip    Mouth/Throat:     Tongue: No lesions.     Palate: No lesions.     Pharynx: Oropharynx is  clear. No pharyngeal swelling, oropharyngeal exudate or posterior oropharyngeal erythema.  Eyes:     General: No scleral icterus.       Right eye: No discharge.        Left eye: No discharge.     Conjunctiva/sclera: Conjunctivae normal.     Pupils: Pupils are equal, round, and reactive to light.  Cardiovascular:     Rate and Rhythm: Normal rate and regular rhythm.     Heart sounds: No murmur heard.  No friction rub. No gallop.   Pulmonary:     Effort: Pulmonary effort is normal. No respiratory distress.     Breath sounds: Normal breath sounds. No stridor. No rales.  Abdominal:     General: There is no distension.     Palpations: Abdomen is soft.     Tenderness: There is no abdominal tenderness.  Musculoskeletal:        General: No tenderness.     Cervical back: Normal range of motion and neck supple.  Skin:    General: Skin is warm and dry.     Findings: No erythema or rash.  Neurological:     Mental Status: Melanie Roberts is alert and oriented to person, place, and time.     ED Results and Treatments Labs (all labs ordered are listed, but only abnormal results are displayed) Labs Reviewed  RESPIRATORY PANEL BY RT PCR (FLU A&B, COVID)  EKG  EKG Interpretation  Date/Time:    Ventricular Rate:    PR Interval:    QRS Duration:   QT Interval:    QTC Calculation:   R Axis:     Text Interpretation:        Radiology CLINICAL DATA: Shortness of breath  EXAM: CHEST - 2 VIEW  COMPARISON: 07/29/2019  FINDINGS: The heart size and mediastinal contours are within normal limits. Both lungs are clear. The visualized skeletal structures are unremarkable.  IMPRESSION: No active cardiopulmonary disease.   Electronically Signed By: Ulyses Jarred M.D. On: 09/05/2020 03:22  Pertinent labs & imaging results that were available during my care of the Melanie Roberts were  reviewed by me and considered in my medical decision making (see chart for details).  Medications Ordered in ED Medications  ondansetron (ZOFRAN-ODT) disintegrating tablet 4 mg (4 mg Oral Given 09/05/20 0314)  alum & mag hydroxide-simeth (MAALOX/MYLANTA) 200-200-20 MG/5ML suspension 30 mL (30 mLs Oral Given 09/05/20 0314)    And  lidocaine (XYLOCAINE) 2 % viscous mouth solution 15 mL (15 mLs Oral Given 09/05/20 0314)  acetaminophen (TYLENOL) tablet 1,000 mg (1,000 mg Oral Given 09/05/20 0313)                                                                                                                                    Procedures Procedures  (including critical care time)  Medical Decision Making / ED Course I have reviewed the nursing notes for this encounter and the Melanie Roberts's prior records (if available in EHR or on provided paperwork).   Kyndel Egger was evaluated in Emergency Department on 09/05/2020 for the symptoms described in the history of present illness. Melanie Roberts was evaluated in the context of the global COVID-19 pandemic, which necessitated consideration that the Melanie Roberts might be at risk for infection with the SARS-CoV-2 virus that causes COVID-19. Institutional protocols and algorithms that pertain to the evaluation of patients at risk for COVID-19 are in a state of rapid change based on information released by regulatory bodies including the CDC and federal and state organizations. These policies and algorithms were followed during the Melanie Roberts's care in the ED.  49 y.o. female presents with cough, rhinorrhea, and nasal congestion for several days. adequate oral hydration. Rest of history as above.  Melanie Roberts appears well. No signs of toxicity, Melanie Roberts is interactive and playful. No hypoxia, tachypnea or other signs of respiratory distress. No sign of clinical dehydration. Abd benign. Lung exam clear. Rest of exam as above.  Most consistent with viral upper respiratory  infection vs allergic rhinitis.   CXR clear. COVID/Flu test pending.  Discussed symptomatic treatment with the parents and they will follow closely with their PCP.          Final Clinical Impression(s) / ED Diagnoses Final diagnoses:  Cough  Nasal congestion  Post-tussive emesis    The Melanie Roberts appears reasonably screened and/or stabilized for discharge and  I doubt any other medical condition or other [facility] requiring further screening, evaluation, or treatment in the ED at this time prior to discharge. Safe for discharge with strict return precautions.  Disposition: Discharge  Condition: Good  I have discussed the results, Dx and Tx plan with the Melanie Roberts/family who expressed understanding and agree(s) with the plan. Discharge instructions discussed at length. The Melanie Roberts/family was given strict return precautions who verbalized understanding of the instructions. No further questions at time of discharge.    ED Discharge Orders         Ordered    ondansetron (ZOFRAN ODT) 4 MG disintegrating tablet  Every 8 hours PRN        09/05/20 0348           Follow Up: Ronnald Nian, DO [place] [address] [phone]  Call  To schedule an appointment for close follow up     This chart was dictated using voice recognition software.  Despite best efforts to proofread,  errors can occur which can change the documentation meaning.   Fatima Blank, MD 09/05/20 [phone]

## 2020-09-20 ENCOUNTER — Telehealth: Payer: Self-pay | Admitting: Family Medicine

## 2020-09-20 DIAGNOSIS — K219 Gastro-esophageal reflux disease without esophagitis: Secondary | ICD-10-CM

## 2020-09-20 NOTE — Telephone Encounter (Signed)
Looks like rx was sent today by Dr. Loletha Grayer

## 2020-09-20 NOTE — Telephone Encounter (Signed)
Melanie Roberts please advise in absence of Dr. Loletha Grayer.   Last refill was 07/22/2020 for 3 mo supply but refill request in 06/21/2020 was denied-  Pt came in in 07/2020 but this was not listed in the ov note.

## 2020-10-05 DIAGNOSIS — E559 Vitamin D deficiency, unspecified: Secondary | ICD-10-CM | POA: Insufficient documentation

## 2020-10-05 DIAGNOSIS — E039 Hypothyroidism, unspecified: Secondary | ICD-10-CM | POA: Diagnosis not present

## 2020-10-07 ENCOUNTER — Other Ambulatory Visit: Payer: Self-pay | Admitting: Family Medicine

## 2020-10-07 DIAGNOSIS — I10 Essential (primary) hypertension: Secondary | ICD-10-CM

## 2020-10-08 NOTE — Telephone Encounter (Signed)
Last OV 07/28/20 Last fill 09/25/19  #90/3

## 2020-10-19 ENCOUNTER — Other Ambulatory Visit: Payer: Self-pay | Admitting: Family Medicine

## 2020-10-19 DIAGNOSIS — R059 Cough, unspecified: Secondary | ICD-10-CM

## 2020-10-19 DIAGNOSIS — R600 Localized edema: Secondary | ICD-10-CM

## 2020-10-19 NOTE — Telephone Encounter (Signed)
Last OV 07/28/20 Last fill for Zyrtec 04/22/20  #90/1 Last fill for Furosemide 01/26/20  #90/2

## 2020-10-20 ENCOUNTER — Other Ambulatory Visit: Payer: Self-pay | Admitting: Family Medicine

## 2020-10-20 DIAGNOSIS — K219 Gastro-esophageal reflux disease without esophagitis: Secondary | ICD-10-CM

## 2021-01-03 DIAGNOSIS — E039 Hypothyroidism, unspecified: Secondary | ICD-10-CM | POA: Diagnosis not present

## 2021-01-03 DIAGNOSIS — E559 Vitamin D deficiency, unspecified: Secondary | ICD-10-CM | POA: Diagnosis not present

## 2021-01-04 DIAGNOSIS — E559 Vitamin D deficiency, unspecified: Secondary | ICD-10-CM | POA: Diagnosis not present

## 2021-01-04 DIAGNOSIS — E039 Hypothyroidism, unspecified: Secondary | ICD-10-CM | POA: Diagnosis not present

## 2021-01-15 IMAGING — CR DG KNEE COMPLETE 4+V*R*
4 series · 4 of 4 positions shown · non-contrast
Comparison: Knee radiograph 07/27/2020

CLINICAL DATA: Injury, hyper extended after fall 01/06/2020

EXAM:
RIGHT KNEE - COMPLETE 4+ VIEW

[t knee ap right]
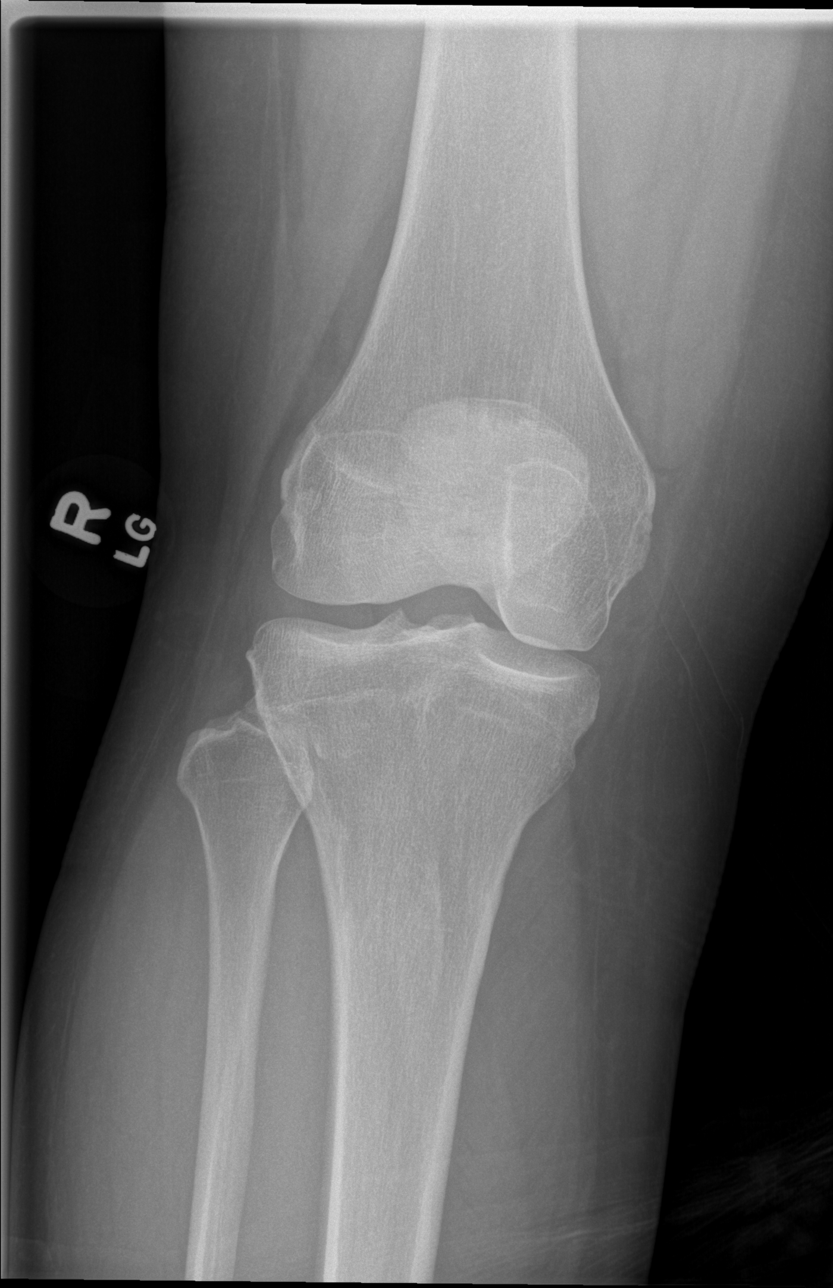

[t knee obl right (1 of 2)]
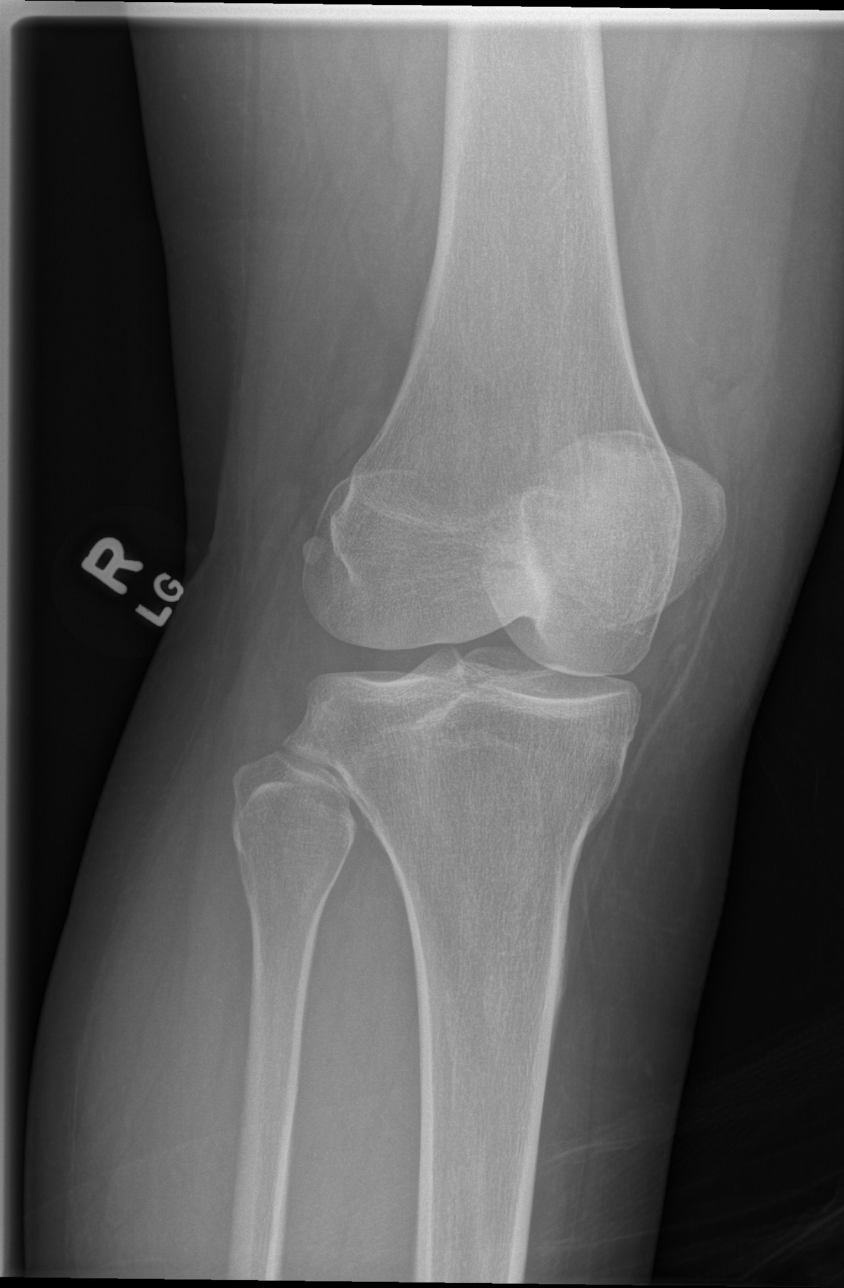

[t knee obl right (2 of 2)]
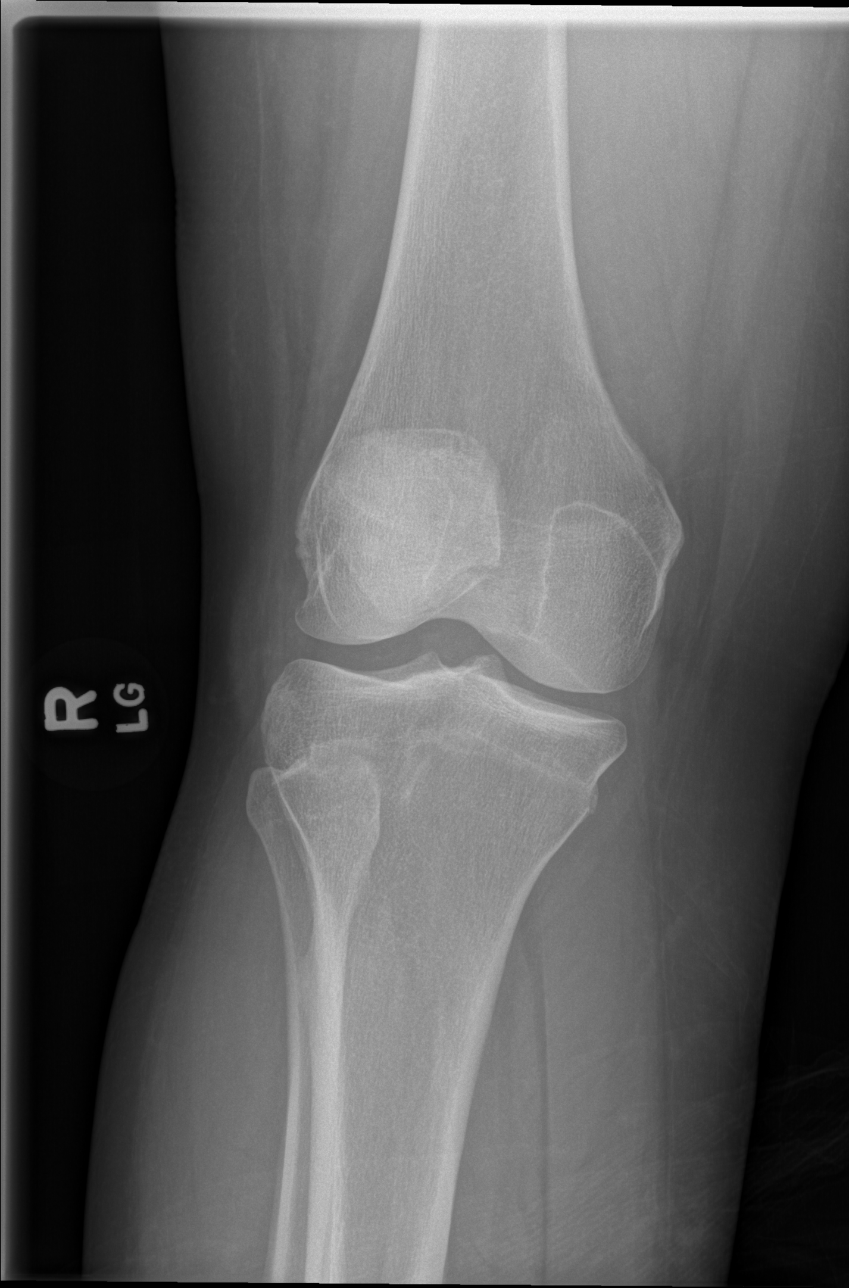

[t knee lat right]
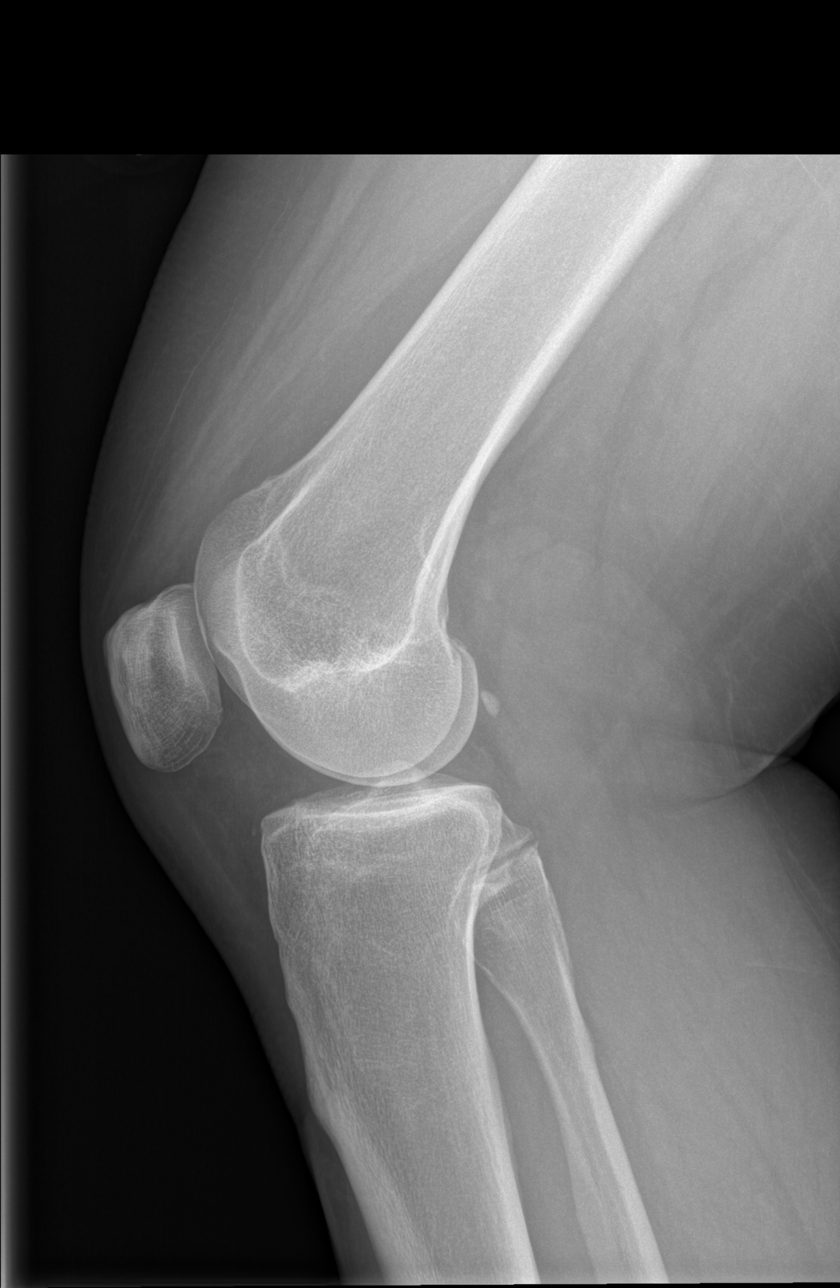

[4 of 4 positions shown; findings below may reference images not displayed]

FINDINGS: A deep lateral femoral notch sign is similar to comparison and may
reflect sequela of prior osteochondral defect and ACL injuries. No
acute bony abnormality. Specifically, no fracture, subluxation, or
dislocation. Soft tissues are unremarkable.
IMPRESSION: 1. No acute bony abnormality.
2. Deep lateral femoral notch sign is similar to comparison and may
reflect sequela of prior osteochondral defect and ACL injuries.

## 2021-02-02 DIAGNOSIS — R1110 Vomiting, unspecified: Secondary | ICD-10-CM | POA: Diagnosis not present

## 2021-02-10 ENCOUNTER — Telehealth: Payer: Self-pay

## 2021-02-11 NOTE — Telephone Encounter (Signed)
Informed pt that her forms are ready and at front desk.

## 2021-03-28 ENCOUNTER — Ambulatory Visit (HOSPITAL_COMMUNITY)
Admission: EM | Admit: 2021-03-28 | Discharge: 2021-03-28 | Disposition: A | Discharge status: Home / Self Care | Payer: Federal, State, Local not specified - PPO

## 2021-03-29 ENCOUNTER — Other Ambulatory Visit: Payer: Self-pay | Admitting: Family Medicine

## 2021-03-29 DIAGNOSIS — K219 Gastro-esophageal reflux disease without esophagitis: Secondary | ICD-10-CM

## 2021-04-04 DIAGNOSIS — E039 Hypothyroidism, unspecified: Secondary | ICD-10-CM | POA: Diagnosis not present

## 2021-04-04 DIAGNOSIS — R7309 Other abnormal glucose: Secondary | ICD-10-CM | POA: Diagnosis not present

## 2021-04-06 DIAGNOSIS — E119 Type 2 diabetes mellitus without complications: Secondary | ICD-10-CM | POA: Insufficient documentation

## 2021-04-06 DIAGNOSIS — E039 Hypothyroidism, unspecified: Secondary | ICD-10-CM | POA: Diagnosis not present

## 2021-04-06 DIAGNOSIS — E559 Vitamin D deficiency, unspecified: Secondary | ICD-10-CM | POA: Diagnosis not present

## 2021-06-13 DIAGNOSIS — U071 COVID-19: Secondary | ICD-10-CM | POA: Diagnosis not present

## 2021-06-13 DIAGNOSIS — R059 Cough, unspecified: Secondary | ICD-10-CM | POA: Diagnosis not present

## 2021-06-29 ENCOUNTER — Telehealth: Payer: Self-pay | Admitting: Family Medicine

## 2021-06-29 DIAGNOSIS — R059 Cough, unspecified: Secondary | ICD-10-CM

## 2021-06-29 DIAGNOSIS — K219 Gastro-esophageal reflux disease without esophagitis: Secondary | ICD-10-CM

## 2021-06-29 MED ORDER — OMEPRAZOLE 40 MG PO CPDR: DELAYED_RELEASE_CAPSULE | ORAL | 0 refills | Status: DC

## 2021-06-29 MED ORDER — CETIRIZINE HCL 10 MG PO TABS: 10.0000 mg | ORAL_TABLET | Freq: Every day | ORAL | 0 refills | Status: AC

## 2021-06-29 NOTE — Telephone Encounter (Signed)
For doc of the day: Pt needs refill on Omeprazole and Cetirizine (Dr C's pt) Has TOC sched with Dr Gena Fray 12/6.

## 2021-06-29 NOTE — Telephone Encounter (Signed)
Refill request for: Omeprazole 40 mg LR [id number], [id number], 0 rf  Certtizine 10 mg LR [id number], #90, 1 rf LOV 07/28/20 (Dr Bryan Lemma) [place] 09/27/21 (Dr Gena Fray)  Please review and advise. Thanks.  Dm/cma

## 2021-07-01 ENCOUNTER — Telehealth: Payer: Self-pay | Admitting: Family Medicine

## 2021-07-01 ENCOUNTER — Other Ambulatory Visit: Payer: Self-pay | Admitting: Family

## 2021-07-01 MED ORDER — ALBUTEROL SULFATE HFA 108 (90 BASE) MCG/ACT IN AERS: 2.0000 | INHALATION_SPRAY | RESPIRATORY_TRACT | 2 refills | Status: DC | PRN

## 2021-07-01 NOTE — Telephone Encounter (Signed)
Refill request for:  Albuterol LR 06/03/2017, #1, 2 rf LOV 07/28/20 (Dr Bryan Lemma) [place]  09/27/21  (Dr Gena Fray)  Please review and advise.   Thanks. Dm/cma

## 2021-07-01 NOTE — Telephone Encounter (Signed)
Pt needs refill for inhaler, TOC with [facility] in Dec. (Dr C's pt)

## 2021-07-04 NOTE — Telephone Encounter (Signed)
Patient notified VIA phone that RX was sent to the pharmacy.  Dm/cma  

## 2021-07-06 DIAGNOSIS — E039 Hypothyroidism, unspecified: Secondary | ICD-10-CM | POA: Diagnosis not present

## 2021-07-08 DIAGNOSIS — E119 Type 2 diabetes mellitus without complications: Secondary | ICD-10-CM | POA: Diagnosis not present

## 2021-07-08 DIAGNOSIS — E039 Hypothyroidism, unspecified: Secondary | ICD-10-CM | POA: Diagnosis not present

## 2021-07-08 DIAGNOSIS — Z7984 Long term (current) use of oral hypoglycemic drugs: Secondary | ICD-10-CM | POA: Diagnosis not present

## 2021-08-15 DIAGNOSIS — E039 Hypothyroidism, unspecified: Secondary | ICD-10-CM | POA: Diagnosis not present

## 2021-08-16 DIAGNOSIS — E119 Type 2 diabetes mellitus without complications: Secondary | ICD-10-CM | POA: Diagnosis not present

## 2021-08-16 DIAGNOSIS — Z79899 Other long term (current) drug therapy: Secondary | ICD-10-CM | POA: Diagnosis not present

## 2021-08-16 DIAGNOSIS — E039 Hypothyroidism, unspecified: Secondary | ICD-10-CM | POA: Diagnosis not present

## 2021-08-16 DIAGNOSIS — Z7984 Long term (current) use of oral hypoglycemic drugs: Secondary | ICD-10-CM | POA: Diagnosis not present

## 2021-08-19 DIAGNOSIS — E041 Nontoxic single thyroid nodule: Secondary | ICD-10-CM | POA: Diagnosis not present

## 2021-09-25 ENCOUNTER — Other Ambulatory Visit: Payer: Self-pay | Admitting: Family

## 2021-09-25 DIAGNOSIS — K219 Gastro-esophageal reflux disease without esophagitis: Secondary | ICD-10-CM

## 2021-09-25 DIAGNOSIS — R059 Cough, unspecified: Secondary | ICD-10-CM

## 2021-09-27 ENCOUNTER — Other Ambulatory Visit: Payer: Self-pay

## 2021-09-27 ENCOUNTER — Ambulatory Visit (INDEPENDENT_AMBULATORY_CARE_PROVIDER_SITE_OTHER): Payer: Federal, State, Local not specified - PPO | Admitting: Family Medicine

## 2021-09-27 ENCOUNTER — Encounter: Payer: Self-pay | Admitting: Family Medicine

## 2021-09-27 VITALS — BP 126/74 | HR 84 | Temp 97.4°F | Ht 67.0 in | Wt 224.0 lb

## 2021-09-27 DIAGNOSIS — I10 Essential (primary) hypertension: Secondary | ICD-10-CM | POA: Diagnosis not present

## 2021-09-27 DIAGNOSIS — E559 Vitamin D deficiency, unspecified: Secondary | ICD-10-CM

## 2021-09-27 DIAGNOSIS — K219 Gastro-esophageal reflux disease without esophagitis: Secondary | ICD-10-CM | POA: Insufficient documentation

## 2021-09-27 DIAGNOSIS — E7800 Pure hypercholesterolemia, unspecified: Secondary | ICD-10-CM | POA: Diagnosis not present

## 2021-09-27 DIAGNOSIS — E119 Type 2 diabetes mellitus without complications: Secondary | ICD-10-CM | POA: Diagnosis not present

## 2021-09-27 DIAGNOSIS — R600 Localized edema: Secondary | ICD-10-CM | POA: Insufficient documentation

## 2021-09-27 DIAGNOSIS — E039 Hypothyroidism, unspecified: Secondary | ICD-10-CM | POA: Diagnosis not present

## 2021-09-27 DIAGNOSIS — E041 Nontoxic single thyroid nodule: Secondary | ICD-10-CM | POA: Insufficient documentation

## 2021-09-27 DIAGNOSIS — Z1211 Encounter for screening for malignant neoplasm of colon: Secondary | ICD-10-CM

## 2021-09-27 DIAGNOSIS — M545 Low back pain, unspecified: Secondary | ICD-10-CM | POA: Insufficient documentation

## 2021-09-27 DIAGNOSIS — Z23 Encounter for immunization: Secondary | ICD-10-CM

## 2021-09-27 DIAGNOSIS — Z1231 Encounter for screening mammogram for malignant neoplasm of breast: Secondary | ICD-10-CM

## 2021-09-27 DIAGNOSIS — M722 Plantar fascial fibromatosis: Secondary | ICD-10-CM | POA: Insufficient documentation

## 2021-09-27 LAB — URINALYSIS, ROUTINE W REFLEX MICROSCOPIC
Bilirubin Urine: NEGATIVE
Hgb urine dipstick: NEGATIVE
Ketones, ur: NEGATIVE
Leukocytes,Ua: NEGATIVE
Nitrite: NEGATIVE
RBC / HPF: NONE SEEN (ref 0–?)
Specific Gravity, Urine: 1.025 (ref 1.000–1.030)
Total Protein, Urine: NEGATIVE
Urine Glucose: NEGATIVE
Urobilinogen, UA: 0.2 (ref 0.0–1.0)
WBC, UA: NONE SEEN (ref 0–?)
pH: 6 (ref 5.0–8.0)

## 2021-09-27 LAB — LIPID PANEL
Cholesterol: 225 mg/dL — ABNORMAL HIGH (ref 0–200)
HDL: 51.7 mg/dL (ref >39.00)
LDL Cholesterol: 140 mg/dL — ABNORMAL HIGH (ref 0–99)
NonHDL: 172.92
Total CHOL/HDL Ratio: 4
Triglycerides: 163 mg/dL — ABNORMAL HIGH (ref 0.0–149.0)
VLDL: 32.6 mg/dL (ref 0.0–40.0)

## 2021-09-27 LAB — BASIC METABOLIC PANEL
BUN: 21 mg/dL (ref 6–23)
CO2: 26 mEq/L (ref 19–32)
Calcium: 10.2 mg/dL (ref 8.4–10.5)
Chloride: 106 mEq/L (ref 96–112)
Creatinine, Ser: 0.77 mg/dL (ref 0.40–1.20)
GFR: 89.35 mL/min (ref >60.00)
Glucose, Bld: 100 mg/dL — ABNORMAL HIGH (ref 70–99)
Potassium: 3.9 mEq/L (ref 3.5–5.1)
Sodium: 138 mEq/L (ref 135–145)

## 2021-09-27 LAB — VITAMIN D 25 HYDROXY (VIT D DEFICIENCY, FRACTURES): VITD: 18.08 ng/mL — ABNORMAL LOW (ref 30.00–100.00)

## 2021-09-27 LAB — MICROALBUMIN / CREATININE URINE RATIO
Creatinine,U: 185.3 mg/dL
Microalb Creat Ratio: 0.7 mg/g (ref 0.0–30.0)
Microalb, Ur: 1.4 mg/dL (ref 0.0–1.9)

## 2021-09-27 LAB — HEMOGLOBIN A1C: Hgb A1c MFr Bld: 6.3 % (ref 4.6–6.5)

## 2021-09-27 MED ORDER — AMLODIPINE BESYLATE 10 MG PO TABS
10.0000 mg | ORAL_TABLET | Freq: Every day | ORAL | 3 refills | Status: DC
Start: 2021-09-27 — End: 2022-09-20

## 2021-09-27 MED ORDER — ATORVASTATIN CALCIUM 20 MG PO TABS: 20.0000 mg | ORAL_TABLET | Freq: Every day | ORAL | 3 refills | Status: DC

## 2021-09-27 NOTE — Progress Notes (Signed)
[facility] [address] [place] [address] Dept: [phone] Dept Fax: [phone]  Transfer of Care Office Visit  Subjective:    Patient ID: Melanie Roberts, female    DOB: June 02, 1971, 50 y.o..   MRN: [id number]  Chief Complaint  Patient presents with   Establish Care    [facility]- establish care and med refills.  No concerns.  Declines flu shot.     History of Present Illness:  Patient is in today to establish care. Melanie Roberts was born in [place], [place]. She moved to [place] in 2002, when her mother ( Melanie Roberts) moved back to the state. She is divorced. She has three children (27, 21, 18). 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 Her aged mother lives just Melanie few houses down from her. She denies any tobacco or drug use. She drinks alcohol socially.  Melanie Roberts has Melanie history of hypothyroidism. She is managed by Ms. [facility] (endocrinology NP) and is currently on Tirosint (levothyroxine) 225 mcg daily. She also had Melanie recent ultrasound that showed Melanie thyroid nodule and is scheduled to have this biopsied.  Melanie Roberts has Melanie history of Type 2 diabetes. She is managed on metformin. She notes she has not had her eye exam this year.  Melanie Roberts has Melanie history of  hypertension and is managed with amlodipine.  Melanie Roberts has had elevated lipids, but is not currently on therapy for this.  Past Medical History: Patient Active Problem List   Diagnosis Date Noted   Thyroid nodule 09/27/2021   Type 2 diabetes mellitus ([place]) 04/06/2021   Vitamin D deficiency 10/05/2020   Pain in right elbow 11/13/2019   Impingement syndrome of right shoulder 10/29/2019   Nontraumatic incomplete tear of right rotator cuff 10/29/2019   Gastric nodule    Primary hypothyroidism 01/26/2017   Hypercholesterolemia 01/26/2017   Anaphylaxis 05/14/2016   Idiopathic urticaria 09/29/2015   Past  Surgical History:  Procedure Laterality Date   ADENOIDECTOMY  02/17/2005   CESAREAN SECTION  07/08/2003   ESOPHAGOGASTRODUODENOSCOPY (EGD) WITH PROPOFOL N/Melanie 06/19/2019   Procedure: ESOPHAGOGASTRODUODENOSCOPY (EGD) WITH PROPOFOL;  Surgeon: Milus Banister, MD;  Location: [facility];  Service: Gastroenterology;  Laterality: N/Melanie;   EUS N/Melanie 06/19/2019   Procedure: UPPER ENDOSCOPIC ULTRASOUND (EUS) RADIAL;  Surgeon: Milus Banister, MD;  [facility];  Service: Gastroenterology;  Laterality: N/Melanie;   EVALUATION UNDER ANESTHESIA WITH HEMORRHOIDECTOMY N/Melanie 01/14/2013   Procedure: EXAM UNDER ANESTHESIA WITH HEMORRHOIDECTOMY POSSIBLE BANDING;  Surgeon: Madilyn Hook, DO;  Location: [facility];  Service: General;  Laterality: N/Melanie;   fibroids removed     LAPAROSCOPIC TOTAL HYSTERECTOMY  10/29/2006   MYOMECTOMY  12/18/2001   x 4   NASAL TURBINATE REDUCTION Bilateral 02/07/2005   inferior and removed adenoids   ROTATOR CUFF REPAIR Right 2020   TONSILLECTOMY AND ADENOIDECTOMY  age 48   TUBAL LIGATION  07/08/2003   Family History  Problem Relation Age of Onset   Diabetes Mother    Fibroids Mother        uterine    Dementia Mother    Pancreatic cancer Father    Cancer Maternal Aunt        Breast   Breast cancer Maternal Aunt    Cancer Maternal Aunt        Liver   Cancer Maternal Aunt        Throat  Heart disease Maternal Uncle        40s   Colon cancer Paternal Uncle    Clotting disorder Maternal Grandmother    Diabetes Maternal Grandmother    Liver cancer Maternal Grandfather    Diabetes Maternal Grandfather    Pancreatic cancer Paternal Grandmother    Diabetes Paternal Grandmother    Stomach cancer Paternal Grandfather    Diabetes Paternal Grandfather    Breast cancer Cousin    Esophageal cancer Neg Hx    Outpatient Medications Prior to Visit  Medication Sig Dispense Refill   albuterol (VENTOLIN HFA) 108 (90 Base) MCG/ACT inhaler Inhale 2 puffs into the  lungs every 4 (four) hours as needed for wheezing or shortness of breath. 1 each 2   cetirizine (ZYRTEC) 10 MG tablet Take 1 tablet (10 mg total) by mouth daily. 90 tablet 0   EPINEPHrine (EPIPEN 2-PAK) 0.3 mg/0.3 mL IJ SOAJ injection Inject 0.3 mLs (0.3 mg total) into the muscle as needed (allergic reaction). 1 Device 1   ergocalciferol (VITAMIN D2) 1.25 MG (50000 UT) capsule Take 1 capsule by mouth every 7 (seven) days.     fluticasone (FLONASE) 50 MCG/ACT nasal spray SHAKE LIQUID AND USE 2 SPRAYS IN EACH NOSTRIL DAILY 16 g 2   furosemide (LASIX) 20 MG tablet TAKE 1 TABLET BY MOUTH DAILY AS NEEDED 90 tablet 2   ibuprofen (ADVIL) 600 MG tablet      Lancets (ONETOUCH DELICA PLUS [id number]) MISC daily. as directed     lidocaine (LIDODERM) 5 % Place 1 patch onto the skin daily. Remove & Discard patch within 12 hours or as directed by MD 30 patch 0   metFORMIN (GLUCOPHAGE-XR) 500 MG 24 hr tablet Take by mouth.     naproxen (NAPROSYN) 375 MG tablet Take 1 tablet (375 mg total) by mouth 2 (two) times daily. 20 tablet 0   omeprazole (PRILOSEC) 40 MG capsule TAKE 1 CAPSULE(40 MG) BY MOUTH TWICE DAILY 180 capsule 0   ondansetron (ZOFRAN) 4 MG tablet Take 1 tablet (4 mg total) by mouth every 8 (eight) hours as needed for nausea or vomiting. 40 tablet 0   ONETOUCH VERIO test strip daily.     TIROSINT 200 MCG CAPS Take by mouth.     TIROSINT 25 MCG CAPS Take 1 capsule by mouth daily.     amLODipine (NORVASC) 10 MG tablet TAKE 1 TABLET BY MOUTH DAILY 90 tablet 3   baclofen (LIORESAL) 10 MG tablet Take 0.5-1 tablets (5-10 mg total) by mouth 3 (three) times daily as needed for muscle spasms. (Patient not taking: Reported on 07/28/2020) 30 each 3   fluticasone (FLONASE) 50 MCG/ACT nasal spray SHAKE LIQUID AND USE 2 SPRAYS IN EACH NOSTRIL DAILY 16 g 2   HYDROcodone-acetaminophen (NORCO) 5-325 MG tablet Take 1 tablet by mouth every 6 (six) hours as needed for moderate pain. (Patient not taking: Reported on  07/28/2020) 30 tablet 0   levothyroxine (SYNTHROID, LEVOTHROID) 150 MCG tablet Take 175 mcg by mouth daily before breakfast.      methocarbamol (ROBAXIN) 500 MG tablet Take 1 tablet (500 mg total) by mouth 2 (two) times daily as needed. (Patient not taking: Reported on 07/28/2020) 20 tablet 0   nabumetone (RELAFEN) 750 MG tablet TAKE 1 TABLET(750 MG) BY MOUTH TWICE DAILY AS NEEDED 60 tablet 6   oxyCODONE-acetaminophen (PERCOCET) 5-325 MG tablet Take 1-2 tablets by mouth 3 (three) times daily as needed for severe pain. 30 tablet 0   0.9 %  sodium chloride infusion      No facility-administered medications prior to visit.   Allergies  Allergen Reactions   Ciprofloxacin Anaphylaxis   Quinolones Anaphylaxis   Bee Venom Swelling and Rash    Objective:   Today's Vitals   09/27/21 1016  BP: 126/74  Pulse: 84  Temp: (!) 97.4 F (36.3 C)  TempSrc: Temporal  SpO2: 96%  Weight: 224 lb (101.6 kg)  Height: 5\' 7"  (1.702 m)   Body mass index is 35.08 kg/m.   General: Well developed, well nourished. No acute distress. Extremities: No edema noted. Psych: Alert and oriented. Normal mood and affect.  Health Maintenance Due  Topic Date Due   Pneumococcal Vaccine 57-24 Years old (1 - PCV) Never done   FOOT EXAM  Never done   OPHTHALMOLOGY EXAM  Never done   URINE MICROALBUMIN  Never done   Hepatitis C Screening  Never done   COLONOSCOPY (Pts 45-26yrs Insurance coverage will need to be confirmed)  Never done   HEMOGLOBIN A1C  03/28/2016   Zoster Vaccines- Shingrix (1 of 2) Never done   COVID-19 Vaccine (3 - Booster for Pfizer series) 05/21/2020   INFLUENZA VACCINE  Never done   Lab Results Last hemoglobin A1c Lab Results  Component Value Date   HGBA1C 5.9 09/28/2015    Ref Range & Units 1 mo ago  TSH 0.45 - 5.00 UIU/ML 3.48    Assessment & Plan:   1. Type 2 diabetes mellitus without complication, without long-term current use of insulin ([place]) Ms. Mellody Drown last A1c was in  excellent control. I will check annual labs. I recommended she follow-up for her annual eye exam. We were out of pneumococcal vaccine today, so will defer this to her next appointment in 3 months.  - Lancets (ONETOUCH DELICA PLUS [id number]) MISC; daily. as directed - ONETOUCH VERIO test strip; daily. - metFORMIN (GLUCOPHAGE-XR) 500 MG 24 hr tablet; Take by mouth. - Microalbumin / creatinine urine ratio - Basic metabolic panel - Hemoglobin A1c - Urinalysis, Routine w reflex microscopic  2. Primary hypothyroidism Recent TSH was in normal rang.e She will continue to follow with endocrinology.  - TIROSINT 25 MCG CAPS; Take 1 capsule by mouth daily. - TIROSINT 200 MCG CAPS; Take by mouth.  3. Thyroid nodule Thyroid biopsy pending.  4. Essential hypertension Blood pressure is in good control. We will continue her on amlodipine for now, but would consider switch to an ACE-I if her renal labs are abnormal.  - amLODipine (NORVASC) 10 MG tablet; Take 1 tablet (10 mg total) by mouth daily.  Dispense: 90 tablet; Refill: 3  5. Hypercholesterolemia Prior lipids abnormal. I will repeat these today and then discuss with her about starting Melanie statin.  - Lipid panel  6. Vitamin D deficiency We will reassess her Vit. D level to determine if shye still needs replacement or can switch to maintenance therapy.  - ergocalciferol (VITAMIN D2) 1.25 MG (50000 UT) capsule; Take 1 capsule by mouth every 7 (seven) days. - VITAMIN D 25 Hydroxy (Vit-D Deficiency, Fractures)  7. Need for shingles vaccine  - Varicella-zoster vaccine IM (Shingrix)  8. Encounter for screening mammogram for malignant neoplasm of breast  - MM DIGITAL SCREENING BILATERAL; Future  9. Screening for colon cancer  - Ambulatory referral to Gastroenterology  Haydee Salter, MD

## 2021-09-27 NOTE — Addendum Note (Signed)
Addended by: Haydee Salter on: 09/27/2021 05:31 PM   Modules accepted: Orders

## 2021-11-03 DIAGNOSIS — E041 Nontoxic single thyroid nodule: Secondary | ICD-10-CM | POA: Diagnosis not present

## 2021-11-14 ENCOUNTER — Ambulatory Visit
Admission: RE | Admit: 2021-11-14 | Discharge: 2021-11-14 | Disposition: A | Discharge status: Home / Self Care | Payer: Federal, State, Local not specified - PPO | Source: Ambulatory Visit | Attending: Family Medicine | Admitting: Family Medicine

## 2021-11-14 DIAGNOSIS — Z1231 Encounter for screening mammogram for malignant neoplasm of breast: Secondary | ICD-10-CM

## 2021-11-15 DIAGNOSIS — E039 Hypothyroidism, unspecified: Secondary | ICD-10-CM | POA: Diagnosis not present

## 2021-11-15 DIAGNOSIS — E1165 Type 2 diabetes mellitus with hyperglycemia: Secondary | ICD-10-CM | POA: Diagnosis not present

## 2021-11-16 DIAGNOSIS — E119 Type 2 diabetes mellitus without complications: Secondary | ICD-10-CM | POA: Diagnosis not present

## 2021-11-16 DIAGNOSIS — Z7984 Long term (current) use of oral hypoglycemic drugs: Secondary | ICD-10-CM | POA: Diagnosis not present

## 2021-11-16 DIAGNOSIS — Z79890 Hormone replacement therapy (postmenopausal): Secondary | ICD-10-CM | POA: Diagnosis not present

## 2021-11-16 DIAGNOSIS — E039 Hypothyroidism, unspecified: Secondary | ICD-10-CM | POA: Diagnosis not present

## 2021-12-26 ENCOUNTER — Other Ambulatory Visit: Payer: Self-pay

## 2021-12-28 ENCOUNTER — Other Ambulatory Visit: Payer: Self-pay

## 2021-12-28 ENCOUNTER — Ambulatory Visit: Payer: Federal, State, Local not specified - PPO | Admitting: Family Medicine

## 2021-12-28 VITALS — BP 122/74 | HR 78 | Temp 97.8°F | Ht 67.0 in | Wt 226.0 lb

## 2021-12-28 DIAGNOSIS — E039 Hypothyroidism, unspecified: Secondary | ICD-10-CM | POA: Diagnosis not present

## 2021-12-28 DIAGNOSIS — I10 Essential (primary) hypertension: Secondary | ICD-10-CM | POA: Diagnosis not present

## 2021-12-28 DIAGNOSIS — Z23 Encounter for immunization: Secondary | ICD-10-CM

## 2021-12-28 DIAGNOSIS — Z1159 Encounter for screening for other viral diseases: Secondary | ICD-10-CM | POA: Diagnosis not present

## 2021-12-28 DIAGNOSIS — E119 Type 2 diabetes mellitus without complications: Secondary | ICD-10-CM | POA: Diagnosis not present

## 2021-12-28 DIAGNOSIS — N951 Menopausal and female climacteric states: Secondary | ICD-10-CM

## 2021-12-28 DIAGNOSIS — E782 Mixed hyperlipidemia: Secondary | ICD-10-CM | POA: Diagnosis not present

## 2021-12-28 LAB — LIPID PANEL
Cholesterol: 179 mg/dL (ref 0–200)
HDL: 54.8 mg/dL (ref >39.00)
LDL Cholesterol: 99 mg/dL (ref 0–99)
NonHDL: 124.2
Total CHOL/HDL Ratio: 3
Triglycerides: 128 mg/dL (ref 0.0–149.0)
VLDL: 25.6 mg/dL (ref 0.0–40.0)

## 2021-12-28 MED ORDER — ESTRADIOL 0.5 MG PO TABS: 0.5000 mg | ORAL_TABLET | Freq: Every day | ORAL | 3 refills | Status: DC

## 2021-12-28 NOTE — Patient Instructions (Signed)

## 2021-12-28 NOTE — Progress Notes (Signed)
[facility] [address] [place] [address] Dept: [phone] Dept Fax: [phone]  Chronic Care Office Visit  Subjective:    Patient ID: Melanie Roberts, female    DOB: Aug 22, 1971, 51 y.o..   MRN: [id number]  Chief Complaint  Patient presents with   Follow-up    3 month f/u.  Fasting today.  C/o having night sweats and swelling in both feet.      History of Present Illness:  Patient is in today for reassessment of chronic medical issues.  Ms. Melanie Roberts has a history of hypothyroidism. She is managed by Ms. [facility] (endocrinology NP) and is currently on Tirosint (levothyroxine) 225 mcg daily. She had not been taking her medication consistently, so her last TSH was high. She underwent FNA of her thyroid nodules. She notes that not enough tissue was sampled, so she will have this repeated.  Ms. Melanie Roberts has a history of Type 2 diabetes. She is managed on metformin. She notes she has still not had her eye exam.   Ms. Melanie Roberts has a history of  hypertension and is managed with amlodipine.   Ms. Melanie Roberts has had elevated lipids. After her last visit, we started her on atorvastatin.   Ms. Melanie Roberts has a history of pedal edema. She takes furosemide for management of this.  Ms. Melanie Roberts notes she is struggling with intermittent episodes of hot flashes. This is esp. bothersome at night. She had a hysterectomy some years ago, but her ovaries were left in place. The hot flashes are interfering with her sleep.  Past Medical History: Patient Active Problem List   Diagnosis Date Noted   Thyroid nodule 09/27/2021   Gastroesophageal reflux disease 09/27/2021   Low back pain 09/27/2021   Pedal edema 09/27/2021   Plantar fasciitis 09/27/2021   Essential hypertension 09/27/2021   Type 2 diabetes mellitus ([place]) 04/06/2021   Vitamin D deficiency 10/05/2020   Pain in right elbow 11/13/2019   Impingement syndrome of right shoulder  10/29/2019   Nontraumatic incomplete tear of right rotator cuff 10/29/2019   Gastric nodule    Primary hypothyroidism 01/26/2017   Hyperlipidemia 01/26/2017   Bee sting allergy 05/14/2016   Idiopathic urticaria 09/29/2015   Past Surgical History:  Procedure Laterality Date   ADENOIDECTOMY  02/17/2005   CESAREAN SECTION  07/08/2003   ESOPHAGOGASTRODUODENOSCOPY (EGD) WITH PROPOFOL N/A 06/19/2019   Procedure: ESOPHAGOGASTRODUODENOSCOPY (EGD) WITH PROPOFOL;  Surgeon: Milus Banister, MD;  Location: [facility];  Service: Gastroenterology;  Laterality: N/A;   EUS N/A 06/19/2019   Procedure: UPPER ENDOSCOPIC ULTRASOUND (EUS) RADIAL;  Surgeon: Milus Banister, MD;  [facility];  Service: Gastroenterology;  Laterality: N/A;   EVALUATION UNDER ANESTHESIA WITH HEMORRHOIDECTOMY N/A 01/14/2013   Procedure: EXAM UNDER ANESTHESIA WITH HEMORRHOIDECTOMY POSSIBLE BANDING;  Surgeon: Madilyn Hook, DO;  Location: [facility];  Service: General;  Laterality: N/A;   fibroids removed     LAPAROSCOPIC TOTAL HYSTERECTOMY  10/29/2006   MYOMECTOMY  12/18/2001   x 4   NASAL TURBINATE REDUCTION Bilateral 02/07/2005   inferior and removed adenoids   ROTATOR CUFF REPAIR Right 2020   TONSILLECTOMY AND ADENOIDECTOMY  age 30   TUBAL LIGATION  07/08/2003   Family History  Problem Relation Age of Onset   Diabetes Mother    Fibroids Mother        uterine    Dementia Mother    Pancreatic cancer Father    Cancer Maternal Aunt  Breast   Breast cancer Maternal Aunt        late 65s   Cancer Maternal Aunt        Liver   Cancer Maternal Aunt        Throat   Heart disease Maternal Uncle        12s   Colon cancer Paternal Uncle    Clotting disorder Maternal Grandmother    Diabetes Maternal Grandmother    Liver cancer Maternal Grandfather    Diabetes Maternal Grandfather    Pancreatic cancer Paternal Grandmother    Diabetes Paternal Grandmother    Stomach cancer Paternal  Grandfather    Diabetes Paternal Grandfather    Breast cancer Cousin        early 76s   Esophageal cancer Neg Hx    Outpatient Medications Prior to Visit  Medication Sig Dispense Refill   albuterol (VENTOLIN HFA) 108 (90 Base) MCG/ACT inhaler Inhale 2 puffs into the lungs every 4 (four) hours as needed for wheezing or shortness of breath. 1 each 2   amLODipine (NORVASC) 10 MG tablet Take 1 tablet (10 mg total) by mouth daily. 90 tablet 3   atorvastatin (LIPITOR) 20 MG tablet Take 1 tablet (20 mg total) by mouth daily. 90 tablet 3   cetirizine (ZYRTEC) 10 MG tablet Take 1 tablet (10 mg total) by mouth daily. 90 tablet 0   EPINEPHrine (EPIPEN 2-PAK) 0.3 mg/0.3 mL IJ SOAJ injection Inject 0.3 mLs (0.3 mg total) into the muscle as needed (allergic reaction). 1 Device 1   ergocalciferol (VITAMIN D2) 1.25 MG (50000 UT) capsule Take 1 capsule by mouth every 7 (seven) days.     fluticasone (FLONASE) 50 MCG/ACT nasal spray SHAKE LIQUID AND USE 2 SPRAYS IN EACH NOSTRIL DAILY 16 g 2   furosemide (LASIX) 20 MG tablet TAKE 1 TABLET BY MOUTH DAILY AS NEEDED 90 tablet 2   ibuprofen (ADVIL) 600 MG tablet      Lancets (ONETOUCH DELICA PLUS [id number]) MISC daily. as directed     lidocaine (LIDODERM) 5 % Place 1 patch onto the skin daily. Remove & Discard patch within 12 hours or as directed by MD 30 patch 0   metFORMIN (GLUCOPHAGE-XR) 500 MG 24 hr tablet Take by mouth.     naproxen (NAPROSYN) 375 MG tablet Take 1 tablet (375 mg total) by mouth 2 (two) times daily. 20 tablet 0   omeprazole (PRILOSEC) 40 MG capsule TAKE 1 CAPSULE(40 MG) BY MOUTH TWICE DAILY 180 capsule 0   ondansetron (ZOFRAN) 4 MG tablet Take 1 tablet (4 mg total) by mouth every 8 (eight) hours as needed for nausea or vomiting. 40 tablet 0   ONETOUCH VERIO test strip daily.     TIROSINT 200 MCG CAPS Take by mouth.     TIROSINT 25 MCG CAPS Take 1 capsule by mouth daily.     No facility-administered medications prior to visit.   Allergies   Allergen Reactions   Ciprofloxacin Anaphylaxis   Quinolones Anaphylaxis   Bee Venom Swelling and Rash     Objective:   Today's Vitals   12/28/21 0823  BP: 122/74  Pulse: 78  Temp: 97.8 F (36.6 C)  TempSrc: Temporal  SpO2: 96%  Weight: 226 lb (102.5 kg)  Height: '5\' 7"'$  (1.702 m)   Body mass index is 35.4 kg/m.   General: Well developed, well nourished. No acute distress. Feet- Skin intact. No sign of maceration between toes. Nails are normal. Dorsalis pedis and   posterior  tibial artery pulses are normal. 5.07 monofilament testing normal. Psych: Alert and oriented. Normal mood and affect.  Health Maintenance Due  Topic Date Due   Hepatitis C Screening  Never done   COLONOSCOPY (Pts 45-16yr Insurance coverage will need to be confirmed)  Never done   OPHTHALMOLOGY EXAM  02/23/2021   Zoster Vaccines- Shingrix (2 of 2) 11/22/2021   Lab Results Last lipids Lab Results  Component Value Date   CHOL 225 (H) 09/27/2021   HDL 51.70 09/27/2021   LDLCALC 140 (H) 09/27/2021   TRIG 163.0 (H) 09/27/2021   CHOLHDL 4 09/27/2021   Last hemoglobin A1c Lab Results  Component Value Date   HGBA1C 6.3 09/27/2021    Ref Range & Units 1 mo ago  HEMOGLOBIN A1C <5.8 % 6.1 High      Ref Range & Units 1 mo ago  TSH 0.45 - 5.00 UIU/ML 10.97 High      Ref Range & Units 1 mo ago  FREE T4 0.6 - 1.3 NG/DL 0.8      Assessment & Plan:   1. Type 2 diabetes mellitus without complication, without long-term current use of insulin (H[facility]) Diabetes is in good control. Foot exam completed today. I strongly urged MS. GAdela Portsto see her eye doctor for her annual exam. She will continue on metformin.  2. Primary hypothyroidism TSH has been high. Dr. HBurney Gauzewill continue to manage this.  3. Essential hypertension Blood pressure in excellent control. Continue amlodipine.  4. Mixed hyperlipidemia Due to reassess lipids to see if atorvastatin 20 mg is lowering her LDL < 100.  - Lipid  panel  5. Hot flashes due to menopause We will give a trial of estrogen replacement to see if we can improve the hot flashes.  - estradiol (ESTRACE) 0.5 MG tablet; Take 1 tablet (0.5 mg total) by mouth daily.  Dispense: 90 tablet; Refill: 3  6. Encounter for hepatitis C screening test for low risk patient  - HCV Ab w Reflex to Quant PCR  Return in about 3 months (around 03/30/2022) for Reassessment.   SHaydee Salter MD

## 2021-12-29 LAB — HCV AB W REFLEX TO QUANT PCR: HCV Ab: NONREACTIVE

## 2021-12-29 LAB — HCV INTERPRETATION

## 2022-02-09 ENCOUNTER — Telehealth: Payer: Self-pay

## 2022-02-09 ENCOUNTER — Ambulatory Visit: Payer: Federal, State, Local not specified - PPO | Admitting: Gastroenterology

## 2022-02-09 ENCOUNTER — Encounter: Payer: Self-pay | Admitting: Gastroenterology

## 2022-02-09 VITALS — BP 130/88 | HR 77 | Ht 67.0 in | Wt 231.0 lb

## 2022-02-09 DIAGNOSIS — R1310 Dysphagia, unspecified: Secondary | ICD-10-CM | POA: Diagnosis not present

## 2022-02-09 DIAGNOSIS — K219 Gastro-esophageal reflux disease without esophagitis: Secondary | ICD-10-CM | POA: Diagnosis not present

## 2022-02-09 DIAGNOSIS — K449 Diaphragmatic hernia without obstruction or gangrene: Secondary | ICD-10-CM

## 2022-02-09 DIAGNOSIS — Z1211 Encounter for screening for malignant neoplasm of colon: Secondary | ICD-10-CM

## 2022-02-09 MED ORDER — CLENPIQ 10-3.5-12 MG-GM -GM/160ML PO SOLN: 1.0000 | ORAL | 0 refills | Status: DC

## 2022-02-09 MED ORDER — PANTOPRAZOLE SODIUM 40 MG PO TBEC: 40.0000 mg | DELAYED_RELEASE_TABLET | Freq: Two times a day (BID) | ORAL | 2 refills | Status: DC

## 2022-02-09 NOTE — Patient Instructions (Addendum)
If you are age 52 or younger, your body mass index should be between 19-25. Your Body mass index is 36.18 kg/m?Marland Kitchen If this is out of the aformentioned range listed, please consider follow up with your Primary Care Provider.  ? ?__________________________________________________________ ? ?The [facility] providers would like to encourage you to use [facility] to communicate with providers for non-urgent requests or questions.  Due to long hold times on the telephone, sending your provider a message by [facility] may be a faster and more efficient way to get a response.  Please allow 48 business hours for a response.  Please remember that this is for non-urgent requests.  ? ?Due to recent changes in healthcare laws, you may see the results of your imaging and laboratory studies on MyChart before your provider has had a chance to review them.  We understand that in some cases there may be results that are confusing or concerning to you. Not all laboratory results come back in the same time frame and the provider may be waiting for multiple results in order to interpret others.  Please give [place] 48 hours in order for your provider to thoroughly review all the results before contacting the office for clarification of your results.  ? ?You have been scheduled for an endoscopy and colonoscopy. Please follow the written instructions given to you at your visit today. ?Please pick up your prep supplies at the pharmacy within the next 1-3 days. ?If you use inhalers (even only as needed), please bring them with you on the day of your procedure.  ? ?We have sent the following medications to your pharmacy for you to pick up at your convenience: Protonix, Clenpiq ? ?Stop Prilosec ? ?Thank you for choosing me and [facility]. ? ?Gerrit Heck, D.O. ? ?We want to thank you for trusting [facility] with your care. All of our staff and providers value the relationships we have built with our patients, and  it is an honor to care for you.  ? ?We are writing to let you know that [facility] will close on Mar 06, 2022, and we invite you to continue to see Dr. Carmell Austria and Gerrit Heck at the [facility] [place] office location. We are consolidating our serices at these [facility] practices to better provide care. Our office staff will work with you to ensure a seamless transition.  ? ?Gerrit Heck, DO -Dr. Bryan Lemma will be movig to [facility] at [address], [place], [place] [address], effective Mar 06, 2022.  Contact [phone] to schedule an appointment with him.  ? ?Carmell Austria, MD- Dr. Lyndel Safe will be movig to [facility] at [address] [place], [place] [address], effective Mar 06, 2022.  Contact [phone] to schedule an appointment with him.  ? ?Requesting Medical Records ?If you need to request your medical records, please follow the instructions below. Your medical records are confidential, and a copy can be transferred to another provider or released to you or another person you designate only with your permission. ? ?There are several ways to request your medical records: ?Requests for medical records can be submitted through our practice.   ?You can also request your records electronically, in your MyChart account by selecting the ?Request Health Records? tab.  ?If you need additional information on how to request records, please go to [email or web address]/, choose Patient Information, then select Request Medical Records. ?To make an appointment or if you have any questions  about your health care needs, please contact our office at [phone] and one of our staff members will be glad to assist you. ?[facility] is committed to providing exceptional care for you and our community. Thank you for allowing [place] to serve your health care needs. ?Sincerely, ? ?Windy Canny, Director [facility] ?[facility] also offers  convenient virtual care options. Sore throat? Sinus problems? Cold or flu symptoms? Get care from the comfort of home with [facility] Video Visits and e-Visits. Learn more about the non-emergency conditions treated and start your virtual visit at [email or web address] ? ? ?

## 2022-02-09 NOTE — Progress Notes (Signed)
? ?Chief Complaint:    GERD, Colon Cancer screening ? ?GI History: 51 year old female with a history of HTN, hypothyroidism, seasonal allergies, follows in the GI clinic for the following: ? ?1) GERD, hiatal hernia.  Index symptoms of regurgitation, sour brash, raspy voice, dry cough. ?- 11/2018: Started Prilosec 40 mg/day ?- 12/2018: EGD as below ? ?2) Dysphagia.  Started in late 2019, and improved with EGD with dilation in 12/2018. ? ? ?Endoscopic History: ?- EGD (12/25/2018): 3 cm hiatal hernia, empiric 56 French Maloney dilation without mucosal wrap.  Biopsies negative for eosinophilic esophagitis.  8 mm submucosal nodule in antrum.  Otherwise normal stomach/duodenum ?- EUS (06/19/2019): Hiatal hernia, otherwise normal stomach.  9.1 mm x 3.4 mm hyperechoic lesion in the gastric antrum, with appearance diagnostic of benign lipoma.  No further evaluation needed ? ?HPI:   ? ? ?Patient is a 51 y.o. female presenting to the Gastroenterology Clinic for evaluation of GERD, dysphagia and discuss colon cancer screening.  ? ?She started having intermittent solid food dysphagia again in the last couple of months.  Worse with rice.  No food impactions, but has had to regurgitate food back out.  Otherwise, typically clears with liquids. ? ?GERD symptoms started worsening last year as well, increased Prilosec to 40 mg BID.  Still with intermittent breakthrough, particularly with tomato-based sauces.  Has had recurrence of raspy voice and hoarseness. ?  ?Separately, due for CRC screening. No lower GI sxs. No Fhx of CRC.  ? ? ?Review of systems:     No chest pain, no SOB, no fevers, no urinary sx  ? ?Past Medical History:  ?Diagnosis Date  ? GERD (gastroesophageal reflux disease)   ? Headache(784.0)   ? migraines  ? Hemorrhoids 12/2012  ? Hypertension   ? Hypothyroidism   ? Seasonal allergies   ? UTI (urinary tract infection)   ? Wears partial dentures   ? upper and lower  ? ? ?Patient's surgical history, family medical history,  social history, medications and allergies were all reviewed in Epic  ? ? ?Current Outpatient Medications  ?Medication Sig Dispense Refill  ? albuterol (VENTOLIN HFA) 108 (90 Base) MCG/ACT inhaler Inhale 2 puffs into the lungs every 4 (four) hours as needed for wheezing or shortness of breath. 1 each 2  ? amLODipine (NORVASC) 10 MG tablet Take 1 tablet (10 mg total) by mouth daily. 90 tablet 3  ? atorvastatin (LIPITOR) 20 MG tablet Take 1 tablet (20 mg total) by mouth daily. 90 tablet 3  ? cetirizine (ZYRTEC) 10 MG tablet Take 1 tablet (10 mg total) by mouth daily. 90 tablet 0  ? EPINEPHrine (EPIPEN 2-PAK) 0.3 mg/0.3 mL IJ SOAJ injection Inject 0.3 mLs (0.3 mg total) into the muscle as needed (allergic reaction). 1 Device 1  ? ergocalciferol (VITAMIN D2) 1.25 MG (50000 UT) capsule Take 1 capsule by mouth every 7 (seven) days.    ? estradiol (ESTRACE) 0.5 MG tablet Take 1 tablet (0.5 mg total) by mouth daily. 90 tablet 3  ? fluticasone (FLONASE) 50 MCG/ACT nasal spray SHAKE LIQUID AND USE 2 SPRAYS IN EACH NOSTRIL DAILY 16 g 2  ? furosemide (LASIX) 20 MG tablet TAKE 1 TABLET BY MOUTH DAILY AS NEEDED 90 tablet 2  ? ibuprofen (ADVIL) 600 MG tablet     ? Lancets (ONETOUCH DELICA PLUS [id number]) MISC daily. as directed    ? metFORMIN (GLUCOPHAGE-XR) 500 MG 24 hr tablet Take by mouth.    ? naproxen (NAPROSYN) 375 MG tablet  Take 1 tablet (375 mg total) by mouth 2 (two) times daily. 20 tablet 0  ? omeprazole (PRILOSEC) 40 MG capsule TAKE 1 CAPSULE(40 MG) BY MOUTH TWICE DAILY 180 capsule 0  ? ondansetron (ZOFRAN) 4 MG tablet Take 1 tablet (4 mg total) by mouth every 8 (eight) hours as needed for nausea or vomiting. 40 tablet 0  ? ONETOUCH VERIO test strip daily.    ? TIROSINT 200 MCG CAPS Take by mouth.    ? TIROSINT 25 MCG CAPS Take 1 capsule by mouth daily.    ? ?No current facility-administered medications for this visit.  ? ? ?Physical Exam:   ? ? ?BP 130/88 (BP Location: Left Arm, Patient Position: Sitting, Cuff Size:  Normal)   Pulse 77   Ht '5\' 7"'$  (1.702 m)   Wt 231 lb (104.8 kg)   SpO2 94%   BMI 36.18 kg/m?  ? ?GENERAL:  Pleasant female in NAD ?PSYCH: : Cooperative, normal affect ?Musculoskeletal:  Normal muscle tone, normal strength ?NEURO: Alert and oriented x 3, no focal neurologic deficits ? ? ?IMPRESSION and PLAN:   ? ?1) GERD ?2) Hiatal hernia ?- Change prilosec to Protonix 40 mg PO BID.  If better control of symptoms, can hopefully de-escalate to lower effective dose ?- EGD to evaluate for erosive esophagitis, change in hernia size ?- If EGD unrevealing and continued symptoms, plan for Esophageal Manometry and pH/impedance testing.  Can be done on PPI to evaluate for breakthrough despite acid suppression therapy ? ?3) Dysphagia ?- At improvement with previous EGD with empiric dilation ?- EGD with dilation ?- As above, if ongoing symptoms, plan for Esophageal Manometry and pH/impedance testing ? ?4) Colon cancer screening ?- Due for age-appropriate, average risk CRC screening ?- Schedule colonoscopy ?    ?The indications, risks, and benefits of EGD and colonoscopy were explained to the patient in detail. Risks include but are not limited to bleeding, perforation, adverse reaction to medications, and cardiopulmonary compromise. Sequelae include but are not limited to the possibility of surgery, hospitalization, and mortality. The patient verbalized understanding and wished to proceed. All questions answered, referred to scheduler and bowel prep ordered. Further recommendations pending results of the exam.  ? ?    ? ?Lavena Bullion ,DO, FACG 02/09/2022, 11:27 AM ? ?

## 2022-02-09 NOTE — Telephone Encounter (Signed)
Received PA approval for Pantoprazole 40 MG from [employer] valid from 01/10/22 through 02/09/2023. Pharmacy is notified. ?

## 2022-02-24 ENCOUNTER — Encounter: Payer: Self-pay | Admitting: Gastroenterology

## 2022-03-03 ENCOUNTER — Other Ambulatory Visit: Payer: Self-pay | Admitting: *Deleted

## 2022-03-03 ENCOUNTER — Encounter: Payer: Self-pay | Admitting: Gastroenterology

## 2022-03-03 ENCOUNTER — Ambulatory Visit (AMBULATORY_SURGERY_CENTER): Payer: Federal, State, Local not specified - PPO | Admitting: Gastroenterology

## 2022-03-03 VITALS — BP 113/80 | HR 70 | Temp 96.7°F | Resp 20 | Ht 67.0 in | Wt 231.0 lb

## 2022-03-03 DIAGNOSIS — K5730 Diverticulosis of large intestine without perforation or abscess without bleeding: Secondary | ICD-10-CM

## 2022-03-03 DIAGNOSIS — K449 Diaphragmatic hernia without obstruction or gangrene: Secondary | ICD-10-CM

## 2022-03-03 DIAGNOSIS — K317 Polyp of stomach and duodenum: Secondary | ICD-10-CM

## 2022-03-03 DIAGNOSIS — K2100 Gastro-esophageal reflux disease with esophagitis, without bleeding: Secondary | ICD-10-CM

## 2022-03-03 DIAGNOSIS — Z1211 Encounter for screening for malignant neoplasm of colon: Secondary | ICD-10-CM | POA: Diagnosis not present

## 2022-03-03 DIAGNOSIS — R1310 Dysphagia, unspecified: Secondary | ICD-10-CM | POA: Diagnosis not present

## 2022-03-03 MED ORDER — SODIUM CHLORIDE 0.9 % IV SOLN: 500.0000 mL | Freq: Once | INTRAVENOUS | Status: DC

## 2022-03-03 NOTE — Progress Notes (Signed)
? ?GASTROENTEROLOGY PROCEDURE H&P NOTE  ? ?Primary Care Physician: ?Haydee Salter, MD ? ? ? ?Reason for Procedure:  GERD, dysphagia, colon cancer screening, hiatal hernia ? ?Plan:    EGD, colonoscopy ? ?Patient is appropriate for endoscopic procedure(s) in the ambulatory ([facility]) setting. ? ?The nature of the procedure, as well as the risks, benefits, and alternatives were carefully and thoroughly reviewed with the patient. Ample time for discussion and questions allowed. The patient understood, was satisfied, and agreed to proceed.  ? ? ? ?HPI: ?Melanie Roberts is a 51 y.o. female who presents for EGD for evaluation of GERD, hiatal hernia, and dysphagia, along with colonoscopy for routine CRC screening. ? ?Past Medical History:  ?Diagnosis Date  ? Allergy   ? Diabetes mellitus without complication ([place])   ? GERD (gastroesophageal reflux disease)   ? Headache(784.0)   ? migraines  ? Hemorrhoids 12/2012  ? Hypertension   ? Hypothyroidism   ? Seasonal allergies   ? UTI (urinary tract infection)   ? Wears partial dentures   ? upper and lower  ? ? ?Past Surgical History:  ?Procedure Laterality Date  ? ADENOIDECTOMY  02/17/2005  ? CESAREAN SECTION  07/08/2003  ? ESOPHAGOGASTRODUODENOSCOPY (EGD) WITH PROPOFOL N/A 06/19/2019  ? Procedure: ESOPHAGOGASTRODUODENOSCOPY (EGD) WITH PROPOFOL;  Surgeon: Milus Banister, MD;  Location: [facility];  Service: Gastroenterology;  Laterality: N/A;  ? EUS N/A 06/19/2019  ? Procedure: UPPER ENDOSCOPIC ULTRASOUND (EUS) RADIAL;  Surgeon: Milus Banister, MD;  Location: [facility];  Service: Gastroenterology;  Laterality: N/A;  ? EVALUATION UNDER ANESTHESIA WITH HEMORRHOIDECTOMY N/A 01/14/2013  ? Procedure: EXAM UNDER ANESTHESIA WITH HEMORRHOIDECTOMY POSSIBLE BANDING;  Surgeon: Madilyn Hook, DO;  Location: [facility];  Service: General;  Laterality: N/A;  ? fibroids removed    ? LAPAROSCOPIC TOTAL HYSTERECTOMY  10/29/2006  ? MYOMECTOMY  12/18/2001  ? x 4  ? NASAL  TURBINATE REDUCTION Bilateral 02/07/2005  ? inferior and removed adenoids  ? ROTATOR CUFF REPAIR Right 2020  ? TONSILLECTOMY AND ADENOIDECTOMY  age 72  ? TUBAL LIGATION  07/08/2003  ? ? ?Prior to Admission medications   ?Medication Sig Start Date End Date Taking? Authorizing Provider  ?amLODipine (NORVASC) 10 MG tablet Take 1 tablet (10 mg total) by mouth daily. 09/27/21  Yes Haydee Salter, MD  ?atorvastatin (LIPITOR) 20 MG tablet Take 1 tablet (20 mg total) by mouth daily. 09/27/21  Yes Haydee Salter, MD  ?cetirizine (ZYRTEC) 10 MG tablet Take 1 tablet (10 mg total) by mouth daily. 06/29/21  Yes Kennyth Arnold, FNP  ?estradiol (ESTRACE) 0.5 MG tablet Take 1 tablet (0.5 mg total) by mouth daily. 12/28/21  Yes Haydee Salter, MD  ?furosemide (LASIX) 20 MG tablet TAKE 1 TABLET BY MOUTH DAILY AS NEEDED 10/19/20  Yes Hoyle Barkdull, Mary K, DO  ?metFORMIN (GLUCOPHAGE-XR) 500 MG 24 hr tablet Take by mouth. 08/16/21  Yes [provider]  ?naproxen (NAPROSYN) 375 MG tablet Take 1 tablet (375 mg total) by mouth 2 (two) times daily. 07/29/19  Yes Palumbo, April, MD  ?ondansetron (ZOFRAN) 4 MG tablet Take 1 tablet (4 mg total) by mouth every 8 (eight) hours as needed for nausea or vomiting. 11/06/19  Yes Aundra Dubin, PA-C  ?pantoprazole (PROTONIX) 40 MG tablet Take 1 tablet (40 mg total) by mouth 2 (two) times daily. 02/09/22  Yes Jerrelle Michelsen V, DO  ?TIROSINT 200 MCG CAPS Take by mouth. 08/16/21  Yes [provider]  ?Wendy Poet  MCG CAPS Take 1 capsule by mouth daily. 08/17/21  Yes [provider]  ?albuterol (VENTOLIN HFA) 108 (90 Base) MCG/ACT inhaler Inhale 2 puffs into the lungs every 4 (four) hours as needed for wheezing or shortness of breath. 07/01/21   Kennyth Arnold, FNP  ?EPINEPHrine (EPIPEN 2-PAK) 0.3 mg/0.3 mL IJ SOAJ injection Inject 0.3 mLs (0.3 mg total) into the muscle as needed (allergic reaction). 05/15/16   Marijean Heath, NP  ?ergocalciferol (VITAMIN D2) 1.25 MG (50000  UT) capsule Take 1 capsule by mouth every 7 (seven) days. 08/27/20   [provider]  ?fluticasone (FLONASE) 50 MCG/ACT nasal spray SHAKE LIQUID AND USE 2 SPRAYS IN EACH NOSTRIL DAILY 04/22/20   Ronnald Nian, DO  ?ibuprofen (ADVIL) 600 MG tablet  11/25/20   [provider]  ?Lancets (ONETOUCH DELICA PLUS [id number]) [place] daily. as directed 08/03/21   [provider]  ?Roma Schanz test strip daily. 08/03/21   [provider]  ? ? ?Current Outpatient Medications  ?Medication Sig Dispense Refill  ? amLODipine (NORVASC) 10 MG tablet Take 1 tablet (10 mg total) by mouth daily. 90 tablet 3  ? atorvastatin (LIPITOR) 20 MG tablet Take 1 tablet (20 mg total) by mouth daily. 90 tablet 3  ? cetirizine (ZYRTEC) 10 MG tablet Take 1 tablet (10 mg total) by mouth daily. 90 tablet 0  ? estradiol (ESTRACE) 0.5 MG tablet Take 1 tablet (0.5 mg total) by mouth daily. 90 tablet 3  ? furosemide (LASIX) 20 MG tablet TAKE 1 TABLET BY MOUTH DAILY AS NEEDED 90 tablet 2  ? metFORMIN (GLUCOPHAGE-XR) 500 MG 24 hr tablet Take by mouth.    ? naproxen (NAPROSYN) 375 MG tablet Take 1 tablet (375 mg total) by mouth 2 (two) times daily. 20 tablet 0  ? ondansetron (ZOFRAN) 4 MG tablet Take 1 tablet (4 mg total) by mouth every 8 (eight) hours as needed for nausea or vomiting. 40 tablet 0  ? pantoprazole (PROTONIX) 40 MG tablet Take 1 tablet (40 mg total) by mouth 2 (two) times daily. 60 tablet 2  ? TIROSINT 200 MCG CAPS Take by mouth.    ? TIROSINT 25 MCG CAPS Take 1 capsule by mouth daily.    ? albuterol (VENTOLIN HFA) 108 (90 Base) MCG/ACT inhaler Inhale 2 puffs into the lungs every 4 (four) hours as needed for wheezing or shortness of breath. 1 each 2  ? EPINEPHrine (EPIPEN 2-PAK) 0.3 mg/0.3 mL IJ SOAJ injection Inject 0.3 mLs (0.3 mg total) into the muscle as needed (allergic reaction). 1 Device 1  ? ergocalciferol (VITAMIN D2) 1.25 MG (50000 UT) capsule Take 1 capsule by mouth every 7 (seven) days.    ?  fluticasone (FLONASE) 50 MCG/ACT nasal spray SHAKE LIQUID AND USE 2 SPRAYS IN EACH NOSTRIL DAILY 16 g 2  ? ibuprofen (ADVIL) 600 MG tablet     ? Lancets (ONETOUCH DELICA PLUS [id number]) MISC daily. as directed    ? ONETOUCH VERIO test strip daily.    ? ?Current Facility-Administered Medications  ?Medication Dose Route Frequency Provider Last Rate Last Admin  ? 0.9 %  sodium chloride infusion  500 mL Intravenous Once Tarnisha Kachmar V, DO      ? ? ?Allergies as of 03/03/2022 - Review Complete 03/03/2022  ?Allergen Reaction Noted  ? Ciprofloxacin Anaphylaxis 05/14/2016  ? Quinolones Anaphylaxis 05/15/2016  ? Bee venom Swelling and Rash 01/07/2013  ? ? ?Family History  ?Problem Relation Age of Onset  ? Diabetes  Mother   ? Fibroids Mother   ?     uterine   ? Dementia Mother   ? Pancreatic cancer Father   ? Cancer Maternal Aunt   ?     Breast  ? Breast cancer Maternal Aunt   ?     late 36s  ? Cancer Maternal Aunt   ?     Liver  ? Cancer Maternal Aunt   ?     Throat  ? Heart disease Maternal Uncle   ?     24s  ? Colon cancer Paternal Uncle   ? Clotting disorder Maternal Grandmother   ? Diabetes Maternal Grandmother   ? Liver cancer Maternal Grandfather   ? Diabetes Maternal Grandfather   ? Pancreatic cancer Paternal Grandmother   ? Diabetes Paternal Grandmother   ? Stomach cancer Paternal Grandfather   ? Diabetes Paternal Grandfather   ? Breast cancer Cousin   ?     early 53s  ? Esophageal cancer Neg Hx   ? ? ?Social History  ? ?Socioeconomic History  ? Marital status: Divorced  ?  Spouse name: Not on file  ? Number of children: 3  ? Years of education: Not on file  ? Highest education level: Not on file  ?Occupational History  ? Occupation: Control and instrumentation engineer  ?  Comment: [employer]  ?Tobacco Use  ? Smoking status: Never  ? Smokeless tobacco: Never  ?Vaping Use  ? Vaping Use: Never used  ?Substance and Sexual Activity  ? Alcohol use: Yes  ?  Comment: socially  ? Drug use: No  ? Sexual activity: Yes  ?Other Topics Concern  ? Not on  file  ?Social History Narrative  ? Not on file  ? ?Social Determinants of Health  ? ?Financial Resource Strain: Not on file  ?Food Insecurity: Not on file  ?Transportation Needs: Not on file  ?Physical Activi

## 2022-03-03 NOTE — Patient Instructions (Signed)
YOU HAD AN ENDOSCOPIC PROCEDURE TODAY AT THE [facility]:   Refer to the procedure report that was given to you for any specific questions about what was found during the examination.  If the procedure report does not answer your questions, please call your gastroenterologist to clarify.  If you requested that your care partner not be given the details of your procedure findings, then the procedure report has been included in a sealed envelope for you to review at your convenience later.  YOU SHOULD EXPECT: Some feelings of bloating in the abdomen. Passage of more gas than usual.  Walking can help get rid of the air that was put into your GI tract during the procedure and reduce the bloating. If you had a lower endoscopy (such as a colonoscopy or flexible sigmoidoscopy) you may notice spotting of blood in your stool or on the toilet paper. If you underwent a bowel prep for your procedure, you may not have a normal bowel movement for a few days.  Please Note:  You might notice some irritation and congestion in your nose or some drainage.  This is from the oxygen used during your procedure.  There is no need for concern and it should clear up in a day or so.  SYMPTOMS TO REPORT IMMEDIATELY:   Following lower endoscopy (colonoscopy or flexible sigmoidoscopy):  Excessive amounts of blood in the stool  Significant tenderness or worsening of abdominal pains  Swelling of the abdomen that is new, acute  Fever of 100F or higher   Following upper endoscopy (EGD)  Vomiting of blood or coffee ground material  New chest pain or pain under the shoulder blades  Painful or persistently difficult swallowing  New shortness of breath  Fever of 100F or higher  Black, tarry-looking stools  For urgent or emergent issues, a gastroenterologist can be reached at any hour by calling [phone]. Do not use MyChart messaging for urgent concerns.    DIET:  We do recommend a small meal at first, but  then you may proceed to your regular diet.  Drink plenty of fluids but you should avoid alcoholic beverages for 24 hours.  ACTIVITY:  You should plan to take it easy for the rest of today and you should NOT DRIVE or use heavy machinery until tomorrow (because of the sedation medicines used during the test).    FOLLOW UP: Our staff will call the number listed on your records 48-72 hours following your procedure to check on you and address any questions or concerns that you may have regarding the information given to you following your procedure. If we do not reach you, we will leave a message.  We will attempt to reach you two times.  During this call, we will ask if you have developed any symptoms of COVID 19. If you develop any symptoms (ie: fever, flu-like symptoms, shortness of breath, cough etc.) before then, please call [phone].  If you test positive for Covid 19 in the 2 weeks post procedure, please call and report this information to us.    If any biopsies were taken you will be contacted by phone or by letter within the next 1-3 weeks.  Please call us at [phone] if you have not heard about the biopsies in 3 weeks.    SIGNATURES/CONFIDENTIALITY: You and/or your care partner have signed paperwork which will be entered into your electronic medical record.  These signatures attest to the fact that that the information above on   your After Visit Summary has been reviewed and is understood.  Full responsibility of the confidentiality of this discharge information lies with you and/or your care-partner. 

## 2022-03-03 NOTE — Op Note (Addendum)
[facility] ?Patient Name: Melanie Roberts ?Procedure Date: 03/03/2022 2:40 PM ?MRN: [id number] ?Endoscopist: Gerrit Heck , MD ?Age: 51 ?Referring MD:  ?Date of Birth: October 20, 1971 ?Gender: Female ?Account #: [id number] ?Procedure:                Upper GI endoscopy ?Indications:              Dysphagia, Esophageal reflux ?Medicines:                Monitored Anesthesia Care ?Procedure:                Pre-Anesthesia Assessment: ?                          - Prior to the procedure, a History and Physical  ?                          was performed, and patient medications and  ?                          allergies were reviewed. The patient's tolerance of  ?                          previous anesthesia was also reviewed. The risks  ?                          and benefits of the procedure and the sedation  ?                          options and risks were discussed with the patient.  ?                          All questions were answered, and informed consent  ?                          was obtained. Prior Anticoagulants: The patient has  ?                          taken no previous anticoagulant or antiplatelet  ?                          agents. ASA Grade Assessment: II - A patient with  ?                          mild systemic disease. After reviewing the risks  ?                          and benefits, the patient was deemed in  ?                          satisfactory condition to undergo the procedure. ?                          After obtaining informed consent, the endoscope was  ?  passed under direct vision. Throughout the  ?                          procedure, the patient's blood pressure, pulse, and  ?                          oxygen saturations were monitored continuously. The  ?                          GIF [id number] #[id number] was introduced through the  ?                          mouth, and advanced to the second part of duodenum.  ?                          The upper GI  endoscopy was accomplished without  ?                          difficulty. The patient tolerated the procedure  ?                          well. ?Scope In: ?Scope Out: ?Findings:                 LA Grade A (one or more mucosal breaks less than 5  ?                          mm, not extending between tops of 2 mucosal folds)  ?                          esophagitis with no bleeding was found 39 cm from  ?                          the incisors. ?                          A 3 cm hiatal hernia was present. ?                          The upper third of the esophagus and middle third  ?                          of the esophagus were normal. A guidewire was  ?                          placed and the scope was withdrawn. Dilation was  ?                          performed with a Savary dilator with no resistance  ?                          at 17 mm. The dilation site was examined following  ?  endoscope reinsertion and showed no bleeding,  ?                          mucosal tear or perforation. Estimated blood loss:  ?                          none. ?                          A single 9 mm submucosal papule (nodule) with no  ?                          bleeding was found in the gastric antrum. This has  ?                          been previously evaluated with EUS as a benign  ?                          lipoma. This appeared unchanged in size and  ?                          appearance and was not resampled today. ?                          A few small sessile polyps with no bleeding and no  ?                          stigmata of recent bleeding were found in the  ?                          gastric fundus and in the gastric body. These  ?                          polyps were removed with a cold biopsy forceps.  ?                          Resection and retrieval were complete. Estimated  ?                          blood loss was minimal. ?                          The examined duodenum was  normal. ?Complications:            No immediate complications. ?Estimated Blood Loss:     Estimated blood loss was minimal. ?Impression:               - LA Grade A reflux esophagitis with no bleeding. ?                          - 3 cm hiatal hernia. ?                          - Normal upper third of esophagus and middle third  ?  of esophagus. Dilated. ?                          - A single submucosal papule (nodule) found in the  ?                          stomach. ?                          - A few gastric polyps. Resected and retrieved. ?                          - Normal examined duodenum. ?Recommendation:           - Patient has a contact number available for  ?                          emergencies. The signs and symptoms of potential  ?                          delayed complications were discussed with the  ?                          patient. Return to normal activities tomorrow.  ?                          Written discharge instructions were provided to the  ?                          patient. ?                          - Resume previous diet. ?                          - Continue present medications. ?                          - Await pathology results. ?                          - Repeat upper endoscopy PRN for retreatment. ?                          - Continue Protonix to 40 mg PO BID for 6 more  ?                          weeks, then if reflux symptoms well controlled,  ?                          reduce back to 40 mg daily. ?Gerrit Heck, MD ?03/03/2022 3:35:50 PM ?

## 2022-03-03 NOTE — Progress Notes (Signed)
To Pacu, VSS. Report to [email or web address] 

## 2022-03-03 NOTE — Progress Notes (Signed)
Called to room to assist during endoscopic procedure.  Patient ID and intended procedure confirmed with present staff. Received instructions for my participation in the procedure from the performing physician.  

## 2022-03-03 NOTE — Op Note (Signed)
[place] ?Patient Name: Melanie Roberts ?Procedure Date: 03/03/2022 2:40 PM ?MRN: [id number] ?Endoscopist: Gerrit Heck , MD ?Age: 51 ?Referring MD:  ?Date of Birth: Mar 16, 1971 ?Gender: Female ?Account #: [id number] ?Procedure:                Colonoscopy ?Indications:              Screening for colorectal malignant neoplasm, This  ?                          is the patient's first colonoscopy ?Medicines:                Monitored Anesthesia Care ?Procedure:                Pre-Anesthesia Assessment: ?                          - Prior to the procedure, a History and Physical  ?                          was performed, and patient medications and  ?                          allergies were reviewed. The patient's tolerance of  ?                          previous anesthesia was also reviewed. The risks  ?                          and benefits of the procedure and the sedation  ?                          options and risks were discussed with the patient.  ?                          All questions were answered, and informed consent  ?                          was obtained. Prior Anticoagulants: The patient has  ?                          taken no previous anticoagulant or antiplatelet  ?                          agents. ASA Grade Assessment: II - A patient with  ?                          mild systemic disease. After reviewing the risks  ?                          and benefits, the patient was deemed in  ?                          satisfactory condition to undergo the procedure. ?  After obtaining informed consent, the colonoscope  ?                          was passed under direct vision. Throughout the  ?                          procedure, the patient's blood pressure, pulse, and  ?                          oxygen saturations were monitored continuously. The  ?                          CF [id number] #[id number] was introduced through the anus  ?                          and advanced  to the the cecum, identified by  ?                          appendiceal orifice and ileocecal valve. The  ?                          colonoscopy was performed without difficulty. The  ?                          patient tolerated the procedure well. The quality  ?                          of the bowel preparation was good. The ileocecal  ?                          valve, appendiceal orifice, and rectum were  ?                          photographed. ?Scope In: 3:00:12 PM ?Scope Out: 3:20:20 PM ?Scope Withdrawal Time: 0 hours 12 minutes 43 seconds  ?Total Procedure Duration: 0 hours 20 minutes 8 seconds  ?Findings:                 The perianal and digital rectal examinations were  ?                          normal. ?                          There was a single small diverticulum in the  ?                          ascending colon. Otherwise, the entire colon was  ?                          normal. ?                          The retroflexed view of the distal rectum and anal  ?  verge was normal and showed no anal or rectal  ?                          abnormalities. ?Complications:            No immediate complications. ?Estimated Blood Loss:     Estimated blood loss: none. ?Impression:               - The entire examined colon is normal. ?                          - The distal rectum and anal verge are normal on  ?                          retroflexion view. ?                          - No specimens collected. ?Recommendation:           - Patient has a contact number available for  ?                          emergencies. The signs and symptoms of potential  ?                          delayed complications were discussed with the  ?                          patient. Return to normal activities tomorrow.  ?                          Written discharge instructions were provided to the  ?                          patient. ?                          - Resume previous diet. ?                          -  Continue present medications. ?                          - Repeat colonoscopy in 10 years for screening  ?                          purposes. ?                          - Return to GI office PRN. ?Gerrit Heck, MD ?03/03/2022 3:38:34 PM ?

## 2022-03-07 ENCOUNTER — Telehealth: Payer: Self-pay

## 2022-03-07 NOTE — Telephone Encounter (Signed)
Left message on follow up call. 

## 2022-03-07 NOTE — Telephone Encounter (Signed)
?  Follow up Call- ? ? ?  03/03/2022  ?  2:16 PM  ?Call back number  ?Post procedure Call Back phone  # [phone]  ?Permission to leave phone message Yes  ?  ? ?Patient questions: ? ?Do you have a fever, pain , or abdominal swelling? No. ?Pain Score  0 * ? ?Have you tolerated food without any problems? Yes.   ? ?Have you been able to return to your normal activities? Yes.   ? ?Do you have any questions about your discharge instructions: ?Diet   No. ?Medications  No. ?Follow up visit  No. ? ?Do you have questions or concerns about your Care? No. ? ?Actions: ?* If pain score is 4 or above: ?No action needed, pain <4. ? ? ?

## 2022-03-09 ENCOUNTER — Encounter: Payer: Self-pay | Admitting: Gastroenterology

## 2022-03-17 ENCOUNTER — Ambulatory Visit: Payer: Federal, State, Local not specified - PPO | Admitting: Family Medicine

## 2022-03-17 ENCOUNTER — Telehealth: Payer: Self-pay | Admitting: Gastroenterology

## 2022-03-17 VITALS — BP 136/86 | HR 80 | Temp 97.6°F | Ht 67.0 in | Wt 232.2 lb

## 2022-03-17 DIAGNOSIS — R051 Acute cough: Secondary | ICD-10-CM

## 2022-03-17 DIAGNOSIS — K2100 Gastro-esophageal reflux disease with esophagitis, without bleeding: Secondary | ICD-10-CM

## 2022-03-17 DIAGNOSIS — N62 Hypertrophy of breast: Secondary | ICD-10-CM | POA: Diagnosis not present

## 2022-03-17 MED ORDER — SUCRALFATE 1 G PO TABS: 1.0000 g | ORAL_TABLET | Freq: Three times a day (TID) | ORAL | 0 refills | Status: DC

## 2022-03-17 NOTE — Progress Notes (Signed)
[facility] [address] [place] [address] Dept: [phone] Dept Fax: [phone]  Office Visit  Subjective:    Patient ID: Melanie Roberts, female    DOB: 1971-07-29, 51 y.o..   MRN: [id number]  Chief Complaint  Patient presents with   Acute Visit    C/o having dry cough with clear phlegm and SOB  x 1 week.  No OTC meds.      History of Present Illness:  Patient is in today for evaluation of a recent cough. This started right after she went through an EGD and colonoscopy last week. She notes the cough is mostly dry, though she has started to have a little clear mucous production. She has not been having any nasal congestin or rhinorrhea. She has a history of GERD, so wondered if this might related to her acid reflux. She has been drinking hot tea with honey and it does temporarily soother her cough.  Melanie Roberts also notes her desire to see a surgeon to discuss breast reduction. She notes she chronically chafes under her breast. She also experiences chronic upper back pain.  Past Medical History: Patient Active Problem List   Diagnosis Date Noted   Macromastia 03/17/2022   Thyroid nodule 09/27/2021   Gastroesophageal reflux disease 09/27/2021   Low back pain 09/27/2021   Pedal edema 09/27/2021   Plantar fasciitis 09/27/2021   Essential hypertension 09/27/2021   Type 2 diabetes mellitus ([place]) 04/06/2021   Vitamin D deficiency 10/05/2020   Pain in right elbow 11/13/2019   Impingement syndrome of right shoulder 10/29/2019   Nontraumatic incomplete tear of right rotator cuff 10/29/2019   Gastric nodule    Primary hypothyroidism 01/26/2017   Hyperlipidemia 01/26/2017   Bee sting allergy 05/14/2016   Idiopathic urticaria 09/29/2015   Past Surgical History:  Procedure Laterality Date   ADENOIDECTOMY  02/17/2005   CESAREAN SECTION  07/08/2003   ESOPHAGOGASTRODUODENOSCOPY (EGD) WITH PROPOFOL N/A 06/19/2019    Procedure: ESOPHAGOGASTRODUODENOSCOPY (EGD) WITH PROPOFOL;  Surgeon: Milus Banister, MD;  Location: [facility];  Service: Gastroenterology;  Laterality: N/A;   EUS N/A 06/19/2019   Procedure: UPPER ENDOSCOPIC ULTRASOUND (EUS) RADIAL;  Surgeon: Milus Banister, MD;  Location: [facility];  Service: Gastroenterology;  Laterality: N/A;   EVALUATION UNDER ANESTHESIA WITH HEMORRHOIDECTOMY N/A 01/14/2013   Procedure: EXAM UNDER ANESTHESIA WITH HEMORRHOIDECTOMY POSSIBLE BANDING;  Surgeon: Madilyn Hook, DO;  Location: [facility];  Service: General;  Laterality: N/A;   fibroids removed     LAPAROSCOPIC TOTAL HYSTERECTOMY  10/29/2006   MYOMECTOMY  12/18/2001   x 4   NASAL TURBINATE REDUCTION Bilateral 02/07/2005   inferior and removed adenoids   ROTATOR CUFF REPAIR Right 2020   TONSILLECTOMY AND ADENOIDECTOMY  age 14   TUBAL LIGATION  07/08/2003   Family History  Problem Relation Age of Onset   Diabetes Mother    Fibroids Mother        uterine    Dementia Mother    Pancreatic cancer Father    Cancer Maternal Aunt        Breast   Breast cancer Maternal Aunt        late 43s   Cancer Maternal Aunt        Liver   Cancer Maternal Aunt        Throat   Heart disease Maternal Uncle        49s   Colon cancer Paternal Uncle    Clotting  disorder Maternal Grandmother    Diabetes Maternal Grandmother    Liver cancer Maternal Grandfather    Diabetes Maternal Grandfather    Pancreatic cancer Paternal Grandmother    Diabetes Paternal Grandmother    Stomach cancer Paternal Grandfather    Diabetes Paternal Grandfather    Breast cancer Cousin        early 61s   Esophageal cancer Neg Hx    Outpatient Medications Prior to Visit  Medication Sig Dispense Refill   albuterol (VENTOLIN HFA) 108 (90 Base) MCG/ACT inhaler Inhale 2 puffs into the lungs every 4 (four) hours as needed for wheezing or shortness of breath. 1 each 2   amLODipine (NORVASC) 10 MG tablet Take 1 tablet (10 mg  total) by mouth daily. 90 tablet 3   atorvastatin (LIPITOR) 20 MG tablet Take 1 tablet (20 mg total) by mouth daily. 90 tablet 3   cetirizine (ZYRTEC) 10 MG tablet Take 1 tablet (10 mg total) by mouth daily. 90 tablet 0   EPINEPHrine (EPIPEN 2-PAK) 0.3 mg/0.3 mL IJ SOAJ injection Inject 0.3 mLs (0.3 mg total) into the muscle as needed (allergic reaction). 1 Device 1   ergocalciferol (VITAMIN D2) 1.25 MG (50000 UT) capsule Take 1 capsule by mouth every 7 (seven) days.     estradiol (ESTRACE) 0.5 MG tablet Take 1 tablet (0.5 mg total) by mouth daily. 90 tablet 3   fluticasone (FLONASE) 50 MCG/ACT nasal spray SHAKE LIQUID AND USE 2 SPRAYS IN EACH NOSTRIL DAILY 16 g 2   furosemide (LASIX) 20 MG tablet TAKE 1 TABLET BY MOUTH DAILY AS NEEDED 90 tablet 2   ibuprofen (ADVIL) 600 MG tablet      Lancets (ONETOUCH DELICA PLUS [id number]) MISC daily. as directed     metFORMIN (GLUCOPHAGE-XR) 500 MG 24 hr tablet Take by mouth.     naproxen (NAPROSYN) 375 MG tablet Take 1 tablet (375 mg total) by mouth 2 (two) times daily. 20 tablet 0   ondansetron (ZOFRAN) 4 MG tablet Take 1 tablet (4 mg total) by mouth every 8 (eight) hours as needed for nausea or vomiting. 40 tablet 0   ONETOUCH VERIO test strip daily.     pantoprazole (PROTONIX) 40 MG tablet Take 1 tablet (40 mg total) by mouth 2 (two) times daily. 60 tablet 2   TIROSINT 200 MCG CAPS Take by mouth.     TIROSINT 25 MCG CAPS Take 1 capsule by mouth daily.     No facility-administered medications prior to visit.   Allergies  Allergen Reactions   Ciprofloxacin Anaphylaxis   Quinolones Anaphylaxis   Bee Venom Swelling and Rash    Objective:   Today's Vitals   03/17/22 1500  BP: 136/86  Pulse: 80  Temp: 97.6 F (36.4 C)  TempSrc: Temporal  SpO2: 98%  Weight: 232 lb 3.2 oz (105.3 kg)  Height: '5\' 7"'$  (1.702 m)   Body mass index is 36.37 kg/m.   General: Well developed, well nourished. No acute distress. HEENT: Normocephalic, non-traumatic.  Nose clear without congestion or rhinorrhea. Mucous membranes   moist. Oropharynx clear. Good dentition. Neck: Supple. No lymphadenopathy. No thyromegaly. Lungs: Clear to auscultation bilaterally. No wheezing, rales or rhonchi. Psych: Alert and oriented. Normal mood and affect.  Health Maintenance Due  Topic Date Due   COVID-19 Vaccine (3 - Booster for Pfizer series) 05/21/2020   OPHTHALMOLOGY EXAM  02/23/2021     Assessment & Plan:   1. Acute cough I suspect that this is due to irritation of  the posterior oropharynx related to her recent endoscopy. I agree with her use of hot tea with honey. I will add to this a course of Carafate to see if this will reduce some of the reflux symptoms. She is scheduled to see me on 6/12. I will have her follow-up then.  - sucralfate (CARAFATE) 1 g tablet; Take 1 tablet (1 g total) by mouth 4 (four) times daily -  with meals and at bedtime.  Dispense: 48 tablet; Refill: 0  2. Gastroesophageal reflux disease with esophagitis without hemorrhage As above.  - sucralfate (CARAFATE) 1 g tablet; Take 1 tablet (1 g total) by mouth 4 (four) times daily -  with meals and at bedtime.  Dispense: 48 tablet; Refill: 0  3. Macromastia I will refer her to general surgery to discuss breast reduction.  - Ambulatory referral to General Surgery   Return for As scheduled.   Haydee Salter, MD

## 2022-03-17 NOTE — Telephone Encounter (Signed)
Patient called stating she has had a dry cough with clear "mucus" for about a week after procedure. Patient wants to know what she can do? Please advise.

## 2022-03-17 NOTE — Telephone Encounter (Signed)
Pt stated that she started having a dry cough the day after her procedures done on May 12th. Endo and Colon:  Pt was notified that this is not typically a side effect of the procedures and was recommended that pt reach out to her PCP:  Pt verbalized understanding with all questions answered.

## 2022-04-03 ENCOUNTER — Telehealth: Payer: Self-pay | Admitting: Family Medicine

## 2022-04-03 ENCOUNTER — Ambulatory Visit: Payer: Federal, State, Local not specified - PPO | Admitting: Family Medicine

## 2022-04-03 NOTE — Telephone Encounter (Signed)
Sent no show letter [phone]

## 2022-05-15 NOTE — Telephone Encounter (Signed)
1st no show, fee waived ?

## 2022-05-19 ENCOUNTER — Other Ambulatory Visit: Payer: Self-pay

## 2022-05-19 MED ORDER — PANTOPRAZOLE SODIUM 40 MG PO TBEC: 40.0000 mg | DELAYED_RELEASE_TABLET | Freq: Two times a day (BID) | ORAL | 3 refills | Status: DC

## 2022-07-20 DIAGNOSIS — R0789 Other chest pain: Secondary | ICD-10-CM | POA: Insufficient documentation

## 2022-07-20 DIAGNOSIS — Z20822 Contact with and (suspected) exposure to covid-19: Secondary | ICD-10-CM | POA: Diagnosis not present

## 2022-07-20 DIAGNOSIS — R051 Acute cough: Secondary | ICD-10-CM | POA: Insufficient documentation

## 2022-07-20 DIAGNOSIS — Z79899 Other long term (current) drug therapy: Secondary | ICD-10-CM | POA: Insufficient documentation

## 2022-07-20 DIAGNOSIS — Z7984 Long term (current) use of oral hypoglycemic drugs: Secondary | ICD-10-CM | POA: Diagnosis not present

## 2022-07-20 DIAGNOSIS — E119 Type 2 diabetes mellitus without complications: Secondary | ICD-10-CM | POA: Insufficient documentation

## 2022-07-20 DIAGNOSIS — E039 Hypothyroidism, unspecified: Secondary | ICD-10-CM | POA: Insufficient documentation

## 2022-07-20 DIAGNOSIS — R059 Cough, unspecified: Secondary | ICD-10-CM | POA: Diagnosis not present

## 2022-07-20 DIAGNOSIS — I10 Essential (primary) hypertension: Secondary | ICD-10-CM | POA: Insufficient documentation

## 2022-07-21 ENCOUNTER — Other Ambulatory Visit: Payer: Self-pay

## 2022-07-21 ENCOUNTER — Encounter (HOSPITAL_BASED_OUTPATIENT_CLINIC_OR_DEPARTMENT_OTHER): Payer: Self-pay

## 2022-07-21 ENCOUNTER — Emergency Department (HOSPITAL_BASED_OUTPATIENT_CLINIC_OR_DEPARTMENT_OTHER)
Admission: EM | Admit: 2022-07-21 | Discharge: 2022-07-21 | Disposition: A | Discharge status: Home / Self Care | Payer: Federal, State, Local not specified - PPO | Attending: Emergency Medicine | Admitting: Emergency Medicine

## 2022-07-21 ENCOUNTER — Emergency Department (HOSPITAL_BASED_OUTPATIENT_CLINIC_OR_DEPARTMENT_OTHER): Payer: Federal, State, Local not specified - PPO

## 2022-07-21 ENCOUNTER — Emergency Department (HOSPITAL_BASED_OUTPATIENT_CLINIC_OR_DEPARTMENT_OTHER): Payer: Federal, State, Local not specified - PPO | Admitting: Radiology

## 2022-07-21 DIAGNOSIS — R0602 Shortness of breath: Secondary | ICD-10-CM | POA: Diagnosis not present

## 2022-07-21 DIAGNOSIS — R059 Cough, unspecified: Secondary | ICD-10-CM | POA: Diagnosis not present

## 2022-07-21 DIAGNOSIS — R051 Acute cough: Secondary | ICD-10-CM

## 2022-07-21 LAB — CBC WITH DIFFERENTIAL/PLATELET
Abs Immature Granulocytes: 0.01 10*3/uL (ref 0.00–0.07)
Basophils Absolute: 0.1 10*3/uL (ref 0.0–0.1)
Basophils Relative: 1 %
Eosinophils Absolute: 0.1 10*3/uL (ref 0.0–0.5)
Eosinophils Relative: 2 %
HCT: 35.6 % — ABNORMAL LOW (ref 36.0–46.0)
Hemoglobin: 11.4 g/dL — ABNORMAL LOW (ref 12.0–15.0)
Immature Granulocytes: 0 %
Lymphocytes Relative: 53 %
Lymphs Abs: 3.1 10*3/uL (ref 0.7–4.0)
MCH: 29.2 pg (ref 26.0–34.0)
MCHC: 32 g/dL (ref 30.0–36.0)
MCV: 91 fL (ref 80.0–100.0)
Monocytes Absolute: 0.4 10*3/uL (ref 0.1–1.0)
Monocytes Relative: 6 %
Neutro Abs: 2.3 10*3/uL (ref 1.7–7.7)
Neutrophils Relative %: 38 %
Platelets: 287 10*3/uL (ref 150–400)
RBC: 3.91 MIL/uL (ref 3.87–5.11)
RDW: 14.7 % (ref 11.5–15.5)
WBC: 6 10*3/uL (ref 4.0–10.5)
nRBC: 0 % (ref 0.0–0.2)

## 2022-07-21 LAB — BASIC METABOLIC PANEL
Anion gap: 10 (ref 5–15)
BUN: 13 mg/dL (ref 6–20)
CO2: 29 mmol/L (ref 22–32)
Calcium: 9.8 mg/dL (ref 8.9–10.3)
Chloride: 103 mmol/L (ref 98–111)
Creatinine, Ser: 0.91 mg/dL (ref 0.44–1.00)
GFR, Estimated: >60 mL/min (ref >60)
Glucose, Bld: 121 mg/dL — ABNORMAL HIGH (ref 70–99)
Potassium: 4 mmol/L (ref 3.5–5.1)
Sodium: 142 mmol/L (ref 135–145)

## 2022-07-21 LAB — RESP PANEL BY RT-PCR (FLU A&B, COVID) ARPGX2
Influenza A by PCR: NEGATIVE
Influenza B by PCR: NEGATIVE
SARS Coronavirus 2 by RT PCR: NEGATIVE

## 2022-07-21 LAB — TROPONIN I (HIGH SENSITIVITY): Troponin I (High Sensitivity): 2 ng/L (ref <18)

## 2022-07-21 MED ORDER — BENZONATATE 100 MG PO CAPS
200.0000 mg | ORAL_CAPSULE | Freq: Once | ORAL | Status: AC
  Administered 2022-07-21: 200 mg via ORAL
  Filled 2022-07-21: qty 2

## 2022-07-21 MED ORDER — BENZONATATE 100 MG PO CAPS: 100.0000 mg | ORAL_CAPSULE | Freq: Three times a day (TID) | ORAL | 0 refills | Status: DC | PRN

## 2022-07-21 MED ORDER — IOHEXOL 350 MG/ML SOLN
100.0000 mL | Freq: Once | INTRAVENOUS | Status: AC | PRN
  Administered 2022-07-21: 100 mL via INTRAVENOUS

## 2022-07-21 NOTE — ED Notes (Signed)
Pt d/c home per MD order. Discharge summary reviewed, pt verbalizes understanding. Ambulatory. No s/s of acute distress noted at discharge.

## 2022-07-21 NOTE — ED Triage Notes (Signed)
Nonproductive cough x 1 week.

## 2022-07-21 NOTE — ED Provider Notes (Signed)
Melanie Roberts   CSN: [id number] Arrival date & time: 07/20/22  2359     History  Chief Complaint  Patient presents with   Cough    Melanie Roberts is a 51 y.o. female.  The history is provided by the patient and medical records.  Cough Melanie Roberts is a 51 y.o. female who presents to the Emergency Department complaining of cough.  She presents to the ED for evaluation of cough that started one week ago.  Cough is nonproductive, dry.  Has chest soreness from coughing.  Has nausea.  No abdominal pain, vomiting. Has BLE edema for one week.  Has watery eyes.     Hx/o HTN, HPL, DM, hypothyroid   She takes estrogen.  Has a family history of DVT/PE. Home Medications Prior to Admission medications   Medication Sig Start Date End Date Taking? Authorizing Provider  benzonatate (TESSALON) 100 MG capsule Take 1 capsule (100 mg total) by mouth 3 (three) times daily as needed for cough. 07/21/22  Yes Quintella Reichert, MD  albuterol (VENTOLIN HFA) 108 (90 Base) MCG/ACT inhaler Inhale 2 puffs into the lungs every 4 (four) hours as needed for wheezing or shortness of breath. 07/01/21   Kennyth Arnold, FNP  amLODipine (NORVASC) 10 MG tablet Take 1 tablet (10 mg total) by mouth daily. 09/27/21   Haydee Salter, MD  atorvastatin (LIPITOR) 20 MG tablet Take 1 tablet (20 mg total) by mouth daily. 09/27/21   Haydee Salter, MD  cetirizine (ZYRTEC) 10 MG tablet Take 1 tablet (10 mg total) by mouth daily. 06/29/21   Kennyth Arnold, FNP  EPINEPHrine (EPIPEN 2-PAK) 0.3 mg/0.3 mL IJ SOAJ injection Inject 0.3 mLs (0.3 mg total) into the muscle as needed (allergic reaction). 05/15/16   Whiteheart, Cristal Ford, NP  ergocalciferol (VITAMIN D2) 1.25 MG (50000 UT) capsule Take 1 capsule by mouth every 7 (seven) days. 08/27/20   [provider]  estradiol (ESTRACE) 0.5 MG tablet Take 1 tablet (0.5 mg total) by mouth daily. 12/28/21   Haydee Salter, MD   fluticasone [facility]) 50 MCG/ACT nasal spray SHAKE LIQUID AND USE 2 SPRAYS IN [facility] NOSTRIL DAILY 04/22/20   Cirigliano, Mary K, DO  furosemide (LASIX) 20 MG tablet TAKE 1 TABLET BY MOUTH DAILY AS NEEDED 10/19/20   Ronnald Nian, DO  ibuprofen (ADVIL) 600 MG tablet  11/25/20   [provider]  Lancets (ONETOUCH DELICA PLUS [id number]) [place] daily. as directed 08/03/21   [provider]  metFORMIN (GLUCOPHAGE-XR) 500 MG 24 hr tablet Take by mouth. 08/16/21   [provider]  naproxen (NAPROSYN) 375 MG tablet Take 1 tablet (375 mg total) by mouth 2 (two) times daily. 07/29/19   Palumbo, April, MD  ondansetron (ZOFRAN) 4 MG tablet Take 1 tablet (4 mg total) by mouth every 8 (eight) hours as needed for nausea or vomiting. 11/06/19   Aundra Dubin, PA-C  ONETOUCH VERIO test strip daily. 08/03/21   [provider]  pantoprazole (PROTONIX) 40 MG tablet Take 1 tablet (40 mg total) by mouth 2 (two) times daily. 05/19/22   Cirigliano, Vito V, DO  sucralfate (CARAFATE) 1 g tablet Take 1 tablet (1 g total) by mouth 4 (four) times daily -  with meals and at bedtime. 03/17/22   Haydee Salter, MD  TIROSINT 200 MCG CAPS Take by mouth. 08/16/21   [provider]  TIROSINT 25 MCG CAPS Take 1 capsule by mouth daily.  08/17/21   [provider]      Allergies    Ciprofloxacin, Quinolones, and Bee venom    Review of Systems   Review of Systems  Respiratory:  Positive for cough.   All other systems reviewed and are negative.   Physical Exam Updated Vital Signs BP (!) 140/98   Pulse 65   Temp 98.9 F (37.2 C)   Resp 15   Ht '5\' 7"'$  (1.702 m)   Wt 101.2 kg   SpO2 94%   BMI 34.93 kg/m  Physical Exam Vitals and nursing Roberts reviewed.  Constitutional:      Appearance: She is well-developed.  HENT:     Head: Normocephalic and atraumatic.  Cardiovascular:     Rate and Rhythm: Normal rate and regular rhythm.     Heart sounds: No murmur  heard. Pulmonary:     Effort: Pulmonary effort is normal. No respiratory distress.     Breath sounds: Normal breath sounds.  Abdominal:     Palpations: Abdomen is soft.     Tenderness: There is no abdominal tenderness. There is no guarding or rebound.  Musculoskeletal:        General: No swelling or tenderness.  Skin:    General: Skin is warm and dry.  Neurological:     Mental Status: She is alert and oriented to person, place, and time.  Psychiatric:        Behavior: Behavior normal.     ED Results / Procedures / Treatments   Labs (all labs ordered are listed, but only abnormal results are displayed) Labs Reviewed  BASIC METABOLIC PANEL - Abnormal; Notable for the following components:      Result Value   Glucose, Bld 121 (*)    All other components within normal limits  CBC WITH DIFFERENTIAL/PLATELET - Abnormal; Notable for the following components:   Hemoglobin 11.4 (*)    HCT 35.6 (*)    All other components within normal limits  RESP PANEL BY RT-PCR (FLU A&B, COVID) [id number]  TROPONIN I (HIGH SENSITIVITY)    EKG None  Radiology DG Chest 2 View  Result Date: 07/21/2022 CLINICAL DATA:  Cough EXAM: CHEST - 2 VIEW COMPARISON:  09/05/2020 FINDINGS: The heart size and mediastinal contours are within normal limits. Both lungs are clear. The visualized skeletal structures are unremarkable. IMPRESSION: Normal study. Electronically Signed   By: Rolm Baptise M.D.   On: 07/21/2022 00:26    Procedures Procedures    Medications Ordered in ED Medications  benzonatate (TESSALON) capsule 200 mg (200 mg Oral Given 07/21/22 0609)    ED Course/ Medical Decision Making/ A&P                           Medical Decision Making Amount and/or Complexity of Data Reviewed Labs: ordered. Radiology: ordered.  Risk Prescription drug management.   Patient with history of hypertension, diabetes here for evaluation of cough, chest discomfort.  Lungs are clear on evaluation with no  respiratory distress.  EKG is without acute ischemic changes and troponin is negative.  Chest x-ray without evidence of pneumonia.  Current clinical picture is not consistent with acute CHF.  Given patient's risk factors for PE recommend obtaining CTA to rule out PE.  Patient care transferred pending CTA.  Patient did feel improved after Tessalon improved administration.        Final Clinical Impression(s) / ED Diagnoses Final diagnoses:  Acute cough    Rx /  DC Orders ED Discharge Orders          Ordered    benzonatate (TESSALON) 100 MG capsule  3 times daily PRN        07/21/22 0730              Quintella Reichert, MD 07/21/22 [phone]

## 2022-07-21 NOTE — ED Provider Notes (Signed)
  Physical Exam  BP 126/87   Pulse 72   Temp 98.9 F (37.2 C)   Resp 20   Ht '5\' 7"'$  (1.702 m)   Wt 101.2 kg   SpO2 97%   BMI 34.93 kg/m   Physical Exam  Procedures  Procedures  ED Course / MDM   Received care or patient from Dr. Ralene Bathe. Please see her note for prior hx, physical and care. Briefly this is a 51yo female who presents with concern for cough. Is currently on estrogen and is awaiting CT PE study given symptoms and risk factors.   CT PE study without pneumonia or PE.  Likely viral URI as etiology of symptoms. Patient discharged in stable condition with understanding of reasons to return.        Gareth Morgan, MD 07/25/22 1104

## 2022-07-24 DIAGNOSIS — E1165 Type 2 diabetes mellitus with hyperglycemia: Secondary | ICD-10-CM | POA: Diagnosis not present

## 2022-07-24 DIAGNOSIS — E039 Hypothyroidism, unspecified: Secondary | ICD-10-CM | POA: Diagnosis not present

## 2022-07-25 DIAGNOSIS — Z79890 Hormone replacement therapy (postmenopausal): Secondary | ICD-10-CM | POA: Diagnosis not present

## 2022-07-25 DIAGNOSIS — E039 Hypothyroidism, unspecified: Secondary | ICD-10-CM | POA: Diagnosis not present

## 2022-07-25 DIAGNOSIS — E119 Type 2 diabetes mellitus without complications: Secondary | ICD-10-CM | POA: Diagnosis not present

## 2022-07-25 DIAGNOSIS — Z7984 Long term (current) use of oral hypoglycemic drugs: Secondary | ICD-10-CM | POA: Diagnosis not present

## 2022-08-26 ENCOUNTER — Ambulatory Visit
Admission: EM | Admit: 2022-08-26 | Discharge: 2022-08-26 | Disposition: A | Discharge status: Home / Self Care | Payer: Federal, State, Local not specified - PPO | Attending: Internal Medicine | Admitting: Internal Medicine

## 2022-08-26 DIAGNOSIS — Z20828 Contact with and (suspected) exposure to other viral communicable diseases: Secondary | ICD-10-CM | POA: Diagnosis not present

## 2022-08-26 DIAGNOSIS — H65191 Other acute nonsuppurative otitis media, right ear: Secondary | ICD-10-CM

## 2022-08-26 DIAGNOSIS — J069 Acute upper respiratory infection, unspecified: Secondary | ICD-10-CM

## 2022-08-26 DIAGNOSIS — Z20822 Contact with and (suspected) exposure to covid-19: Secondary | ICD-10-CM

## 2022-08-26 MED ORDER — AMOXICILLIN 875 MG PO TABS: 875.0000 mg | ORAL_TABLET | Freq: Two times a day (BID) | ORAL | 0 refills | Status: DC

## 2022-08-26 NOTE — ED Triage Notes (Signed)
Pt c/o cough, sob, nasal drainage, nasal congestion, nausea, onset ~ a few days ago. Denies pain in triage.

## 2022-08-26 NOTE — Discharge Instructions (Signed)
You have an ear infection which is being treated with antibiotic.  Also appears that you have a viral upper respiratory infection that should run its course and self resolve with symptomatic treatment as we discussed.  COVID and flu test are pending.  We will call if they are positive.  Follow-up if symptoms persist or worsen.

## 2022-08-26 NOTE — ED Provider Notes (Signed)
[facility]    CSN: [id number] Arrival date & time: 08/26/22  1301      History   Chief Complaint Chief Complaint  Patient presents with   Cough    Exposed to Covid/fluFrom my son Wadie Lessen - Entered by patient    HPI Melanie Roberts is a 51 y.o. female.   Patient presents with nasal congestion, cough and nasal drainage that started a few days ago.  She denies chest pain, shortness of breath, sore throat, ear pain, nausea, vomiting, diarrhea, abdominal pain.  Patient reports that her son recently tested positive for the flu and he was also exposed to [place] so she would like COVID and flu testing today.  Patient reports that she has taken Mucinex over-the-counter with minimal improvement of symptoms.   Cough   Past Medical History:  Diagnosis Date   Allergy    Diabetes mellitus without complication (HCC)    GERD (gastroesophageal reflux disease)    Headache(784.0)    migraines   Hemorrhoids 12/2012   Hypertension    Hypothyroidism    Seasonal allergies    UTI (urinary tract infection)    Wears partial dentures    upper and lower    Patient Active Problem List   Diagnosis Date Noted   Macromastia 03/17/2022   Thyroid nodule 09/27/2021   Gastroesophageal reflux disease 09/27/2021   Low back pain 09/27/2021   Pedal edema 09/27/2021   Plantar fasciitis 09/27/2021   Essential hypertension 09/27/2021   Type 2 diabetes mellitus ([place]) 04/06/2021   Vitamin D deficiency 10/05/2020   Pain in right elbow 11/13/2019   Impingement syndrome of right shoulder 10/29/2019   Nontraumatic incomplete tear of right rotator cuff 10/29/2019   Gastric nodule    Primary hypothyroidism 01/26/2017   Hyperlipidemia 01/26/2017   Bee sting allergy 05/14/2016   Idiopathic urticaria 09/29/2015    Past Surgical History:  Procedure Laterality Date   ADENOIDECTOMY  02/17/2005   CESAREAN SECTION  07/08/2003   ESOPHAGOGASTRODUODENOSCOPY (EGD) WITH PROPOFOL N/A  06/19/2019   Procedure: ESOPHAGOGASTRODUODENOSCOPY (EGD) WITH PROPOFOL;  Surgeon: Milus Banister, MD;  Location: [facility];  Service: Gastroenterology;  Laterality: N/A;   EUS N/A 06/19/2019   Procedure: UPPER ENDOSCOPIC ULTRASOUND (EUS) RADIAL;  Surgeon: Milus Banister, MD;  Location: [facility];  Service: Gastroenterology;  Laterality: N/A;   EVALUATION UNDER ANESTHESIA WITH HEMORRHOIDECTOMY N/A 01/14/2013   Procedure: EXAM UNDER ANESTHESIA WITH HEMORRHOIDECTOMY POSSIBLE BANDING;  Surgeon: Madilyn Hook, DO;  Location: [facility];  Service: General;  Laterality: N/A;   fibroids removed     LAPAROSCOPIC TOTAL HYSTERECTOMY  10/29/2006   MYOMECTOMY  12/18/2001   x 4   NASAL TURBINATE REDUCTION Bilateral 02/07/2005   inferior and removed adenoids   ROTATOR CUFF REPAIR Right 2020   TONSILLECTOMY AND ADENOIDECTOMY  age 1   TUBAL LIGATION  07/08/2003    OB History   No obstetric history on file.      Home Medications    Prior to Admission medications   Medication Sig Start Date End Date Taking? Authorizing Provider  amoxicillin (AMOXIL) 875 MG tablet Take 1 tablet (875 mg total) by mouth 2 (two) times daily for 10 days. 08/26/22 09/05/22 Yes Levester Waldridge, Michele Rockers, FNP  albuterol (VENTOLIN HFA) 108 (90 Base) MCG/ACT inhaler Inhale 2 puffs into the lungs every 4 (four) hours as needed for wheezing or shortness of breath. 07/01/21   Dutch Quint B, FNP  amLODipine (NORVASC) 10 MG tablet Take  1 tablet (10 mg total) by mouth daily. 09/27/21   Haydee Salter, MD  atorvastatin (LIPITOR) 20 MG tablet Take 1 tablet (20 mg total) by mouth daily. 09/27/21   Haydee Salter, MD  benzonatate (TESSALON) 100 MG capsule Take 1 capsule (100 mg total) by mouth 3 (three) times daily as needed for cough. 07/21/22   Quintella Reichert, MD  cetirizine (ZYRTEC) 10 MG tablet Take 1 tablet (10 mg total) by mouth daily. 06/29/21   Kennyth Arnold, FNP  EPINEPHrine (EPIPEN 2-PAK) 0.3 mg/0.3 mL IJ SOAJ  injection Inject 0.3 mLs (0.3 mg total) into the muscle as needed (allergic reaction). 05/15/16   Whiteheart, Cristal Ford, NP  ergocalciferol (VITAMIN D2) 1.25 MG (50000 UT) capsule Take 1 capsule by mouth every 7 (seven) days. 08/27/20   [provider]  estradiol (ESTRACE) 0.5 MG tablet Take 1 tablet (0.5 mg total) by mouth daily. 12/28/21   Haydee Salter, MD  fluticasone [facility]) 50 MCG/ACT nasal spray SHAKE LIQUID AND USE 2 SPRAYS IN [facility] NOSTRIL DAILY 04/22/20   Cirigliano, Mary K, DO  furosemide (LASIX) 20 MG tablet TAKE 1 TABLET BY MOUTH DAILY AS NEEDED 10/19/20   Ronnald Nian, DO  ibuprofen (ADVIL) 600 MG tablet  11/25/20   [provider]  Lancets (ONETOUCH DELICA PLUS [id number]) [facility] daily. as directed 08/03/21   [provider]  metFORMIN (GLUCOPHAGE-XR) 500 MG 24 hr tablet Take by mouth. 08/16/21   [provider]  naproxen (NAPROSYN) 375 MG tablet Take 1 tablet (375 mg total) by mouth 2 (two) times daily. 07/29/19   Palumbo, April, MD  ondansetron (ZOFRAN) 4 MG tablet Take 1 tablet (4 mg total) by mouth every 8 (eight) hours as needed for nausea or vomiting. 11/06/19   Aundra Dubin, PA-C  ONETOUCH VERIO test strip daily. 08/03/21   [provider]  pantoprazole (PROTONIX) 40 MG tablet Take 1 tablet (40 mg total) by mouth 2 (two) times daily. 05/19/22   Cirigliano, Vito V, DO  sucralfate (CARAFATE) 1 g tablet Take 1 tablet (1 g total) by mouth 4 (four) times daily -  with meals and at bedtime. 03/17/22   Haydee Salter, MD  TIROSINT 200 MCG CAPS Take by mouth. 08/16/21   [provider]  TIROSINT 25 MCG CAPS Take 1 capsule by mouth daily. 08/17/21   [provider]    Family History Family History  Problem Relation Age of Onset   Diabetes Mother    Fibroids Mother        uterine    Dementia Mother    Pancreatic cancer Father    Cancer Maternal Aunt        Breast   Breast cancer Maternal Aunt        late 63s    Cancer Maternal Aunt        Liver   Cancer Maternal Aunt        Throat   Heart disease Maternal Uncle        65s   Colon cancer Paternal Uncle    Clotting disorder Maternal Grandmother    Diabetes Maternal Grandmother    Liver cancer Maternal Grandfather    Diabetes Maternal Grandfather    Pancreatic cancer Paternal Grandmother    Diabetes Paternal Grandmother    Stomach cancer Paternal Grandfather    Diabetes Paternal Grandfather    Breast cancer Cousin        early 30s   Esophageal cancer Neg Hx  Social History Social History   Tobacco Use   Smoking status: Never   Smokeless tobacco: Never  Vaping Use   Vaping Use: Never used  Substance Use Topics   Alcohol use: Yes    Comment: socially   Drug use: No     Allergies   Ciprofloxacin, Quinolones, and Bee venom   Review of Systems Review of Systems Per HPI  Physical Exam Triage Vital Signs ED Triage Vitals  Enc Vitals Group     BP 08/26/22 1406 125/82     Pulse Rate 08/26/22 1406 75     Resp 08/26/22 1406 20     Temp 08/26/22 1406 98 F (36.7 C)     Temp Source 08/26/22 1406 Oral     SpO2 08/26/22 1406 96 %     Weight --      Height --      Head Circumference --      Peak Flow --      Pain Score 08/26/22 1409 0     Pain Loc --      Pain Edu? --      Excl. in [place]? --    No data found.  Updated Vital Signs BP 125/82 (BP Location: Left Arm)   Pulse 75   Temp 98 F (36.7 C) (Oral)   Resp 20   SpO2 96%   Visual Acuity Right Eye Distance:   Left Eye Distance:   Bilateral Distance:    Right Eye Near:   Left Eye Near:    Bilateral Near:     Physical Exam Constitutional:      General: She is not in acute distress.    Appearance: Normal appearance. She is not toxic-appearing or diaphoretic.  HENT:     Head: Normocephalic and atraumatic.     Right Ear: Ear canal normal. No drainage, swelling or tenderness. A middle ear effusion is present. Tympanic membrane is erythematous. Tympanic  membrane is not perforated or bulging.     Left Ear: Tympanic membrane and ear canal normal.     Nose: Congestion present.     Mouth/Throat:     Mouth: Mucous membranes are moist.     Pharynx: No posterior oropharyngeal erythema.  Eyes:     Extraocular Movements: Extraocular movements intact.     Conjunctiva/sclera: Conjunctivae normal.     Pupils: Pupils are equal, round, and reactive to light.  Cardiovascular:     Rate and Rhythm: Normal rate and regular rhythm.     Pulses: Normal pulses.     Heart sounds: Normal heart sounds.  Pulmonary:     Effort: Pulmonary effort is normal. No respiratory distress.     Breath sounds: Normal breath sounds. No stridor. No wheezing, rhonchi or rales.  Abdominal:     General: Abdomen is flat. Bowel sounds are normal.     Palpations: Abdomen is soft.  Musculoskeletal:        General: Normal range of motion.     Cervical back: Normal range of motion.  Skin:    General: Skin is warm and dry.  Neurological:     General: No focal deficit present.     Mental Status: She is alert and oriented to person, place, and time. Mental status is at baseline.  Psychiatric:        Mood and Affect: Mood normal.        Behavior: Behavior normal.      UC Treatments / Results  Labs (all labs ordered are  listed, but only abnormal results are displayed) Labs Reviewed  RESP PANEL BY RT-PCR (FLU A&B, COVID) [id number]    EKG   Radiology No results found.  Procedures Procedures (including critical care time)  Medications Ordered in UC Medications - No data to display  Initial Impression / Assessment and Plan / UC Course  I have reviewed the triage vital signs and the nursing notes.  Pertinent labs & imaging results that were available during my care of the patient were reviewed by me and considered in my medical decision making (see chart for details).     Patient presents with symptoms likely from a viral upper respiratory infection. Differential  includes bacterial pneumonia, sinusitis, allergic rhinitis, covid 19, flu, RSV. Do not suspect underlying cardiopulmonary process. Symptoms seem unlikely related to ACS, CHF or COPD exacerbations, pneumonia, pneumothorax. Patient is nontoxic appearing and not in need of emergent medical intervention.  COVID and flu test pending.  Do not think chest imaging is necessary given no adventitious lung sounds on exam or signs of respiratory compromise.  Recommended symptom control with over the counter medications.  Suggested medications over-the-counter that are safe with high blood pressure.  Amoxicillin to treat right ear infection.  Return if symptoms fail to improve in 1-2 weeks or you develop shortness of breath, chest pain, severe headache. Patient states understanding and is agreeable.  Discharged with PCP followup.  Final Clinical Impressions(s) / UC Diagnoses   Final diagnoses:  Viral upper respiratory tract infection with cough  Other non-recurrent acute nonsuppurative otitis media of right ear  Exposure to the flu  Exposure to COVID-19 virus     Discharge Instructions      You have an ear infection which is being treated with antibiotic.  Also appears that you have a viral upper respiratory infection that should run its course and self resolve with symptomatic treatment as we discussed.  COVID and flu test are pending.  We will call if they are positive.  Follow-up if symptoms persist or worsen.    ED Prescriptions     Medication Sig Dispense Auth. Provider   amoxicillin (AMOXIL) 875 MG tablet Take 1 tablet (875 mg total) by mouth 2 (two) times daily for 10 days. 20 tablet [place], Michele Rockers, [place]      PDMP not reviewed this encounter.   Teodora Medici, [place] 08/26/22 1529

## 2022-08-27 LAB — RESP PANEL BY RT-PCR (FLU A&B, COVID) ARPGX2
Influenza A by PCR: POSITIVE — AB
Influenza B by PCR: NEGATIVE
SARS Coronavirus 2 by RT PCR: NEGATIVE

## 2022-09-05 ENCOUNTER — Encounter: Payer: Self-pay | Admitting: Emergency Medicine

## 2022-09-05 ENCOUNTER — Ambulatory Visit
Admission: EM | Admit: 2022-09-05 | Discharge: 2022-09-05 | Disposition: A | Discharge status: Home / Self Care | Payer: Federal, State, Local not specified - PPO | Attending: Internal Medicine | Admitting: Internal Medicine

## 2022-09-05 DIAGNOSIS — R053 Chronic cough: Secondary | ICD-10-CM | POA: Diagnosis not present

## 2022-09-05 DIAGNOSIS — J111 Influenza due to unidentified influenza virus with other respiratory manifestations: Secondary | ICD-10-CM

## 2022-09-05 MED ORDER — PREDNISONE 20 MG PO TABS: 40.0000 mg | ORAL_TABLET | Freq: Every day | ORAL | 0 refills | Status: AC

## 2022-09-05 MED ORDER — ALBUTEROL SULFATE HFA 108 (90 BASE) MCG/ACT IN AERS: 1.0000 | INHALATION_SPRAY | Freq: Four times a day (QID) | RESPIRATORY_TRACT | 0 refills | Status: DC | PRN

## 2022-09-05 MED ORDER — BENZONATATE 100 MG PO CAPS: 100.0000 mg | ORAL_CAPSULE | Freq: Three times a day (TID) | ORAL | 0 refills | Status: DC | PRN

## 2022-09-05 NOTE — ED Provider Notes (Signed)
Melanie Roberts    CSN: [id number] Arrival date & time: 09/05/22  1142      History   Chief Complaint Chief Complaint  Patient presents with   Cough   Shortness of Breath   Nasal Congestion    HPI Melanie Roberts is a 51 y.o. female.   Patient presents with persistent cough that has been present for about 2 weeks.  Patient was seen on 08/26/2022 and tested positive for influenza.  She reports that all upper respiratory symptoms have resolved but she is left with a dry cough.  Patient denies history of asthma or COPD and is not a smoker.  She denies any associated fevers.  Patient has taken over-the-counter cough medications with minimal improvement in symptoms.  She was also prescribed amoxicillin for right otitis media at previous urgent Roberts visit which she has taken completely.  She reports some intermittent shortness of breath but denies chest pain.  States that she called her primary Roberts doctor to attempt to get a refill on albuterol inhaler that she has used previously for acute illness but has not heard back from them.   Cough Shortness of Breath   Past Medical History:  Diagnosis Date   Allergy    Diabetes mellitus without complication ([place])    GERD (gastroesophageal reflux disease)    Headache(784.0)    migraines   Hemorrhoids 12/2012   Hypertension    Hypothyroidism    Seasonal allergies    UTI (urinary tract infection)    Wears partial dentures    upper and lower    Patient Active Problem List   Diagnosis Date Noted   Macromastia 03/17/2022   Thyroid nodule 09/27/2021   Gastroesophageal reflux disease 09/27/2021   Low back pain 09/27/2021   Pedal edema 09/27/2021   Plantar fasciitis 09/27/2021   Essential hypertension 09/27/2021   Type 2 diabetes mellitus ([place]) 04/06/2021   Vitamin D deficiency 10/05/2020   Pain in right elbow 11/13/2019   Impingement syndrome of right shoulder 10/29/2019   Nontraumatic incomplete tear of right  rotator cuff 10/29/2019   Gastric nodule    Primary hypothyroidism 01/26/2017   Hyperlipidemia 01/26/2017   Bee sting allergy 05/14/2016   Idiopathic urticaria 09/29/2015    Past Surgical History:  Procedure Laterality Date   ADENOIDECTOMY  02/17/2005   CESAREAN SECTION  07/08/2003   ESOPHAGOGASTRODUODENOSCOPY (EGD) WITH PROPOFOL N/A 06/19/2019   Procedure: ESOPHAGOGASTRODUODENOSCOPY (EGD) WITH PROPOFOL;  Surgeon: Milus Banister, MD;  Location: [facility];  Service: Gastroenterology;  Laterality: N/A;   EUS N/A 06/19/2019   Procedure: UPPER ENDOSCOPIC ULTRASOUND (EUS) RADIAL;  Surgeon: Milus Banister, MD;  Location: [facility];  Service: Gastroenterology;  Laterality: N/A;   EVALUATION UNDER ANESTHESIA WITH HEMORRHOIDECTOMY N/A 01/14/2013   Procedure: EXAM UNDER ANESTHESIA WITH HEMORRHOIDECTOMY POSSIBLE BANDING;  Surgeon: Madilyn Hook, DO;  Location: [facility];  Service: General;  Laterality: N/A;   fibroids removed     LAPAROSCOPIC TOTAL HYSTERECTOMY  10/29/2006   MYOMECTOMY  12/18/2001   x 4   NASAL TURBINATE REDUCTION Bilateral 02/07/2005   inferior and removed adenoids   ROTATOR CUFF REPAIR Right 2020   TONSILLECTOMY AND ADENOIDECTOMY  age 69   TUBAL LIGATION  07/08/2003    OB History   No obstetric history on file.      Home Medications    Prior to Admission medications   Medication Sig Start Date End Date Taking? Authorizing Provider  albuterol (VENTOLIN HFA) 108 (90 Base)  MCG/ACT inhaler Inhale 1-2 puffs into the lungs every 6 (six) hours as needed for wheezing or shortness of breath. 09/05/22  Yes Cooper Stamp, Hildred Alamin E, FNP  benzonatate (TESSALON) 100 MG capsule Take 1 capsule (100 mg total) by mouth every 8 (eight) hours as needed for cough. 09/05/22  Yes Nataki Mccrumb, Hildred Alamin E, FNP  predniSONE (DELTASONE) 20 MG tablet Take 2 tablets (40 mg total) by mouth daily for 5 days. 09/05/22 09/10/22 Yes Ora Bollig, Michele Rockers, FNP  amLODipine (NORVASC) 10 MG tablet Take 1  tablet (10 mg total) by mouth daily. 09/27/21   Haydee Salter, MD  atorvastatin (LIPITOR) 20 MG tablet Take 1 tablet (20 mg total) by mouth daily. 09/27/21   Haydee Salter, MD  cetirizine (ZYRTEC) 10 MG tablet Take 1 tablet (10 mg total) by mouth daily. 06/29/21   Kennyth Arnold, FNP  EPINEPHrine (EPIPEN 2-PAK) 0.3 mg/0.3 mL IJ SOAJ injection Inject 0.3 mLs (0.3 mg total) into the muscle as needed (allergic reaction). 05/15/16   Whiteheart, Cristal Ford, NP  ergocalciferol (VITAMIN D2) 1.25 MG (50000 UT) capsule Take 1 capsule by mouth every 7 (seven) days. 08/27/20   [provider]  estradiol (ESTRACE) 0.5 MG tablet Take 1 tablet (0.5 mg total) by mouth daily. 12/28/21   Haydee Salter, MD  fluticasone [facility]) 50 MCG/ACT nasal spray SHAKE LIQUID AND USE 2 SPRAYS IN [facility] NOSTRIL DAILY 04/22/20   Cirigliano, Mary K, DO  furosemide (LASIX) 20 MG tablet TAKE 1 TABLET BY MOUTH DAILY AS NEEDED 10/19/20   Ronnald Nian, DO  ibuprofen (ADVIL) 600 MG tablet  11/25/20   [provider]  Lancets (ONETOUCH DELICA PLUS [id number]) [place] daily. as directed 08/03/21   [provider]  metFORMIN (GLUCOPHAGE-XR) 500 MG 24 hr tablet Take by mouth. 08/16/21   [provider]  naproxen (NAPROSYN) 375 MG tablet Take 1 tablet (375 mg total) by mouth 2 (two) times daily. 07/29/19   Palumbo, April, MD  ondansetron (ZOFRAN) 4 MG tablet Take 1 tablet (4 mg total) by mouth every 8 (eight) hours as needed for nausea or vomiting. 11/06/19   Aundra Dubin, PA-C  ONETOUCH VERIO test strip daily. 08/03/21   [provider]  pantoprazole (PROTONIX) 40 MG tablet Take 1 tablet (40 mg total) by mouth 2 (two) times daily. 05/19/22   Cirigliano, Vito V, DO  sucralfate (CARAFATE) 1 g tablet Take 1 tablet (1 g total) by mouth 4 (four) times daily -  with meals and at bedtime. 03/17/22   Haydee Salter, MD  TIROSINT 200 MCG CAPS Take by mouth. 08/16/21   [provider]  TIROSINT 25 MCG  CAPS Take 1 capsule by mouth daily. 08/17/21   [provider]    Family History Family History  Problem Relation Age of Onset   Diabetes Mother    Fibroids Mother        uterine    Dementia Mother    Pancreatic cancer Father    Cancer Maternal Aunt        Breast   Breast cancer Maternal Aunt        late 80s   Cancer Maternal Aunt        Liver   Cancer Maternal Aunt        Throat   Heart disease Maternal Uncle        30s   Colon cancer Paternal Uncle    Clotting disorder Maternal Grandmother    Diabetes Maternal Grandmother  Liver cancer Maternal Grandfather    Diabetes Maternal Grandfather    Pancreatic cancer Paternal Grandmother    Diabetes Paternal Grandmother    Stomach cancer Paternal Grandfather    Diabetes Paternal Grandfather    Breast cancer Cousin        early 99s   Esophageal cancer Neg Hx     Social History Social History   Tobacco Use   Smoking status: Never   Smokeless tobacco: Never  Vaping Use   Vaping Use: Never used  Substance Use Topics   Alcohol use: Yes    Comment: socially   Drug use: No     Allergies   Ciprofloxacin, Quinolones, and Bee venom   Review of Systems Review of Systems Per HPI  Physical Exam Triage Vital Signs ED Triage Vitals  Enc Vitals Group     BP 09/05/22 1155 113/80     Pulse Rate 09/05/22 1155 76     Resp 09/05/22 1155 18     Temp 09/05/22 1156 98 F (36.7 C)     Temp src --      SpO2 09/05/22 1155 95 %     Weight --      Height --      Head Circumference --      Peak Flow --      Pain Score 09/05/22 1154 4     Pain Loc --      Pain Edu? --      Excl. in [place]? --    No data found.  Updated Vital Signs BP 113/80   Pulse 76   Temp 98 F (36.7 C)   Resp 18   SpO2 95%   Visual Acuity Right Eye Distance:   Left Eye Distance:   Bilateral Distance:    Right Eye Near:   Left Eye Near:    Bilateral Near:     Physical Exam Constitutional:      General: She is not in acute  distress.    Appearance: Normal appearance. She is not toxic-appearing or diaphoretic.  HENT:     Head: Normocephalic and atraumatic.     Right Ear: Tympanic membrane and ear canal normal.     Left Ear: Tympanic membrane and ear canal normal.     Nose: Nose normal.     Mouth/Throat:     Mouth: Mucous membranes are moist.     Pharynx: No posterior oropharyngeal erythema.  Eyes:     Extraocular Movements: Extraocular movements intact.     Conjunctiva/sclera: Conjunctivae normal.     Pupils: Pupils are equal, round, and reactive to light.  Cardiovascular:     Rate and Rhythm: Normal rate and regular rhythm.     Pulses: Normal pulses.     Heart sounds: Normal heart sounds.  Pulmonary:     Effort: Pulmonary effort is normal. No respiratory distress.     Breath sounds: Normal breath sounds. No stridor. No wheezing, rhonchi or rales.  Abdominal:     General: Abdomen is flat. Bowel sounds are normal.     Palpations: Abdomen is soft.  Musculoskeletal:        General: Normal range of motion.     Cervical back: Normal range of motion.  Skin:    General: Skin is warm and dry.  Neurological:     General: No focal deficit present.     Mental Status: She is alert and oriented to person, place, and time. Mental status is at baseline.  Psychiatric:  Mood and Affect: Mood normal.        Behavior: Behavior normal.        Thought Content: Thought content normal.        Judgment: Judgment normal.      UC Treatments / Results  Labs (all labs ordered are listed, but only abnormal results are displayed) Labs Reviewed - No data to display  EKG   Radiology No results found.  Procedures Procedures (including critical Roberts time)  Medications Ordered in UC Medications - No data to display  Initial Impression / Assessment and Plan / UC Course  I have reviewed the triage vital signs and the nursing notes.  Pertinent labs & imaging results that were available during my Roberts of the  patient were reviewed by me and considered in my medical decision making (see chart for details).     Patient tested positive for influenza at previous urgent Roberts visit and has persistent cough.  There are no adventitious lung sounds on exam or signs of respiratory compromise so do not think that chest imaging is necessary.  Patient will most likely benefit from albuterol inhaler as well as prednisone steroid to decrease inflammation and open up airways.  No obvious contraindications to prednisone noted on exam but patient does have a history of diabetes.  Last A1c was 6.5 so prednisone should be safe.  Patient has glucose monitor at home and was advised to monitor blood sugar very closely while taking prednisone.  She voiced understanding.  Patient was advised to follow-up if symptoms persist or worsen.  Patient verbalized understanding and was agreeable with plan Final Clinical Impressions(s) / UC Diagnoses   Final diagnoses:  Persistent cough  Influenza     Discharge Instructions      You most likely have a form of bronchitis given persistent cough and recent diagnosis of the flu.  I have prescribed you several different medications to help alleviate these including a refill on your inhaler.  I recommend that you follow-up if any symptoms persist or worsen.    ED Prescriptions     Medication Sig Dispense Auth. Provider   benzonatate (TESSALON) 100 MG capsule Take 1 capsule (100 mg total) by mouth every 8 (eight) hours as needed for cough. 21 capsule [place], [place] E, [place]   albuterol (VENTOLIN HFA) 108 (90 Base) MCG/ACT inhaler Inhale 1-2 puffs into the lungs every 6 (six) hours as needed for wheezing or shortness of breath. 1 each [place], Hildred Alamin E, [place]   predniSONE (DELTASONE) 20 MG tablet Take 2 tablets (40 mg total) by mouth daily for 5 days. 10 tablet Teodora Medici, [place]      PDMP not reviewed this encounter.   Teodora Medici,  09/05/22 1215

## 2022-09-05 NOTE — Discharge Instructions (Signed)
You most likely have a form of bronchitis given persistent cough and recent diagnosis of the flu.  I have prescribed you several different medications to help alleviate these including a refill on your inhaler.  I recommend that you follow-up if any symptoms persist or worsen.

## 2022-09-05 NOTE — ED Triage Notes (Signed)
Pt is present today with c/o cough/ nasal congestion and SOB x2 weeks

## 2022-09-20 ENCOUNTER — Other Ambulatory Visit: Payer: Self-pay | Admitting: Family Medicine

## 2022-09-20 DIAGNOSIS — I10 Essential (primary) hypertension: Secondary | ICD-10-CM

## 2022-09-20 NOTE — Telephone Encounter (Signed)
VM due to mailbox is full.  Will try later. Dm/cma

## 2022-09-21 NOTE — Telephone Encounter (Signed)
Scheduled for 09/27/22 '@10'$ :20 am. Dm/cma

## 2022-09-21 NOTE — Telephone Encounter (Signed)
VM due to mailbox is full.  Will try later. Dm/cma

## 2022-09-27 ENCOUNTER — Ambulatory Visit: Payer: Federal, State, Local not specified - PPO | Admitting: Family Medicine

## 2022-09-27 ENCOUNTER — Encounter: Payer: Self-pay | Admitting: Family Medicine

## 2022-09-27 VITALS — BP 128/84 | HR 72 | Temp 97.1°F | Ht 67.0 in | Wt 240.8 lb

## 2022-09-27 DIAGNOSIS — E782 Mixed hyperlipidemia: Secondary | ICD-10-CM

## 2022-09-27 DIAGNOSIS — I10 Essential (primary) hypertension: Secondary | ICD-10-CM

## 2022-09-27 DIAGNOSIS — E038 Other specified hypothyroidism: Secondary | ICD-10-CM | POA: Diagnosis not present

## 2022-09-27 DIAGNOSIS — F439 Reaction to severe stress, unspecified: Secondary | ICD-10-CM

## 2022-09-27 DIAGNOSIS — E119 Type 2 diabetes mellitus without complications: Secondary | ICD-10-CM | POA: Diagnosis not present

## 2022-09-27 DIAGNOSIS — E063 Autoimmune thyroiditis: Secondary | ICD-10-CM

## 2022-09-27 DIAGNOSIS — Z9189 Other specified personal risk factors, not elsewhere classified: Secondary | ICD-10-CM

## 2022-09-27 NOTE — Progress Notes (Signed)
[facility] [address] [place] [address] Dept: [phone] Dept Fax: [phone]  Chronic Care Office Visit  Subjective:    Patient ID: Melanie Roberts, female    DOB: December 05, 1970, 51 y.o..   MRN: [id number]  Chief Complaint  Patient presents with   Follow-up    F/u meds. C/o having watery eyes, has been taking Cetazine and eye drops. Wants a sleep study.     History of Present Illness:  Patient is in today for reassessment of chronic medical issues.  Melanie Roberts has a history of hypothyroidism. She is managed by Melanie Roberts (endocrinology NP) and is currently on Tirosint (levothyroxine) 225 mcg daily. She had not been taking her medication in quite a while, so her last TSH in October was high. She also has a history of thyroid nodules. Melanie Roberts had wanted to follow-up ultrasound, but decided to delay this until Melanie Roberts TSH is more normal.   Melanie Roberts has a history of Type 2 diabetes. She is managed on metformin XR 500 mg daily. Her recent A1c was 6.5% (07/24/2022)   Melanie Roberts has a history of  hypertension and is managed with amlodipine 10 mg daily.   Melanie Roberts has hyperlipidemia. She is managed on atorvastatin 20 mg daily.    Melanie Roberts notes she is under considerable stressors at home. Her mother has developed dementia and requires more care. Her son has untreated bipolar disorder and has a very chaotic life. She is trying to establish appropriate barriers with him, but admits it is difficult.  Melanie Roberts notes a past history of snoring. Melanie Roberts had plans to refer her for a sleep study, but this never occurred. She admits to people having heard her stop breathing in her sleep. She also has non-restorative sleep and some daytime hypersomnolence.  Past Medical History: Patient Active Problem List   Diagnosis Date Noted   Macromastia 03/17/2022   Thyroid nodule 09/27/2021    Gastroesophageal reflux disease 09/27/2021   Low back pain 09/27/2021   Pedal edema 09/27/2021   Plantar fasciitis 09/27/2021   Essential hypertension 09/27/2021   Type 2 diabetes mellitus ([place]) 04/06/2021   Vitamin D deficiency 10/05/2020   Pain in right elbow 11/13/2019   Impingement syndrome of right shoulder 10/29/2019   Nontraumatic incomplete tear of right rotator cuff 10/29/2019   Gastric nodule    Primary hypothyroidism 01/26/2017   Hyperlipidemia 01/26/2017   Bee sting allergy 05/14/2016   Idiopathic urticaria 09/29/2015   Past Surgical History:  Procedure Laterality Date   ADENOIDECTOMY  02/17/2005   CESAREAN SECTION  07/08/2003   ESOPHAGOGASTRODUODENOSCOPY (EGD) WITH PROPOFOL N/A 06/19/2019   Procedure: ESOPHAGOGASTRODUODENOSCOPY (EGD) WITH PROPOFOL;  Surgeon: Milus Banister, MD;  Location: [facility];  Service: Gastroenterology;  Laterality: N/A;   EUS N/A 06/19/2019   Procedure: UPPER ENDOSCOPIC ULTRASOUND (EUS) RADIAL;  Surgeon: Milus Banister, MD;  Location: [facility];  Service: Gastroenterology;  Laterality: N/A;   EVALUATION UNDER ANESTHESIA WITH HEMORRHOIDECTOMY N/A 01/14/2013   Procedure: EXAM UNDER ANESTHESIA WITH HEMORRHOIDECTOMY POSSIBLE BANDING;  Surgeon: Madilyn Hook, DO;  Location: [facility];  Service: General;  Laterality: N/A;   fibroids removed     LAPAROSCOPIC TOTAL HYSTERECTOMY  10/29/2006   MYOMECTOMY  12/18/2001   x 4   NASAL TURBINATE REDUCTION Bilateral 02/07/2005   inferior and removed adenoids   ROTATOR CUFF REPAIR Right 2020   TONSILLECTOMY AND ADENOIDECTOMY  age 1   TUBAL  LIGATION  07/08/2003   Family History  Problem Relation Age of Onset   Diabetes Mother    Fibroids Mother        uterine    Dementia Mother    Pancreatic cancer Father    Cancer Maternal Aunt        Breast   Breast cancer Maternal Aunt        late 46s   Cancer Maternal Aunt        Liver   Cancer Maternal Aunt        Throat   Heart  disease Maternal Uncle        44s   Colon cancer Paternal Uncle    Clotting disorder Maternal Grandmother    Diabetes Maternal Grandmother    Liver cancer Maternal Grandfather    Diabetes Maternal Grandfather    Pancreatic cancer Paternal Grandmother    Diabetes Paternal Grandmother    Stomach cancer Paternal Grandfather    Diabetes Paternal Grandfather    Breast cancer Cousin        early 28s   Esophageal cancer Neg Hx    Outpatient Medications Prior to Visit  Medication Sig Dispense Refill   albuterol (VENTOLIN HFA) 108 (90 Base) MCG/ACT inhaler Inhale 1-2 puffs into the lungs every 6 (six) hours as needed for wheezing or shortness of breath. 1 each 0   amLODipine (NORVASC) 10 MG tablet TAKE 1 TABLET(10 MG) BY MOUTH DAILY 30 tablet 0   atorvastatin (LIPITOR) 20 MG tablet Take 1 tablet (20 mg total) by mouth daily. 90 tablet 3   cetirizine (ZYRTEC) 10 MG tablet Take 1 tablet (10 mg total) by mouth daily. 90 tablet 0   EPINEPHrine (EPIPEN 2-PAK) 0.3 mg/0.3 mL IJ SOAJ injection Inject 0.3 mLs (0.3 mg total) into the muscle as needed (allergic reaction). 1 Device 1   ergocalciferol (VITAMIN D2) 1.25 MG (50000 UT) capsule Take 1 capsule by mouth every 7 (seven) days.     estradiol (ESTRACE) 0.5 MG tablet Take 1 tablet (0.5 mg total) by mouth daily. 90 tablet 3   fluticasone (FLONASE) 50 MCG/ACT nasal spray SHAKE LIQUID AND USE 2 SPRAYS IN EACH NOSTRIL DAILY 16 g 2   furosemide (LASIX) 20 MG tablet TAKE 1 TABLET BY MOUTH DAILY AS NEEDED 90 tablet 2   ibuprofen (ADVIL) 600 MG tablet      Lancets (ONETOUCH DELICA PLUS [id number]) MISC daily. as directed     metFORMIN (GLUCOPHAGE-XR) 500 MG 24 hr tablet Take by mouth.     naproxen (NAPROSYN) 375 MG tablet Take 1 tablet (375 mg total) by mouth 2 (two) times daily. 20 tablet 0   ondansetron (ZOFRAN) 4 MG tablet Take 1 tablet (4 mg total) by mouth every 8 (eight) hours as needed for nausea or vomiting. 40 tablet 0   ONETOUCH VERIO test strip  daily.     pantoprazole (PROTONIX) 40 MG tablet Take 1 tablet (40 mg total) by mouth 2 (two) times daily. 60 tablet 3   sucralfate (CARAFATE) 1 g tablet Take 1 tablet (1 g total) by mouth 4 (four) times daily -  with meals and at bedtime. 48 tablet 0   TIROSINT 200 MCG CAPS Take by mouth.     TIROSINT 25 MCG CAPS Take 1 capsule by mouth daily.     benzonatate (TESSALON) 100 MG capsule Take 1 capsule (100 mg total) by mouth every 8 (eight) hours as needed for cough. 21 capsule 0   No facility-administered medications prior  to visit.   Allergies  Allergen Reactions   Ciprofloxacin Anaphylaxis   Quinolones Anaphylaxis   Bee Venom Swelling and Rash   Objective:   Today's Vitals   09/27/22 1007  BP: 128/84  Pulse: 72  Temp: (!) 97.1 F (36.2 C)  TempSrc: Temporal  SpO2: 99%  Weight: 240 lb 12.8 oz (109.2 kg)  Height: '5\' 7"'$  (1.702 m)   Body mass index is 37.71 kg/m.   General: Well developed, well nourished. No acute distress. Psych: Alert and oriented. Normal mood and affect.  Health Maintenance Due  Topic Date Due   OPHTHALMOLOGY EXAM  02/23/2021   HEMOGLOBIN A1C  03/28/2022   Lab Results  Ref Range & Units 2 mo ago   TSH 0.45 - 5.00 UIU/ML 61.73 High     Ref Range & Units 2 mo ago   FREE T4 0.6 - 1.3 NG/DL <0.6 Low     Ref Range & Units 2 mo ago   Anti-TPO (Antibody) <9 IU/mL 140 High     Ref Range & Units 2 mo ago   HEMOGLOBIN A1C <5.8 % 6.5 High     Ref Range & Units 2 mo ago   Urine Albumin Conc MG/L 10  U Creatinine Concentrate MG/DL 83  U Albumin/G Creatinine 0 - 30 mg/G CREATININE 12  Comment:  Normal: < 30 mcgr albumin/gr Cr  Moderately increased: 30 - 299  Clinical albuminuria: >= 300  U Interval  Random  U Volume ML 3  U Protein Concentrate MG/DL 7   STOP-Bang Score for Sleep Apnea Screening  Patient Self-Reported Questions         Score Do you snore loudly? (louder than  1  talking or sufficiently loud to be  heard through doors)  Do  you feel often feel tired, fatigued, or 1  sleepy during the daytime?  Has anyone observed you stop breathing 1  during sleep?  Do you have (or are you being treated for) 1  high blood pressure?  Clinical Information          Score BMI>35 kg/m2     1  Age > 50 years    1  Neck circumference > 40 cm   1  Gender (female)     0   Total      7  A score <3 indicates a low risk of sleep apnea.  Assessment & Plan:   1. Type 2 diabetes mellitus without complication, without long-term current use of insulin (HCC) Diabetes remains at goal. Continue metformin XR 500 mg daily.  2. Essential hypertension Blood pressure is at goal. Continue amlodipine 10 mg daily.  3. Primary hypothyroidism Melanie Roberts is back on thyroid replacement. She will follow-up with Melanie Roberts for monitoring. Continue levothyroxine 225 mcg daily.  4. Mixed hyperlipidemia Continue atorvastatin 20 mg dialy  5. Stress at home I spent time allowing Melanie Roberts to vent about her stressors. She does appear to be taking appropriate steps to avoid co-dependency with her on's issues.  6. At risk for sleep apnea Melanie Roberts is at risk for having sleep apnea. I will refer her for an assessment.  - Ambulatory referral to Sleep Studies   Return in about 3 months (around 12/27/2022) for Reassessment.   Haydee Salter, MD

## 2022-10-20 ENCOUNTER — Other Ambulatory Visit: Payer: Self-pay | Admitting: Family Medicine

## 2022-10-20 DIAGNOSIS — I10 Essential (primary) hypertension: Secondary | ICD-10-CM

## 2022-10-30 ENCOUNTER — Other Ambulatory Visit: Payer: Self-pay

## 2022-10-30 ENCOUNTER — Emergency Department (HOSPITAL_BASED_OUTPATIENT_CLINIC_OR_DEPARTMENT_OTHER): Payer: Federal, State, Local not specified - PPO | Admitting: Radiology

## 2022-10-30 ENCOUNTER — Emergency Department (HOSPITAL_BASED_OUTPATIENT_CLINIC_OR_DEPARTMENT_OTHER)
Admission: EM | Admit: 2022-10-30 | Discharge: 2022-10-30 | Disposition: A | Discharge status: Home / Self Care | Payer: Federal, State, Local not specified - PPO | Attending: Emergency Medicine | Admitting: Emergency Medicine

## 2022-10-30 DIAGNOSIS — M7989 Other specified soft tissue disorders: Secondary | ICD-10-CM | POA: Insufficient documentation

## 2022-10-30 DIAGNOSIS — M79644 Pain in right finger(s): Secondary | ICD-10-CM | POA: Diagnosis not present

## 2022-10-30 NOTE — ED Triage Notes (Signed)
Patient presents to the ED from home, [facility], ambulatory, states this is her 3rd visit for the same complaint. States she has had increasing swelling in her L thumb knuckle for the past "couple weeks"

## 2022-10-30 NOTE — ED Provider Notes (Signed)
[facility] Provider Note   CSN: [id number] Arrival date & time: 10/30/22  1906     History  Chief Complaint  Patient presents with   Finger Injury    Melanie Roberts is a 52 y.o. female presents to the ED complaining of right thumb swelling and pain over her knuckle that has been increasing in size over the past couple of weeks.  She denies known injury.  She works for the [employer] and does repetitive motion tasks.  Denies fever, increased warmth to the joint, redness to the joint or thumb.        Home Medications Prior to Admission medications   Medication Sig Start Date End Date Taking? Authorizing Provider  albuterol (VENTOLIN HFA) 108 (90 Base) MCG/ACT inhaler Inhale 1-2 puffs into the lungs every 6 (six) hours as needed for wheezing or shortness of breath. 09/05/22   Teodora Medici, FNP  amLODipine (NORVASC) 10 MG tablet TAKE 1 TABLET(10 MG) BY MOUTH DAILY 10/20/22   Haydee Salter, MD  atorvastatin (LIPITOR) 20 MG tablet Take 1 tablet (20 mg total) by mouth daily. 09/27/21   Haydee Salter, MD  cetirizine (ZYRTEC) 10 MG tablet Take 1 tablet (10 mg total) by mouth daily. 06/29/21   Kennyth Arnold, FNP  EPINEPHrine (EPIPEN 2-PAK) 0.3 mg/0.3 mL IJ SOAJ injection Inject 0.3 mLs (0.3 mg total) into the muscle as needed (allergic reaction). 05/15/16   Whiteheart, Cristal Ford, NP  ergocalciferol (VITAMIN D2) 1.25 MG (50000 UT) capsule Take 1 capsule by mouth every 7 (seven) days. 08/27/20   [provider]  estradiol (ESTRACE) 0.5 MG tablet Take 1 tablet (0.5 mg total) by mouth daily. 12/28/21   Haydee Salter, MD  fluticasone [facility]) 50 MCG/ACT nasal spray SHAKE LIQUID AND USE 2 SPRAYS IN [facility] NOSTRIL DAILY 04/22/20   Cirigliano, Mary K, DO  furosemide (LASIX) 20 MG tablet TAKE 1 TABLET BY MOUTH DAILY AS NEEDED 10/19/20   Ronnald Nian, DO  ibuprofen (ADVIL) 600 MG tablet  11/25/20   [provider]  Lancets (ONETOUCH DELICA PLUS  [id number]) [place] daily. as directed 08/03/21   [provider]  metFORMIN (GLUCOPHAGE-XR) 500 MG 24 hr tablet Take by mouth. 08/16/21   [provider]  naproxen (NAPROSYN) 375 MG tablet Take 1 tablet (375 mg total) by mouth 2 (two) times daily. 07/29/19   Palumbo, April, MD  ondansetron (ZOFRAN) 4 MG tablet Take 1 tablet (4 mg total) by mouth every 8 (eight) hours as needed for nausea or vomiting. 11/06/19   Aundra Dubin, PA-C  ONETOUCH VERIO test strip daily. 08/03/21   [provider]  pantoprazole (PROTONIX) 40 MG tablet Take 1 tablet (40 mg total) by mouth 2 (two) times daily. 05/19/22   Cirigliano, Vito V, DO  sucralfate (CARAFATE) 1 g tablet Take 1 tablet (1 g total) by mouth 4 (four) times daily -  with meals and at bedtime. 03/17/22   Haydee Salter, MD  TIROSINT 200 MCG CAPS Take by mouth. 08/16/21   [provider]  TIROSINT 25 MCG CAPS Take 1 capsule by mouth daily. 08/17/21   [provider]      Allergies    Ciprofloxacin, Quinolones, and Bee venom    Review of Systems   Review of Systems  Physical Exam Updated Vital Signs BP (!) 130/101 (BP Location: Right Arm)   Pulse 76   Temp 98.3 F (36.8 C) (Oral)   Resp 18  Ht '5\' 7"'$  (1.702 m)   Wt 104.8 kg   SpO2 95%   BMI 36.18 kg/m  Physical Exam Vitals and nursing note reviewed.  Constitutional:      General: She is not in acute distress.    Appearance: Normal appearance. She is not ill-appearing or diaphoretic.  Pulmonary:     Effort: Pulmonary effort is normal.  Musculoskeletal:     Right hand: Swelling and bony tenderness present. No deformity or tenderness. Normal capillary refill. Normal pulse.     Comments: Mild swelling and bony tenderness of the MCP joint of the right thumb.  Does appear larger on the right hand vs the left.  Normal ROM of thumb, but patient does experience increased pain with movement.  Neurovascularly intact.  No gross deformity.    Neurological:      Mental Status: She is alert. Mental status is at baseline.  Psychiatric:        Mood and Affect: Mood normal.        Behavior: Behavior normal.     ED Results / Procedures / Treatments   Labs (all labs ordered are listed, but only abnormal results are displayed) Labs Reviewed - No data to display  EKG None  Radiology DG Finger Thumb Right  Result Date: 10/30/2022 CLINICAL DATA:  Increasing swelling and left thumb and pain EXAM: RIGHT THUMB 2+V COMPARISON:  None Available. FINDINGS: There is no evidence of fracture or dislocation. There is no evidence of arthropathy or other focal bone abnormality. Soft tissues are unremarkable. IMPRESSION: Negative. Electronically Signed   By: Placido Sou M.D.   On: 10/30/2022 22:14    Procedures Procedures    Medications Ordered in ED Medications - No data to display  ED Course/ Medical Decision Making/ A&P                           Medical Decision Making Amount and/or Complexity of Data Reviewed Radiology: ordered.   Patient presents to ED with complaint of right thumb joint swelling and pain with movement.  She reports it has been progressively worsening over the last 2 weeks.  She does not have any known joint disorders.  Denies redness or increased warmth.  Denies known injury to the thumb.  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   I ordered and interpreted x-ray imaging of right thumb which showed no acute fracture, joint effusion, or dislocation of the right thumb MCP joint.  No evidence of soft tissue swelling.  There are no obvious causes for patient's symptoms.  No emergent causes requiring ED treatment at this time.  Will advise patient to follow-up with primary care provider regarding her symptoms.   The patient has been appropriately medically screened and/or stabilized in the ED. I have low suspicion for any other emergent medical condition which would require further screening, evaluation or  treatment in the ED or require inpatient management. At time of discharge the patient is hemodynamically stable and in no acute distress. I have discussed work-up results and diagnosis with patient and answered all questions. Patient is agreeable with discharge plan. We discussed strict return precautions for returning to the emergency department and they verbalized understanding.           Final Clinical Impression(s) / ED Diagnoses Final diagnoses:  Pain of right thumb    Rx / DC Orders ED Discharge Orders     None  Pat Kocher, [place] 10/30/22 2227    Fredia Sorrow, MD 11/03/22 2036

## 2022-10-30 NOTE — Discharge Instructions (Addendum)
Thank you for allowing me to be part of your care today.  Your x-ray was negative for dislocation, arthritic changes, or joint swelling to your thumb.  I recommend following up with your primary care provider regarding your symptoms.  You may use heat or ice for swelling.  I recommend taking ibuprofen or naproxen as needed for joint pain.

## 2022-11-02 ENCOUNTER — Encounter (HOSPITAL_BASED_OUTPATIENT_CLINIC_OR_DEPARTMENT_OTHER): Payer: Self-pay

## 2022-11-02 ENCOUNTER — Other Ambulatory Visit: Payer: Self-pay

## 2022-11-02 ENCOUNTER — Emergency Department (HOSPITAL_BASED_OUTPATIENT_CLINIC_OR_DEPARTMENT_OTHER): Payer: Federal, State, Local not specified - PPO | Admitting: Radiology

## 2022-11-02 DIAGNOSIS — Z7984 Long term (current) use of oral hypoglycemic drugs: Secondary | ICD-10-CM | POA: Insufficient documentation

## 2022-11-02 DIAGNOSIS — Y990 Civilian activity done for income or pay: Secondary | ICD-10-CM | POA: Insufficient documentation

## 2022-11-02 DIAGNOSIS — W228XXA Striking against or struck by other objects, initial encounter: Secondary | ICD-10-CM | POA: Insufficient documentation

## 2022-11-02 DIAGNOSIS — E039 Hypothyroidism, unspecified: Secondary | ICD-10-CM | POA: Diagnosis not present

## 2022-11-02 DIAGNOSIS — E119 Type 2 diabetes mellitus without complications: Secondary | ICD-10-CM | POA: Diagnosis not present

## 2022-11-02 DIAGNOSIS — S299XXA Unspecified injury of thorax, initial encounter: Secondary | ICD-10-CM | POA: Insufficient documentation

## 2022-11-02 NOTE — ED Triage Notes (Signed)
Patient arrives to ED POV c/o Left side Rib Pain. 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 No other complaints at this time. Pt A/O x4.

## 2022-11-03 ENCOUNTER — Emergency Department (HOSPITAL_BASED_OUTPATIENT_CLINIC_OR_DEPARTMENT_OTHER)
Admission: EM | Admit: 2022-11-03 | Discharge: 2022-11-03 | Disposition: A | Discharge status: Home / Self Care | Attending: Emergency Medicine | Admitting: Emergency Medicine

## 2022-11-03 DIAGNOSIS — S20212A Contusion of left front wall of thorax, initial encounter: Secondary | ICD-10-CM

## 2022-11-03 MED ORDER — TRAMADOL HCL 50 MG PO TABS: 50.0000 mg | ORAL_TABLET | Freq: Four times a day (QID) | ORAL | 0 refills | Status: DC | PRN

## 2022-11-03 MED ORDER — IBUPROFEN 800 MG PO TABS
800.0000 mg | ORAL_TABLET | Freq: Once | ORAL | Status: AC
  Administered 2022-11-03: 800 mg via ORAL
  Filled 2022-11-03: qty 1

## 2022-11-03 NOTE — Discharge Instructions (Signed)
Begin taking ibuprofen 600 mg every 6 hours as needed for pain.  Begin taking tramadol as prescribed as needed for pain not relieved with ibuprofen.  Follow-up with primary doctor if not improving in the next week, and return to the ER if symptoms significantly worsen or change.

## 2022-11-03 NOTE — ED Provider Notes (Signed)
[facility] Provider Note   CSN: [id number] Arrival date & time: 11/02/22  2315     History  Chief Complaint  Patient presents with   Rib Injury    Melanie Roberts is a 52 y.o. female.  Patient is a 52 year old female with past medical history of hyperlipidemia, type 2 diabetes, GERD, hypothyroidism.  Patient presenting today for evaluation of a chest wall injury.  She reports moving pallets at work when one of them stopped short and struck her in the left lower anterior ribs.  She describes pain in this area that is worse when she breathes or yawns.  She denies to me that she is short of breath, but does have pain with inspiration.  She denies other injury.  She denies abdominal pain.  The history is provided by the patient.       Home Medications Prior to Admission medications   Medication Sig Start Date End Date Taking? Authorizing Provider  albuterol (VENTOLIN HFA) 108 (90 Base) MCG/ACT inhaler Inhale 1-2 puffs into the lungs every 6 (six) hours as needed for wheezing or shortness of breath. 09/05/22   Teodora Medici, FNP  amLODipine (NORVASC) 10 MG tablet TAKE 1 TABLET(10 MG) BY MOUTH DAILY 10/20/22   Haydee Salter, MD  atorvastatin (LIPITOR) 20 MG tablet Take 1 tablet (20 mg total) by mouth daily. 09/27/21   Haydee Salter, MD  cetirizine (ZYRTEC) 10 MG tablet Take 1 tablet (10 mg total) by mouth daily. 06/29/21   Kennyth Arnold, FNP  EPINEPHrine (EPIPEN 2-PAK) 0.3 mg/0.3 mL IJ SOAJ injection Inject 0.3 mLs (0.3 mg total) into the muscle as needed (allergic reaction). 05/15/16   Whiteheart, Cristal Ford, NP  ergocalciferol (VITAMIN D2) 1.25 MG (50000 UT) capsule Take 1 capsule by mouth every 7 (seven) days. 08/27/20   [provider]  estradiol (ESTRACE) 0.5 MG tablet Take 1 tablet (0.5 mg total) by mouth daily. 12/28/21   Haydee Salter, MD  fluticasone [facility]) 50 MCG/ACT nasal spray SHAKE LIQUID AND USE 2 SPRAYS IN [facility] NOSTRIL DAILY  04/22/20   Cirigliano, Mary K, DO  furosemide (LASIX) 20 MG tablet TAKE 1 TABLET BY MOUTH DAILY AS NEEDED 10/19/20   Ronnald Nian, DO  ibuprofen (ADVIL) 600 MG tablet  11/25/20   [provider]  Lancets (ONETOUCH DELICA PLUS [id number]) [place] daily. as directed 08/03/21   [provider]  metFORMIN (GLUCOPHAGE-XR) 500 MG 24 hr tablet Take by mouth. 08/16/21   [provider]  naproxen (NAPROSYN) 375 MG tablet Take 1 tablet (375 mg total) by mouth 2 (two) times daily. 07/29/19   Palumbo, April, MD  ondansetron (ZOFRAN) 4 MG tablet Take 1 tablet (4 mg total) by mouth every 8 (eight) hours as needed for nausea or vomiting. 11/06/19   Aundra Dubin, PA-C  ONETOUCH VERIO test strip daily. 08/03/21   [provider]  pantoprazole (PROTONIX) 40 MG tablet Take 1 tablet (40 mg total) by mouth 2 (two) times daily. 05/19/22   Cirigliano, Vito V, DO  sucralfate (CARAFATE) 1 g tablet Take 1 tablet (1 g total) by mouth 4 (four) times daily -  with meals and at bedtime. 03/17/22   Haydee Salter, MD  TIROSINT 200 MCG CAPS Take by mouth. 08/16/21   [provider]  TIROSINT 25 MCG CAPS Take 1 capsule by mouth daily. 08/17/21   [provider]      Allergies    Ciprofloxacin, Quinolones, and  Bee venom    Review of Systems   Review of Systems  All other systems reviewed and are negative.   Physical Exam Updated Vital Signs BP (!) 127/94   Pulse 83   Temp 98.2 F (36.8 C) (Oral)   Resp 17   Ht '5\' 7"'$  (1.702 m)   Wt 109.3 kg   SpO2 98%   BMI 37.75 kg/m  Physical Exam Vitals and nursing note reviewed.  Constitutional:      General: She is not in acute distress.    Appearance: She is well-developed. She is not diaphoretic.  HENT:     Head: Normocephalic and atraumatic.  Cardiovascular:     Rate and Rhythm: Normal rate and regular rhythm.     Heart sounds: No murmur heard.    No friction rub. No gallop.  Pulmonary:     Effort: Pulmonary  effort is normal. No respiratory distress.     Breath sounds: Normal breath sounds. No wheezing.     Comments: There is tenderness to palpation overlying the left lower anterior ribs, but no palpable abnormality or crepitus. Abdominal:     General: Bowel sounds are normal. There is no distension.     Palpations: Abdomen is soft.     Tenderness: There is no abdominal tenderness.  Musculoskeletal:        General: Normal range of motion.     Cervical back: Normal range of motion and neck supple.  Skin:    General: Skin is warm and dry.  Neurological:     General: No focal deficit present.     Mental Status: She is alert and oriented to person, place, and time.     ED Results / Procedures / Treatments   Labs (all labs ordered are listed, but only abnormal results are displayed) Labs Reviewed - No data to display  EKG None  Radiology DG Ribs Unilateral W/Chest Left  Result Date: 11/02/2022 CLINICAL DATA:  Injury EXAM: LEFT RIBS AND CHEST - 3+ VIEW COMPARISON:  Chest x-ray 07/21/2022 FINDINGS: No fracture or other bone lesions are seen involving the ribs. There is no evidence of pneumothorax or pleural effusion. Both lungs are clear. Heart size and mediastinal contours are within normal limits. IMPRESSION: Negative. Electronically Signed   By: Ronney Asters M.D.   On: 11/02/2022 23:52    Procedures Procedures    Medications Ordered in ED Medications - No data to display  ED Course/ Medical Decision Making/ A&P  Patient is a 51 year old female presenting with chest wall contusion as described in the HPI.  She is tender over the anterior lower ribs, but there are no palpable abnormalities.  Oxygen saturations are 99% and lungs are clear bilaterally.  Chest x-ray with rib films shows no evidence for fracture, pneumothorax, or pulmonary contusion.  At this point, patient to be discharged with anti-inflammatory medication, tramadol, and follow-up as needed.  Final Clinical  Impression(s) / ED Diagnoses Final diagnoses:  None    Rx / DC Orders ED Discharge Orders     None         Veryl Speak, MD 11/03/22 [phone]

## 2022-11-09 ENCOUNTER — Encounter: Payer: Self-pay | Admitting: Neurology

## 2022-11-09 ENCOUNTER — Ambulatory Visit (INDEPENDENT_AMBULATORY_CARE_PROVIDER_SITE_OTHER): Payer: Federal, State, Local not specified - PPO | Admitting: Neurology

## 2022-11-09 VITALS — BP 115/78 | HR 69 | Ht 67.0 in | Wt 243.0 lb

## 2022-11-09 DIAGNOSIS — R519 Headache, unspecified: Secondary | ICD-10-CM

## 2022-11-09 DIAGNOSIS — G4719 Other hypersomnia: Secondary | ICD-10-CM

## 2022-11-09 DIAGNOSIS — R0681 Apnea, not elsewhere classified: Secondary | ICD-10-CM

## 2022-11-09 DIAGNOSIS — G4730 Sleep apnea, unspecified: Secondary | ICD-10-CM | POA: Diagnosis not present

## 2022-11-09 DIAGNOSIS — R351 Nocturia: Secondary | ICD-10-CM

## 2022-11-09 DIAGNOSIS — E669 Obesity, unspecified: Secondary | ICD-10-CM

## 2022-11-09 DIAGNOSIS — R0683 Snoring: Secondary | ICD-10-CM

## 2022-11-09 NOTE — Patient Instructions (Signed)

## 2022-11-09 NOTE — Progress Notes (Signed)
Subjective:    Patient ID: Melanie Roberts is a 52 y.o. female.  HPI    Star Age, MD, PhD [facility] [address]O. [place], [place] [address]  Dear Dr. Gena Roberts,  I saw your patient, Melanie Roberts, upon your kind request in my sleep clinic today for initial consultation of her sleep disorder, in particular, concern for underlying obstructive sleep apnea.  The patient is unaccompanied today.  As you know, [name] is a 52 year old female with an underlying medical history of allergies, headaches, hypertension, hypothyroidism, diabetes, reflux disease and obesity, who reports snoring and excessive daytime somnolence, as well as witnessed apneas, per sons' report.  Her Epworth sleepiness score is 16/24, fatigue severity score is 57/63.  I reviewed your office note from 09/27/2022.  She endorses significant stressors including stress regarding her son, stress of taking care of her mom who has breast cancer and dementia and lives with patient.  The patient lives with her 2 younger sons as well, ages 24 and 43, she has an older son, age 56.  She works nights, she works for the [employer].  She works from 7 PM to 3:30 AM with overtime till 5:30 AM at times.  Bedtime is generally very variable depending on how well her mom is sleeping.  She may be in bed by 4:45 AM but it could be as late as 1 PM.  Her rise time is also very variable.  If left to her own devices and if able to, she sleeps at night and she can sleep all day on days that she does not have to work.  She had a tonsillectomy and adenoidectomy as a child and then a repeat adenoidectomy at age 71 years ago.  She drinks caffeine in the form of soda, about 20 ounces per day, occasional coffee, occasional tea.  She drinks alcohol very occasionally, she is a non-smoker.  She has woken up with a sense of gasping.  She has nocturia about twice or 3 times per average night.  She has  recurrent nocturnal and morning headaches.  They have 2 dogs in the household.  Her Past Medical History Is Significant For: Past Medical History:  Diagnosis Date   Allergy    Diabetes mellitus without complication ([place])    GERD (gastroesophageal reflux disease)    Headache(784.0)    migraines   Hemorrhoids 12/2012   Hypertension    Hypothyroidism    Seasonal allergies    UTI (urinary tract infection)    Wears partial dentures    upper and lower    Her Past Surgical History Is Significant For: Past Surgical History:  Procedure Laterality Date   ADENOIDECTOMY  02/17/2005   CESAREAN SECTION  07/08/2003   ESOPHAGOGASTRODUODENOSCOPY (EGD) WITH PROPOFOL N/A 06/19/2019   Procedure: ESOPHAGOGASTRODUODENOSCOPY (EGD) WITH PROPOFOL;  Surgeon: Milus Banister, MD;  Location: [facility];  Service: Gastroenterology;  Laterality: N/A;   EUS N/A 06/19/2019   Procedure: UPPER ENDOSCOPIC ULTRASOUND (EUS) RADIAL;  Surgeon: Milus Banister, MD;  Location: [facility];  Service: Gastroenterology;  Laterality: N/A;   EVALUATION UNDER ANESTHESIA WITH HEMORRHOIDECTOMY N/A 01/14/2013   Procedure: EXAM UNDER ANESTHESIA WITH HEMORRHOIDECTOMY POSSIBLE BANDING;  Surgeon: Madilyn Hook, DO;  Location: [place];  Service: General;  Laterality: N/A;   fibroids removed     LAPAROSCOPIC TOTAL HYSTERECTOMY  10/29/2006   MYOMECTOMY  12/18/2001   x 4   NASAL TURBINATE REDUCTION Bilateral 02/07/2005  inferior and removed adenoids   ROTATOR CUFF REPAIR Right 2020   TONSILLECTOMY AND ADENOIDECTOMY  age 16   TUBAL LIGATION  07/08/2003    Her Family History Is Significant For: Family History  Problem Relation Age of Onset   Diabetes Mother    Fibroids Mother        uterine    Dementia Mother    Pancreatic cancer Father    Cancer Maternal Aunt        Breast   Breast cancer Maternal Aunt        late 49s   Cancer Maternal Aunt        Liver   Cancer Maternal Aunt        Throat    Heart disease Maternal Uncle        45s   Colon cancer Paternal Uncle    Clotting disorder Maternal Grandmother    Diabetes Maternal Grandmother    Liver cancer Maternal Grandfather    Diabetes Maternal Grandfather    Pancreatic cancer Paternal Grandmother    Diabetes Paternal Grandmother    Stomach cancer Paternal Grandfather    Diabetes Paternal Grandfather    Breast cancer Cousin        early 22s   Esophageal cancer Neg Hx    Sleep apnea Neg Hx     Her Social History Is Significant For: Social History   Socioeconomic History   Marital status: Divorced    Spouse name: Not on file   Number of children: 3   Years of education: Not on file   Highest education level: Not on file  Occupational History   Occupation: Control and instrumentation engineer    Comment: [employer]  Tobacco Use   Smoking status: Never   Smokeless tobacco: Never  Vaping Use   Vaping Use: Never used  Substance and Sexual Activity   Alcohol use: Yes    Comment: socially   Drug use: No   Sexual activity: Yes  Other Topics Concern   Not on file  Social History Narrative   Not on file   Social Determinants of Health   Financial Resource Strain: Not on file  Food Insecurity: Not on file  Transportation Needs: Not on file  Physical Activity: Not on file  Stress: Not on file  Social Connections: Not on file    Her Allergies Are:  Allergies  Allergen Reactions   Ciprofloxacin Anaphylaxis   Quinolones Anaphylaxis   Bee Venom Swelling and Rash  :   Her Current Medications Are:  Outpatient Encounter Medications as of 11/09/2022  Medication Sig   albuterol (VENTOLIN HFA) 108 (90 Base) MCG/ACT inhaler Inhale 1-2 puffs into the lungs every 6 (six) hours as needed for wheezing or shortness of breath.   amLODipine (NORVASC) 10 MG tablet TAKE 1 TABLET(10 MG) BY MOUTH DAILY   atorvastatin (LIPITOR) 20 MG tablet Take 1 tablet (20 mg total) by mouth daily.   cetirizine (ZYRTEC) 10 MG tablet Take 1 tablet (10 mg total) by mouth  daily.   EPINEPHrine (EPIPEN 2-PAK) 0.3 mg/0.3 mL IJ SOAJ injection Inject 0.3 mLs (0.3 mg total) into the muscle as needed (allergic reaction).   ergocalciferol (VITAMIN D2) 1.25 MG (50000 UT) capsule Take 1 capsule by mouth every 7 (seven) days.   estradiol (ESTRACE) 0.5 MG tablet Take 1 tablet (0.5 mg total) by mouth daily.   fluticasone (FLONASE) 50 MCG/ACT nasal spray SHAKE LIQUID AND USE 2 SPRAYS IN EACH NOSTRIL DAILY   furosemide (LASIX) 20 MG  tablet TAKE 1 TABLET BY MOUTH DAILY AS NEEDED   Lancets (ONETOUCH DELICA PLUS [id number]) MISC daily. as directed   metFORMIN (GLUCOPHAGE-XR) 500 MG 24 hr tablet Take by mouth.   naproxen (NAPROSYN) 375 MG tablet Take 1 tablet (375 mg total) by mouth 2 (two) times daily.   ONETOUCH VERIO test strip daily.   pantoprazole (PROTONIX) 40 MG tablet Take 1 tablet (40 mg total) by mouth 2 (two) times daily.   sucralfate (CARAFATE) 1 g tablet Take 1 tablet (1 g total) by mouth 4 (four) times daily -  with meals and at bedtime.   TIROSINT 200 MCG CAPS Take by mouth.   TIROSINT 25 MCG CAPS Take 1 capsule by mouth daily.   ibuprofen (ADVIL) 600 MG tablet    ondansetron (ZOFRAN) 4 MG tablet Take 1 tablet (4 mg total) by mouth every 8 (eight) hours as needed for nausea or vomiting.   traMADol (ULTRAM) 50 MG tablet Take 1 tablet (50 mg total) by mouth every 6 (six) hours as needed.   No facility-administered encounter medications on file as of 11/09/2022.  :   Review of Systems:  Out of a complete 14 point review of systems, all are reviewed and negative with the exception of these symptoms as listed below:  Review of Systems  Neurological:        Pt here for sleep consult Pt snores,fatigue,hypertension,headaches,stop breathing during the night . Pt denies sleep study, CPAP machine     ESS FSS:     Objective:  Neurological Exam  Physical Exam Physical Examination:   Vitals:   11/09/22 0917  BP: 115/78  Pulse: 69    General Examination:  The patient is a very pleasant 52 y.o. female in no acute distress. She appears well-developed and well-nourished and well groomed.   HEENT: Normocephalic, atraumatic, pupils are equal, round and reactive to light, extraocular tracking is good without limitation to gaze excursion or nystagmus noted. Hearing is grossly intact. Face is symmetric with normal facial animation. Speech is clear with no dysarthria noted. There is no hypophonia. There is no lip, neck/head, jaw or voice tremor. Neck is supple with full range of passive and active motion. There are no carotid bruits on auscultation. Oropharynx exam reveals: mild mouth dryness, adequate dental hygiene with full dentures on top, partial dentures on the bottom.  She has moderate airway crowding secondary to Mallampati class IV and large uvula, tonsils absent.  Tongue protrudes centrally and palate elevates symmetrically.  Tip of uvula visualized with difficulty.  Neck circumference 17 three-quarter inches.  Chest: Clear to auscultation without wheezing, rhonchi or crackles noted.  Heart: S1+S2+0, regular and normal without murmurs, rubs or gallops noted.   Abdomen: Soft, non-tender and non-distended.  Extremities: There is nonpitting puffiness in both lower extremities bilaterally.   Skin: Warm and dry without trophic changes noted.   Musculoskeletal: exam reveals no obvious joint deformities.   Neurologically:  Mental status: The patient is awake, alert and oriented in all 4 spheres. Her immediate and remote memory, attention, language skills and fund of knowledge are appropriate. There is no evidence of aphasia, agnosia, apraxia or anomia. Speech is clear with normal prosody and enunciation. Thought process is linear. Mood is normal and affect is normal.  Cranial nerves II - XII are as described above under HEENT exam.  Motor exam: Normal bulk, strength and tone is noted. There is no obvious action or resting tremor.  Fine motor skills and  coordination: grossly intact.  Cerebellar testing: No dysmetria or intention tremor. There is no truncal or gait ataxia.  Sensory exam: intact to light touch in the upper and lower extremities.  Gait, station and balance: She stands easily. No veering to one side is noted. No leaning to one side is noted. Posture is age-appropriate and stance is narrow based. Gait shows normal stride length and normal pace. No problems turning are noted.   Assessment and Plan:  In summary, Anjolaoluwa Siguenza is a very pleasant 52 y.o.-year old female with an underlying medical history of allergies, headaches, hypertension, hypothyroidism, diabetes, reflux disease and obesity, whose history and physical exam are concerning for sleep disordered breathing, supporting a current working diagnosis of unspecified sleep apnea, with the main differential diagnoses of obstructive sleep apnea (OSA) versus upper airway resistance syndrome (UARS) versus central sleep apnea (CSA), or mixed sleep apnea. A laboratory attended sleep study is typically considered "gold standard" for evaluation of sleep disordered breathing.   I had a long chat with the patient about my findings and the diagnosis of sleep apnea, particularly OSA, its prognosis and treatment options. We talked about medical/conservative treatments, surgical interventions and non-pharmacological approaches for symptom control. I explained, in particular, the risks and ramifications of untreated moderate to severe OSA, especially with respect to developing cardiovascular disease down the road, including congestive heart failure (CHF), difficult to treat hypertension, cardiac arrhythmias (particularly A-fib), neurovascular complications including TIA, stroke and dementia. Even type 2 diabetes has, in part, been linked to untreated OSA. Symptoms of untreated OSA may include (but may not be limited to) daytime sleepiness, nocturia (i.e. frequent nighttime urination), memory  problems, mood irritability and suboptimally controlled or worsening mood disorder such as depression and/or anxiety, lack of energy, lack of motivation, physical discomfort, as well as recurrent headaches, especially morning or nocturnal headaches. We talked about the importance of maintaining a healthy lifestyle and striving for healthy weight.  In addition, we talked about the importance of striving for and maintaining good sleep hygiene. I recommended a sleep study at this time.  She would prefer to sleep at night if she came in for a laboratory attended sleep study.  I outlined the differences between a laboratory attended sleep study which is considered more comprehensive and accurate over the option of a home sleep test (HST); the latter may lead to underestimation of sleep disordered breathing in some instances and does not help with diagnosing upper airway resistance syndrome and is not accurate enough to diagnose primary central sleep apnea typically. I outlined possible surgical and non-surgical treatment options of OSA, including the use of a positive airway pressure (PAP) device (i.e. CPAP, AutoPAP/APAP or BiPAP in certain circumstances), a custom-made dental device (aka oral appliance, which would require a referral to a specialist dentist or orthodontist typically, and is generally speaking not considered for patients with full dentures or edentulous state), upper airway surgical options, such as traditional UPPP (which is not considered a first-line treatment) or the Inspire device (hypoglossal nerve stimulator, which would involve a referral for consultation with an ENT surgeon, after careful selection, following inclusion criteria - also not first-line treatment). I explained the PAP treatment option to the patient in detail, as this is generally considered first-line treatment.  The patient indicated that she would be willing to try PAP therapy, if the need arises. I explained the importance of  being compliant with PAP treatment, not only for insurance purposes but primarily to improve patient's symptoms symptoms, and for the patient's  long term health benefit, including to reduce Her cardiovascular risks longer-term.    We will pick up our discussion about the next steps and treatment options after testing.  We will keep her posted as to the test results by phone call and/or MyChart messaging where possible.  We will plan to follow-up in sleep clinic accordingly as well.  I answered all her questions today and the patient was in agreement.   I encouraged her to call with any interim questions, concerns, problems or updates or email [place] through [place].  Generally speaking, sleep test authorizations may take up to 2 weeks, sometimes less, sometimes longer, the patient is encouraged to get in touch with [place] if they do not hear back from the sleep lab staff directly within the next 2 weeks.  Thank you very much for allowing me to participate in the care of this nice patient. If I can be of any further assistance to you please do not hesitate to call me at [phone].  Sincerely,   Star Age, MD, PhD

## 2022-11-16 ENCOUNTER — Other Ambulatory Visit: Payer: Self-pay | Admitting: Family Medicine

## 2022-11-16 DIAGNOSIS — E7800 Pure hypercholesterolemia, unspecified: Secondary | ICD-10-CM

## 2022-11-29 DIAGNOSIS — Z79890 Hormone replacement therapy (postmenopausal): Secondary | ICD-10-CM | POA: Diagnosis not present

## 2022-11-29 DIAGNOSIS — Z7984 Long term (current) use of oral hypoglycemic drugs: Secondary | ICD-10-CM | POA: Diagnosis not present

## 2022-11-29 DIAGNOSIS — E039 Hypothyroidism, unspecified: Secondary | ICD-10-CM | POA: Diagnosis not present

## 2022-11-29 DIAGNOSIS — E119 Type 2 diabetes mellitus without complications: Secondary | ICD-10-CM | POA: Diagnosis not present

## 2022-11-29 DIAGNOSIS — E1165 Type 2 diabetes mellitus with hyperglycemia: Secondary | ICD-10-CM | POA: Diagnosis not present

## 2022-12-02 ENCOUNTER — Ambulatory Visit (INDEPENDENT_AMBULATORY_CARE_PROVIDER_SITE_OTHER): Payer: Federal, State, Local not specified - PPO

## 2022-12-02 ENCOUNTER — Ambulatory Visit
Admission: EM | Admit: 2022-12-02 | Discharge: 2022-12-02 | Disposition: A | Discharge status: Home / Self Care | Payer: Federal, State, Local not specified - PPO | Attending: Internal Medicine | Admitting: Internal Medicine

## 2022-12-02 DIAGNOSIS — R059 Cough, unspecified: Secondary | ICD-10-CM

## 2022-12-02 DIAGNOSIS — U071 COVID-19: Secondary | ICD-10-CM | POA: Diagnosis not present

## 2022-12-02 MED ORDER — BENZONATATE 100 MG PO CAPS: 100.0000 mg | ORAL_CAPSULE | Freq: Three times a day (TID) | ORAL | 0 refills | Status: DC | PRN

## 2022-12-02 MED ORDER — ALBUTEROL SULFATE HFA 108 (90 BASE) MCG/ACT IN AERS: 1.0000 | INHALATION_SPRAY | Freq: Four times a day (QID) | RESPIRATORY_TRACT | 0 refills | Status: AC | PRN

## 2022-12-02 MED ORDER — PREDNISONE 20 MG PO TABS: 40.0000 mg | ORAL_TABLET | Freq: Every day | ORAL | 0 refills | Status: AC

## 2022-12-02 NOTE — ED Triage Notes (Signed)
[facility] presents to [facility] with [facility] of body aches and pains, congestion, chest tightness. Pt reports at home covid pos. No otc medications

## 2022-12-02 NOTE — ED Provider Notes (Signed)
[facility]    CSN: [id number] Arrival date & time: 12/02/22  1138      History   Chief Complaint Chief Complaint  Patient presents with   Covid Positive    HPI Melanie Roberts is a 52 y.o. female.   Patient presents for further evaluation after testing positive for COVID-19 with an at home test today.  Reports 2 COVID positive tests today.  Symptoms started about 6 days ago and include cough, chest tightness, generalized bodyaches, sore throat.  Patient denies nasal congestion, runny nose, nausea, vomiting, diarrhea.  Has taken DayQuil and NyQuil with minimal improvement of symptoms.  Denies history of asthma or COPD and patient does not smoke cigarettes. Denies shortness of breath.      Past Medical History:  Diagnosis Date   Allergy    Diabetes mellitus without complication ([place])    GERD (gastroesophageal reflux disease)    Headache(784.0)    migraines   Hemorrhoids 12/2012   Hypertension    Hypothyroidism    Seasonal allergies    UTI (urinary tract infection)    Wears partial dentures    upper and lower    Patient Active Problem List   Diagnosis Date Noted   Macromastia 03/17/2022   Thyroid nodule 09/27/2021   Gastroesophageal reflux disease 09/27/2021   Low back pain 09/27/2021   Pedal edema 09/27/2021   Plantar fasciitis 09/27/2021   Essential hypertension 09/27/2021   Type 2 diabetes mellitus ([place]) 04/06/2021   Vitamin D deficiency 10/05/2020   Pain in right elbow 11/13/2019   Impingement syndrome of right shoulder 10/29/2019   Nontraumatic incomplete tear of right rotator cuff 10/29/2019   Gastric nodule    Hypothyroidism due to Hashimoto's thyroiditis 01/26/2017   Hyperlipidemia 01/26/2017   Bee sting allergy 05/14/2016   Idiopathic urticaria 09/29/2015    Past Surgical History:  Procedure Laterality Date   ADENOIDECTOMY  02/17/2005   CESAREAN SECTION  07/08/2003   ESOPHAGOGASTRODUODENOSCOPY (EGD) WITH PROPOFOL N/A  06/19/2019   Procedure: ESOPHAGOGASTRODUODENOSCOPY (EGD) WITH PROPOFOL;  Surgeon: Milus Banister, MD;  Location: [facility];  Service: Gastroenterology;  Laterality: N/A;   EUS N/A 06/19/2019   Procedure: UPPER ENDOSCOPIC ULTRASOUND (EUS) RADIAL;  Surgeon: Milus Banister, MD;  Location: [facility];  Service: Gastroenterology;  Laterality: N/A;   EVALUATION UNDER ANESTHESIA WITH HEMORRHOIDECTOMY N/A 01/14/2013   Procedure: EXAM UNDER ANESTHESIA WITH HEMORRHOIDECTOMY POSSIBLE BANDING;  Surgeon: Madilyn Hook, DO;  Location: [facility];  Service: General;  Laterality: N/A;   fibroids removed     LAPAROSCOPIC TOTAL HYSTERECTOMY  10/29/2006   MYOMECTOMY  12/18/2001   x 4   NASAL TURBINATE REDUCTION Bilateral 02/07/2005   inferior and removed adenoids   ROTATOR CUFF REPAIR Right 2020   TONSILLECTOMY AND ADENOIDECTOMY  age 89   TUBAL LIGATION  07/08/2003    OB History   No obstetric history on file.      Home Medications    Prior to Admission medications   Medication Sig Start Date End Date Taking? Authorizing Provider  albuterol (VENTOLIN HFA) 108 (90 Base) MCG/ACT inhaler Inhale 1-2 puffs into the lungs every 6 (six) hours as needed for wheezing or shortness of breath. 12/02/22  Yes Dianna Deshler, Hildred Alamin E, FNP  benzonatate (TESSALON) 100 MG capsule Take 1 capsule (100 mg total) by mouth every 8 (eight) hours as needed for cough. 12/02/22  Yes Robby Pirani, Hildred Alamin E, FNP  predniSONE (DELTASONE) 20 MG tablet Take 2 tablets (40 mg total) by  mouth daily for 5 days. 12/02/22 12/07/22 Yes Brittanee Ghazarian, Hildred Alamin E, FNP  amLODipine (NORVASC) 10 MG tablet TAKE 1 TABLET(10 MG) BY MOUTH DAILY 10/20/22   Haydee Salter, MD  atorvastatin (LIPITOR) 20 MG tablet TAKE 1 TABLET(20 MG) BY MOUTH DAILY 11/16/22   Haydee Salter, MD  cetirizine (ZYRTEC) 10 MG tablet Take 1 tablet (10 mg total) by mouth daily. 06/29/21   Kennyth Arnold, FNP  EPINEPHrine (EPIPEN 2-PAK) 0.3 mg/0.3 mL IJ SOAJ injection Inject 0.3 mLs (0.3  mg total) into the muscle as needed (allergic reaction). 05/15/16   Whiteheart, Cristal Ford, NP  ergocalciferol (VITAMIN D2) 1.25 MG (50000 UT) capsule Take 1 capsule by mouth every 7 (seven) days. 08/27/20   [provider]  estradiol (ESTRACE) 0.5 MG tablet Take 1 tablet (0.5 mg total) by mouth daily. 12/28/21   Haydee Salter, MD  fluticasone [facility]) 50 MCG/ACT nasal spray SHAKE LIQUID AND USE 2 SPRAYS IN [facility] NOSTRIL DAILY 04/22/20   Cirigliano, Mary K, DO  furosemide (LASIX) 20 MG tablet TAKE 1 TABLET BY MOUTH DAILY AS NEEDED 10/19/20   Ronnald Nian, DO  ibuprofen (ADVIL) 600 MG tablet  11/25/20   [provider]  Lancets (ONETOUCH DELICA PLUS Q000111Q) [place] daily. as directed 08/03/21   [provider]  metFORMIN (GLUCOPHAGE-XR) 500 MG 24 hr tablet Take by mouth. 08/16/21   [provider]  naproxen (NAPROSYN) 375 MG tablet Take 1 tablet (375 mg total) by mouth 2 (two) times daily. 07/29/19   Palumbo, April, MD  ondansetron (ZOFRAN) 4 MG tablet Take 1 tablet (4 mg total) by mouth every 8 (eight) hours as needed for nausea or vomiting. 11/06/19   Aundra Dubin, PA-C  ONETOUCH VERIO test strip daily. 08/03/21   [provider]  pantoprazole (PROTONIX) 40 MG tablet Take 1 tablet (40 mg total) by mouth 2 (two) times daily. 05/19/22   Cirigliano, Vito V, DO  sucralfate (CARAFATE) 1 g tablet Take 1 tablet (1 g total) by mouth 4 (four) times daily -  with meals and at bedtime. 03/17/22   Haydee Salter, MD  TIROSINT 200 MCG CAPS Take by mouth. 08/16/21   [provider]  TIROSINT 25 MCG CAPS Take 1 capsule by mouth daily. 08/17/21   [provider]  traMADol (ULTRAM) 50 MG tablet Take 1 tablet (50 mg total) by mouth every 6 (six) hours as needed. 11/03/22   Veryl Speak, MD    Family History Family History  Problem Relation Age of Onset   Diabetes Mother    Fibroids Mother        uterine    Dementia Mother    Pancreatic cancer  Father    Cancer Maternal Aunt        Breast   Breast cancer Maternal Aunt        late 73s   Cancer Maternal Aunt        Liver   Cancer Maternal Aunt        Throat   Heart disease Maternal Uncle        38s   Colon cancer Paternal Uncle    Clotting disorder Maternal Grandmother    Diabetes Maternal Grandmother    Liver cancer Maternal Grandfather    Diabetes Maternal Grandfather    Pancreatic cancer Paternal Grandmother    Diabetes Paternal Grandmother    Stomach cancer Paternal Grandfather    Diabetes Paternal Grandfather    Breast cancer Cousin  early 93s   Esophageal cancer Neg Hx    Sleep apnea Neg Hx     Social History Social History   Tobacco Use   Smoking status: Never   Smokeless tobacco: Never  Vaping Use   Vaping Use: Never used  Substance Use Topics   Alcohol use: Yes    Comment: socially   Drug use: No     Allergies   Ciprofloxacin, Quinolones, and Bee venom   Review of Systems Review of Systems Per HPI  Physical Exam Triage Vital Signs ED Triage Vitals  Enc Vitals Group     BP 12/02/22 1310 124/85     Pulse --      Resp 12/02/22 1310 19     Temp 12/02/22 1310 98.8 F (37.1 C)     Temp src --      SpO2 12/02/22 1310 93 %     Weight --      Height --      Head Circumference --      Peak Flow --      Pain Score 12/02/22 1309 6     Pain Loc --      Pain Edu? --      Excl. in [place]? --    No data found.  Updated Vital Signs BP 124/85   Pulse 98   Temp 98.8 F (37.1 C)   Resp 19   SpO2 96%   Visual Acuity Right Eye Distance:   Left Eye Distance:   Bilateral Distance:    Right Eye Near:   Left Eye Near:    Bilateral Near:     Physical Exam Constitutional:      General: She is not in acute distress.    Appearance: Normal appearance. She is not toxic-appearing or diaphoretic.  HENT:     Head: Normocephalic and atraumatic.     Right Ear: Tympanic membrane and ear canal normal.     Left Ear: Tympanic membrane and ear  canal normal.     Nose: Congestion present.     Mouth/Throat:     Mouth: Mucous membranes are moist.     Pharynx: Posterior oropharyngeal erythema present.  Eyes:     Extraocular Movements: Extraocular movements intact.     Conjunctiva/sclera: Conjunctivae normal.     Pupils: Pupils are equal, round, and reactive to light.  Cardiovascular:     Rate and Rhythm: Normal rate and regular rhythm.     Pulses: Normal pulses.     Heart sounds: Normal heart sounds.  Pulmonary:     Effort: Pulmonary effort is normal. No respiratory distress.     Breath sounds: Normal breath sounds. No stridor. No wheezing, rhonchi or rales.  Abdominal:     General: Abdomen is flat. Bowel sounds are normal.     Palpations: Abdomen is soft.  Musculoskeletal:        General: Normal range of motion.     Cervical back: Normal range of motion.  Skin:    General: Skin is warm and dry.  Neurological:     General: No focal deficit present.     Mental Status: She is alert and oriented to person, place, and time. Mental status is at baseline.  Psychiatric:        Mood and Affect: Mood normal.        Behavior: Behavior normal.      UC Treatments / Results  Labs (all labs ordered are listed, but only abnormal results are displayed) Labs  Reviewed - No data to display  EKG   Radiology DG Chest 2 View  Result Date: 12/02/2022 CLINICAL DATA:  Positive for COVID-19.  Cough. EXAM: CHEST - 2 VIEW COMPARISON:  11/02/2022. FINDINGS: Normal heart, mediastinum and hila. Clear lungs.  No pleural effusion or pneumothorax. Skeletal structures are intact. IMPRESSION: No active cardiopulmonary disease. Electronically Signed   By: Lajean Manes M.D.   On: 12/02/2022 13:31    Procedures Procedures (including critical care time)  Medications Ordered in UC Medications - No data to display  Initial Impression / Assessment and Plan / UC Course  I have reviewed the triage vital signs and the nursing notes.  Pertinent labs  & imaging results that were available during my care of the patient were reviewed by me and considered in my medical decision making (see chart for details).     Patient tested positive for COVID-19 with an at home test.  Patient is currently outside the treatment window for COVID antivirals.  Discussed this with patient.  Will treat with supportive care.  Patient reporting chest tightness but there are no adventitious lung sounds on exam.  Chest imaging was negative for any acute cardiopulmonary process.  Oxygen is normal with no tachypnea so do not think that emergent evaluation is necessary.  Suspect inflammation causing chest tightness.  No concern for cardiac etiology.  Will treat with albuterol and prednisone.  Patient has taken prednisone before and tolerated well.  She states that her blood sugar does not typically increase with prednisone.  No other obvious contraindications to steroid therapy noted in patient's history.  Cough medication also prescribed for patient.  Discussed quarantine with patient.  Discussed return precautions.  Patient verbalized understanding and was agreeable with plan. Final Clinical Impressions(s) / UC Diagnoses   Final diagnoses:  [id number]     Discharge Instructions      I have prescribed a few different medications to alleviate symptoms.  Please follow-up if any symptoms persist or worsen.     ED Prescriptions     Medication Sig Dispense Auth. Provider   albuterol (VENTOLIN HFA) 108 (90 Base) MCG/ACT inhaler Inhale 1-2 puffs into the lungs every 6 (six) hours as needed for wheezing or shortness of breath. 1 each [place], Hildred Alamin E, [place]   predniSONE (DELTASONE) 20 MG tablet Take 2 tablets (40 mg total) by mouth daily for 5 days. 10 tablet [place], [place] E, [place]   benzonatate (TESSALON) 100 MG capsule Take 1 capsule (100 mg total) by mouth every 8 (eight) hours as needed for cough. 21 capsule [place], Michele Rockers, [place]      PDMP not reviewed this encounter.    Teodora Medici, [place] 12/02/22 1414

## 2022-12-02 NOTE — Discharge Instructions (Signed)
I have prescribed a few different medications to alleviate symptoms.  Please follow-up if any symptoms persist or worsen.

## 2022-12-12 ENCOUNTER — Telehealth: Payer: Self-pay | Admitting: Neurology

## 2022-12-12 NOTE — Telephone Encounter (Signed)
HST- No auth req'd for outpatient services as long as medically necessary ref#Melanie Roberts   Patient is scheduled at [facility] for 01/17/23 at 10 AM.  Mailed packet to the patient.

## 2023-01-17 ENCOUNTER — Ambulatory Visit (INDEPENDENT_AMBULATORY_CARE_PROVIDER_SITE_OTHER): Payer: Federal, State, Local not specified - PPO | Admitting: Neurology

## 2023-01-17 DIAGNOSIS — R0683 Snoring: Secondary | ICD-10-CM

## 2023-01-17 DIAGNOSIS — G4730 Sleep apnea, unspecified: Secondary | ICD-10-CM

## 2023-01-17 DIAGNOSIS — R519 Headache, unspecified: Secondary | ICD-10-CM

## 2023-01-17 DIAGNOSIS — G4731 Primary central sleep apnea: Secondary | ICD-10-CM

## 2023-01-17 DIAGNOSIS — R0681 Apnea, not elsewhere classified: Secondary | ICD-10-CM

## 2023-01-17 DIAGNOSIS — R351 Nocturia: Secondary | ICD-10-CM

## 2023-01-17 DIAGNOSIS — G4719 Other hypersomnia: Secondary | ICD-10-CM

## 2023-01-17 DIAGNOSIS — G4733 Obstructive sleep apnea (adult) (pediatric): Secondary | ICD-10-CM

## 2023-01-17 DIAGNOSIS — G4734 Idiopathic sleep related nonobstructive alveolar hypoventilation: Secondary | ICD-10-CM

## 2023-01-17 DIAGNOSIS — E669 Obesity, unspecified: Secondary | ICD-10-CM

## 2023-01-18 NOTE — Addendum Note (Signed)
Addended by: Star Age on: 01/18/2023 05:32 PM   Modules accepted: Orders

## 2023-01-18 NOTE — Procedures (Signed)
[facility]  HOME SLEEP TEST (Watch PAT) REPORT  STUDY DATE: 01/17/23  DOB: May 08, 1971  MRN: [id number]  ORDERING CLINICIAN: Star Age, MD, PhD   REFERRING CLINICIAN: Haydee Salter, MD   CLINICAL INFORMATION/HISTORY: 52 year old female with an underlying medical history of allergies, headaches, hypertension, hypothyroidism, diabetes, reflux disease and obesity, who reports snoring and excessive daytime somnolence, as well as witnessed apneas,  Epworth sleepiness score: 16/24.  BMI: 38.1 kg/m  FINDINGS:   Sleep Summary:   Total Recording Time (hours, min): 10 hours, 15 min  Total Sleep Time (hours, min):  9 hours, 38 min  Percent REM (%):    20.3%   Respiratory Indices:   Calculated pAHI (per hour):  76.4/hour         REM pAHI:    75.6/hour       NREM pAHI: 76.6/hour  Central pAHI: 16.1/hour  Oxygen Saturation Statistics:    Oxygen Saturation (%) Mean: 91%   Minimum oxygen saturation (%):                 57%   O2 Saturation Range (%): 57 - 100%    O2 Saturation (minutes) <=88%: 109.6 min  Pulse Rate Statistics:   Pulse Mean (bpm):    69/min    Pulse Range (52 - 100/min)   IMPRESSION: OSA (obstructive sleep apnea), severe Central Sleep Apnea  Nocturnal Hypoxemia  RECOMMENDATION:  This home sleep test demonstrates rather severe obstructive sleep apnea with a total AHI of 76.4/hour and O2 nadir of 57% with significant time below or at 88% saturation of over 100 minutes, indicating nocturnal hypoxemia. There was a moderate central apnea component as well.  Snoring was detected, in the moderate to loud range, at times milder. Urgent treatment with positive airway pressure is highly recommended. The patient will be advised to proceed with an autoPAP titration/trial at home. A laboratory attended titration study can be considered in the future for optimization of treatment settings and to improve tolerance and compliance. Alternative  treatment options are limited secondary to the severity of the patient's sleep disordered breathing, but may include surgical treatment with an implantable hypoglossal nerve stimulator (in carefully selected candidates, meeting criteria).  Concomitant weight loss is highly recommended as well. Please note, that untreated obstructive sleep apnea may carry additional perioperative morbidity. Patients with significant obstructive sleep apnea should receive perioperative PAP therapy and the surgeons and particularly the anesthesiologist should be informed of the diagnosis and the severity of the sleep disordered breathing. The patient should be cautioned not to drive, work at heights, or operate dangerous or heavy equipment when tired or sleepy. Review and reiteration of good sleep hygiene measures should be pursued with any patient. Other causes of the patient's symptoms, including circadian rhythm disturbances, an underlying mood disorder, medication effect and/or an underlying medical problem cannot be ruled out based on this test. Clinical correlation is recommended.  The patient and her referring provider will be notified of the test results. The patient will be seen in follow up in sleep clinic at [facility].  I certify that I have reviewed the raw data recording prior to the issuance of this report in accordance with the standards of the American Academy of Sleep Medicine (AASM).  INTERPRETING PHYSICIAN:   Star Age, MD, PhD Medical Director, [facility]) [place], ABPN (Neurology and Sleep)   [facility] [address], [place], [place] [address] [phone]

## 2023-01-18 NOTE — Progress Notes (Signed)
See procedure note.

## 2023-01-23 ENCOUNTER — Telehealth: Payer: Self-pay | Admitting: *Deleted

## 2023-01-23 NOTE — Telephone Encounter (Signed)
I called pt. I advised pt that Dr. Rexene Alberts reviewed their sleep study results and found that pt has severe OSA. Dr. Rexene Alberts recommends that pt start autopap urgently. I reviewed PAP compliance expectations with the pt. Pt is agreeable to starting an auto-PAP. I advised pt that an order will be sent to a DME, [facility], and they will call the pt within about one week after they file with the pt's insurance. They will show the pt how to use the machine, fit for masks, and troubleshoot the auto-PAP if needed. A follow up appt was made for insurance purposes with Debbora Presto NP  on 04-09-2023. Pt verbalized understanding to arrive 15 minutes early and bring their auto-PAP. A letter with all of this information in it will be mailed to the pt as a reminder. I verified with the pt that the address we have on file is correct. Pt verbalized understanding of results. Pt had no questions at this time but was encouraged to call back if questions arise. I have sent the order to [facility] and have received confirmation that they have received the order.

## 2023-01-23 NOTE — Telephone Encounter (Signed)
-----   Message from Star Age, MD sent at 01/18/2023  5:32 PM EDT ----- Urgent set up requested on PAP therapy, due to severe OSA. Patient referred by PCP, seen by me on 11/09/22, patient had a HST on 01/17/23.    Please call and notify the patient that the recent home sleep test showed obstructive sleep apnea in the rather severe range. I recommend urgent treatment start in the form of autoPAP, which means, that we don't have to bring her in for a sleep study with CPAP, but will let her start using a so called autoPAP machine at home, through a DME company (of her choice, or as per insurance requirement). The DME representative will fit the patient with a mask of choice, educate her on how to use the machine, how to put the mask on, etc. I have placed an order in the chart. Please send the order to a local DME, talk to patient, send report to referring MD. Please also reinforce the need for compliance with treatment. We will need a FU in sleep clinic for 10 weeks post-PAP set up, please arrange that with me or one of our NPs.  While waiting for autoPAP set up at home, I recommend patient sleep mildly with HOB elevated between 30-45 degrees and primarily sleep on her sided. Working aggressively on wt loss is also highly recommended.  Thanks,   Star Age, MD, PhD [facility])

## 2023-02-06 DIAGNOSIS — R400 Somnolence: Secondary | ICD-10-CM | POA: Diagnosis not present

## 2023-02-06 DIAGNOSIS — G4733 Obstructive sleep apnea (adult) (pediatric): Secondary | ICD-10-CM | POA: Diagnosis not present

## 2023-02-17 ENCOUNTER — Other Ambulatory Visit: Payer: Self-pay | Admitting: Family Medicine

## 2023-02-17 DIAGNOSIS — N951 Menopausal and female climacteric states: Secondary | ICD-10-CM

## 2023-03-02 ENCOUNTER — Ambulatory Visit
Admission: EM | Admit: 2023-03-02 | Discharge: 2023-03-02 | Disposition: A | Discharge status: Home / Self Care | Payer: Federal, State, Local not specified - PPO

## 2023-03-02 DIAGNOSIS — J209 Acute bronchitis, unspecified: Secondary | ICD-10-CM | POA: Diagnosis not present

## 2023-03-02 DIAGNOSIS — R053 Chronic cough: Secondary | ICD-10-CM | POA: Diagnosis not present

## 2023-03-02 DIAGNOSIS — K2100 Gastro-esophageal reflux disease with esophagitis, without bleeding: Secondary | ICD-10-CM

## 2023-03-02 MED ORDER — PROMETHAZINE-DM 6.25-15 MG/5ML PO SYRP: 5.0000 mL | ORAL_SOLUTION | Freq: Every evening | ORAL | 0 refills | Status: DC | PRN

## 2023-03-02 MED ORDER — FAMOTIDINE 20 MG PO TABS: 20.0000 mg | ORAL_TABLET | Freq: Two times a day (BID) | ORAL | 0 refills | Status: AC

## 2023-03-02 MED ORDER — DEXAMETHASONE SODIUM PHOSPHATE 10 MG/ML IJ SOLN
10.0000 mg | Freq: Once | INTRAMUSCULAR | Status: AC
  Administered 2023-03-02: 10 mg via INTRAMUSCULAR

## 2023-03-02 NOTE — Discharge Instructions (Addendum)
It is unclear what is causing your persistent cough, however I suspect you may have bronchitis (viral upper respiratory infection).   I also suspect he may have worsening cough due to your acid reflux.  You may purchase Delsym over-the-counter and take this to help with your cough as needed during the daytime. At bedtime, you may use Promethazine DM.  Do not use this during the daytime as it can make you very sleepy.  Do not drive or drink alcohol while using this for the same reason. Continue guaifenesin as this will help to thin your mucus.  I gave you a shot of steroid in the clinic to help calm down the inflammation to your chest.  This will continue working in your body over the next 2 to 3 days to help with your cough.  Add Pepcid to your acid reflux medication regimen.  Take Pepcid 20 mg twice daily.   Continue taking your allergy medications.  Please schedule an appointment with your primary care provider for ongoing evaluation and management of your symptoms.  If you develop any new or worsening symptoms or do not improve in the next 2 to 3 days, please return.  If your symptoms are severe, please go to the emergency room.  Follow-up with your primary care provider for further evaluation and management of your symptoms as well as ongoing wellness visits.  I hope you feel better!

## 2023-03-02 NOTE — ED Provider Notes (Signed)
[facility]    CSN: [id number] Arrival date & time: 03/02/23  1022      History   Chief Complaint Chief Complaint  Patient presents with   Cough    HPI Melanie Roberts is a 52 y.o. female.   Patient with history of type 2 diabetes, seasonal allergies, hypertension, and GERD presents to urgent care for evaluation of persistent cough for the last 2-3 weeks that has worsened over the last 1 week. Cough is mostly dry but sometimes productive with yellow/clear sputum.  States that she sometimes gets short of breath after coughing fits especially at nighttime.  Cough is worsened by warm/moist environment.  She states she started using the CPAP 1 month ago after diagnosis of severe sleep apnea and wonders if this is contributing to cough.  She also has a history of gastroesophageal reflux disease and takes Protonix 40 mg twice daily as well as Carafate.  She wonders if cough could be related to GERD.  Seen by gastroenterology last year where they performed EGD without abnormal findings.  States she vomits approximately 30 minutes after eating due to acid reflux.  Denies frequent intake of known GERD triggers.  Former marijuana smoker, denies current drug use and history of cigarette use.  No chest pain, current shortness of breath, dizziness, abdominal pain, body aches, fever/chills, recent known sick contacts with similar symptoms, or recent antibiotic/steroid use.  No orthopnea or leg swelling reported.  She does not take an ace inhibitor.  Patient had COVID-19 in February 2024 and was prescribed an albuterol inhaler at that time to help with cough, shortness of breath, and wheeze.  She has been using inhaler as needed for symptoms and states that it has not been helping very much.   Cough   Past Medical History:  Diagnosis Date   Allergy    Diabetes mellitus without complication (HCC)    GERD (gastroesophageal reflux disease)    Headache(784.0)    migraines    Hemorrhoids 12/2012   Hypertension    Hypothyroidism    Seasonal allergies    UTI (urinary tract infection)    Wears partial dentures    upper and lower    Patient Active Problem List   Diagnosis Date Noted   Macromastia 03/17/2022   Thyroid nodule 09/27/2021   Gastroesophageal reflux disease 09/27/2021   Low back pain 09/27/2021   Pedal edema 09/27/2021   Plantar fasciitis 09/27/2021   Essential hypertension 09/27/2021   Type 2 diabetes mellitus (HCC) 04/06/2021   Vitamin D deficiency 10/05/2020   Pain in right elbow 11/13/2019   Impingement syndrome of right shoulder 10/29/2019   Nontraumatic incomplete tear of right rotator cuff 10/29/2019   Gastric nodule    Hypothyroidism due to Hashimoto's thyroiditis 01/26/2017   Hyperlipidemia 01/26/2017   Bee sting allergy 05/14/2016   Idiopathic urticaria 09/29/2015    Past Surgical History:  Procedure Laterality Date   ADENOIDECTOMY  02/17/2005   CESAREAN SECTION  07/08/2003   ESOPHAGOGASTRODUODENOSCOPY (EGD) WITH PROPOFOL N/A 06/19/2019   Procedure: ESOPHAGOGASTRODUODENOSCOPY (EGD) WITH PROPOFOL;  Surgeon: Rachael Fee, MD;  Location: [facility];  Service: Gastroenterology;  Laterality: N/A;   EUS N/A 06/19/2019   Procedure: UPPER ENDOSCOPIC ULTRASOUND (EUS) RADIAL;  Surgeon: Rachael Fee, MD;  Location: [facility];  Service: Gastroenterology;  Laterality: N/A;   EVALUATION UNDER ANESTHESIA WITH HEMORRHOIDECTOMY N/A 01/14/2013   Procedure: EXAM UNDER ANESTHESIA WITH HEMORRHOIDECTOMY POSSIBLE BANDING;  Surgeon: Lodema Pilot, DO;  Location: [facility];  Service: General;  Laterality: N/A;   fibroids removed     LAPAROSCOPIC TOTAL HYSTERECTOMY  10/29/2006   MYOMECTOMY  12/18/2001   x 4   NASAL TURBINATE REDUCTION Bilateral 02/07/2005   inferior and removed adenoids   ROTATOR CUFF REPAIR Right 2020   TONSILLECTOMY AND ADENOIDECTOMY  age 55   TUBAL LIGATION  07/08/2003    OB History   No obstetric  history on file.      Home Medications    Prior to Admission medications   Medication Sig Start Date End Date Taking? Authorizing Provider  dextromethorphan-guaiFENesin (MUCINEX DM) 30-600 MG 12hr tablet Take 1 tablet by mouth 2 (two) times daily.   Yes [provider]  famotidine (PEPCID) 20 MG tablet Take 1 tablet (20 mg total) by mouth 2 (two) times daily. 03/02/23  Yes Carlisle Beers, FNP  promethazine-dextromethorphan (PROMETHAZINE-DM) 6.25-15 MG/5ML syrup Take 5 mLs by mouth at bedtime as needed for cough. 03/02/23  Yes Carlisle Beers, FNP  albuterol (VENTOLIN HFA) 108 (90 Base) MCG/ACT inhaler Inhale 1-2 puffs into the lungs every 6 (six) hours as needed for wheezing or shortness of breath. 12/02/22   Gustavus Bryant, FNP  amLODipine (NORVASC) 10 MG tablet TAKE 1 TABLET(10 MG) BY MOUTH DAILY 10/20/22   Loyola Mast, MD  atorvastatin (LIPITOR) 20 MG tablet TAKE 1 TABLET(20 MG) BY MOUTH DAILY 11/16/22   Loyola Mast, MD  benzonatate (TESSALON) 100 MG capsule Take 1 capsule (100 mg total) by mouth every 8 (eight) hours as needed for cough. 12/02/22   Gustavus Bryant, FNP  cetirizine (ZYRTEC) 10 MG tablet Take 1 tablet (10 mg total) by mouth daily. 06/29/21   Eulis Foster, FNP  EPINEPHrine (EPIPEN 2-PAK) 0.3 mg/0.3 mL IJ SOAJ injection Inject 0.3 mLs (0.3 mg total) into the muscle as needed (allergic reaction). 05/15/16   Whiteheart, Mellody Life, NP  ergocalciferol (VITAMIN D2) 1.25 MG (50000 UT) capsule Take 1 capsule by mouth every 7 (seven) days. 08/27/20   [provider]  estradiol (ESTRACE) 0.5 MG tablet TAKE 1 TABLET(0.5 MG) BY MOUTH DAILY 02/19/23   Loyola Mast, MD  fluticasone [facility]) 50 MCG/ACT nasal spray SHAKE LIQUID AND USE 2 SPRAYS IN EACH NOSTRIL DAILY 04/22/20   Cirigliano, Mary K, DO  furosemide (LASIX) 20 MG tablet TAKE 1 TABLET BY MOUTH DAILY AS NEEDED 10/19/20   Overton Mam, DO  ibuprofen (ADVIL) 600 MG tablet  11/25/20   [provider]  Lancets (ONETOUCH DELICA PLUS LANCET30G) MISC daily. as directed 08/03/21   [provider]  metFORMIN (GLUCOPHAGE-XR) 500 MG 24 hr tablet Take by mouth. 08/16/21   [provider]  naproxen (NAPROSYN) 375 MG tablet Take 1 tablet (375 mg total) by mouth 2 (two) times daily. 07/29/19   Palumbo, April, MD  ondansetron (ZOFRAN) 4 MG tablet Take 1 tablet (4 mg total) by mouth every 8 (eight) hours as needed for nausea or vomiting. 11/06/19   Cristie Hem, PA-C  ONETOUCH VERIO test strip daily. 08/03/21   [provider]  pantoprazole (PROTONIX) 40 MG tablet Take 1 tablet (40 mg total) by mouth 2 (two) times daily. 05/19/22   Cirigliano, Vito V, DO  sucralfate (CARAFATE) 1 g tablet Take 1 tablet (1 g total) by mouth 4 (four) times daily -  with meals and at bedtime. 03/17/22   Loyola Mast, MD  TIROSINT 200 MCG CAPS Take by mouth. 08/16/21   [provider]  TIROSINT 25 MCG CAPS Take 1 capsule by mouth daily. 08/17/21   [provider]  traMADol (ULTRAM) 50 MG tablet Take 1 tablet (50 mg total) by mouth every 6 (six) hours as needed. 11/03/22   Geoffery Lyons, MD    Family History Family History  Problem Relation Age of Onset   Diabetes Mother    Fibroids Mother        uterine    Dementia Mother    Pancreatic cancer Father    Cancer Maternal Aunt        Breast   Breast cancer Maternal Aunt        late 76s   Cancer Maternal Aunt        Liver   Cancer Maternal Aunt        Throat   Heart disease Maternal Uncle        37s   Colon cancer Paternal Uncle    Clotting disorder Maternal Grandmother    Diabetes Maternal Grandmother    Liver cancer Maternal Grandfather    Diabetes Maternal Grandfather    Pancreatic cancer Paternal Grandmother    Diabetes Paternal Grandmother    Stomach cancer Paternal Grandfather    Diabetes Paternal Grandfather    Breast cancer Cousin        early 65s   Esophageal cancer Neg Hx    Sleep apnea  Neg Hx     Social History Social History   Tobacco Use   Smoking status: Never   Smokeless tobacco: Never  Vaping Use   Vaping Use: Never used  Substance Use Topics   Alcohol use: Yes    Comment: socially   Drug use: No     Allergies   Ciprofloxacin, Quinolones, and Bee venom   Review of Systems Review of Systems  Respiratory:  Positive for cough.   Per HPI   Physical Exam Triage Vital Signs ED Triage Vitals  Enc Vitals Group     BP 03/02/23 1028 139/83     Pulse Rate 03/02/23 1028 94     Resp 03/02/23 1028 18     Temp 03/02/23 1028 98.6 F (37 C)     Temp Source 03/02/23 1028 Oral     SpO2 03/02/23 1028 95 %     Weight --      Height --      Head Circumference --      Peak Flow --      Pain Score 03/02/23 1031 0     Pain Loc --      Pain Edu? --      Excl. in GC? --    No data found.  Updated Vital Signs BP 139/83 (BP Location: Left Arm)   Pulse 94   Temp 98.6 F (37 C) (Oral)   Resp 18   SpO2 95%   Visual Acuity Right Eye Distance:   Left Eye Distance:   Bilateral Distance:    Right Eye Near:   Left Eye Near:    Bilateral Near:     Physical Exam Vitals and nursing note reviewed.  Constitutional:      Appearance: She is not ill-appearing or toxic-appearing.  HENT:     Head: Normocephalic and atraumatic.     Right Ear: Hearing, tympanic membrane, ear canal and external ear normal.     Left Ear: Hearing, tympanic membrane, ear canal and external ear normal.     Nose: Rhinorrhea present. Rhinorrhea is clear.  Mouth/Throat:     Lips: Pink.     Mouth: Mucous membranes are moist. No injury.     Tongue: No lesions. Tongue does not deviate from midline.     Palate: No mass and lesions.     Pharynx: Oropharynx is clear. Uvula midline. No pharyngeal swelling, oropharyngeal exudate, posterior oropharyngeal erythema or uvula swelling.     Tonsils: No tonsillar exudate or tonsillar abscesses.     Comments: Small amount of clear postnasal  drainage visualized to the posterior oropharynx.  Eyes:     General: Lids are normal. Vision grossly intact. Gaze aligned appropriately.     Extraocular Movements: Extraocular movements intact.     Conjunctiva/sclera: Conjunctivae normal.  Cardiovascular:     Rate and Rhythm: Normal rate and regular rhythm.     Heart sounds: Normal heart sounds, S1 normal and S2 normal.  Pulmonary:     Effort: Pulmonary effort is normal. No respiratory distress.     Breath sounds: Normal breath sounds and air entry. No decreased breath sounds, wheezing, rhonchi or rales.  Musculoskeletal:     Cervical back: Neck supple. No tenderness.  Lymphadenopathy:     Cervical: No cervical adenopathy.  Skin:    General: Skin is warm and dry.     Capillary Refill: Capillary refill takes less than 2 seconds.     Findings: No rash.  Neurological:     General: No focal deficit present.     Mental Status: She is alert and oriented to person, place, and time. Mental status is at baseline.     Cranial Nerves: No dysarthria or facial asymmetry.  Psychiatric:        Mood and Affect: Mood normal.        Speech: Speech normal.        Behavior: Behavior normal.        Thought Content: Thought content normal.        Judgment: Judgment normal.      UC Treatments / Results  Labs (all labs ordered are listed, but only abnormal results are displayed) Labs Reviewed - No data to display  EKG   Radiology No results found.  Procedures Procedures (including critical care time)  Medications Ordered in UC Medications  dexamethasone (DECADRON) injection 10 mg (has no administration in time range)    Initial Impression / Assessment and Plan / UC Course  I have reviewed the triage vital signs and the nursing notes.  Pertinent labs & imaging results that were available during my care of the patient were reviewed by me and considered in my medical decision making (see chart for details).   1.  Persistent dry cough,  acute bronchitis, GERD Unclear etiology of patient's cough, however I suspect acute bronchitis.  Albuterol inhaler has helped slightly but not very much.  Tessalon Perles have not been helping at all.  Lungs are clear, therefore deferred imaging.  Will manage this with as needed use of albuterol, dexamethasone IM 10 mg in clinic, and as needed use of Delsym at home.  May use Promethazine DM at bedtime.  I suspect her acid reflux is also contributing to her cough and therefore will add on Pepcid to her GERD medication regimen.  Advised to follow-up with primary care to discuss this further as she may benefit from another visit to the gastroenterology specialist.  Vital signs are hemodynamically stable and she is nontoxic in appearance.  Encouraged to continue using her CPAP.  She is agreeable with plan.  Of note, patient mentioned that she recognize the name enalapril medication and believes this was prescribed to her by her primary care for blood pressure management.  She states she believes she picked this up from [facility] recently.  I personally called [facility] to ensure that she is not on an ACE inhibitor as this could be causing her cough.  [facility] pharmacy confirmed she is not on an ACE inhibitor.  Discussed physical exam and available lab work findings in clinic with patient.  Counseled patient regarding appropriate use of medications and potential side effects for all medications recommended or prescribed today. Discussed red flag signs and symptoms of worsening condition,when to call the PCP office, return to urgent care, and when to seek higher level of care in the emergency department. Patient verbalizes understanding and agreement with plan. All questions answered. Patient discharged in stable condition.    Final Clinical Impressions(s) / UC Diagnoses   Final diagnoses:  Persistent dry cough  Gastroesophageal reflux disease with esophagitis without hemorrhage     Discharge  Instructions      It is unclear what is causing your persistent cough, however I suspect you may have bronchitis (viral upper respiratory infection).   I also suspect he may have worsening cough due to your acid reflux.  You may purchase Delsym over-the-counter and take this to help with your cough as needed during the daytime. At bedtime, you may use Promethazine DM.  Do not use this during the daytime as it can make you very sleepy.  Do not drive or drink alcohol while using this for the same reason. Continue guaifenesin as this will help to thin your mucus.  I gave you a shot of steroid in the clinic to help calm down the inflammation to your chest.  This will continue working in your body over the next 2 to 3 days to help with your cough.  Add Pepcid to your acid reflux medication regimen.  Take Pepcid 20 mg twice daily.   Continue taking your allergy medications.  Please schedule an appointment with your primary care provider for ongoing evaluation and management of your symptoms.  If you develop any new or worsening symptoms or do not improve in the next 2 to 3 days, please return.  If your symptoms are severe, please go to the emergency room.  Follow-up with your primary care provider for further evaluation and management of your symptoms as well as ongoing wellness visits.  I hope you feel better!     ED Prescriptions     Medication Sig Dispense Auth. Provider   famotidine (PEPCID) 20 MG tablet Take 1 tablet (20 mg total) by mouth 2 (two) times daily. 30 tablet Carlisle Beers, FNP   promethazine-dextromethorphan (PROMETHAZINE-DM) 6.25-15 MG/5ML syrup Take 5 mLs by mouth at bedtime as needed for cough. 118 mL Carlisle Beers, FNP      PDMP not reviewed this encounter.   Carlisle Beers, [place] 03/02/23 1119

## 2023-03-02 NOTE — ED Triage Notes (Signed)
Pt reports dry cough x 1 week, after she is using the CPAP. Cough is worse at night. Tessalon and Mucinex gives no relief.

## 2023-03-08 DIAGNOSIS — R400 Somnolence: Secondary | ICD-10-CM | POA: Diagnosis not present

## 2023-03-08 DIAGNOSIS — G4733 Obstructive sleep apnea (adult) (pediatric): Secondary | ICD-10-CM | POA: Diagnosis not present

## 2023-04-02 DIAGNOSIS — Z7984 Long term (current) use of oral hypoglycemic drugs: Secondary | ICD-10-CM | POA: Diagnosis not present

## 2023-04-02 DIAGNOSIS — E119 Type 2 diabetes mellitus without complications: Secondary | ICD-10-CM | POA: Diagnosis not present

## 2023-04-02 DIAGNOSIS — E1165 Type 2 diabetes mellitus with hyperglycemia: Secondary | ICD-10-CM | POA: Diagnosis not present

## 2023-04-02 DIAGNOSIS — Z6835 Body mass index (BMI) 35.0-35.9, adult: Secondary | ICD-10-CM | POA: Diagnosis not present

## 2023-04-02 DIAGNOSIS — E039 Hypothyroidism, unspecified: Secondary | ICD-10-CM | POA: Diagnosis not present

## 2023-04-05 ENCOUNTER — Other Ambulatory Visit: Payer: Self-pay

## 2023-04-05 ENCOUNTER — Ambulatory Visit
Admission: EM | Admit: 2023-04-05 | Discharge: 2023-04-05 | Disposition: A | Discharge status: Home / Self Care | Payer: Federal, State, Local not specified - PPO | Attending: Nurse Practitioner | Admitting: Nurse Practitioner

## 2023-04-05 ENCOUNTER — Telehealth: Payer: Self-pay | Admitting: Family Medicine

## 2023-04-05 ENCOUNTER — Emergency Department (HOSPITAL_COMMUNITY)
Admission: EM | Admit: 2023-04-05 | Discharge: 2023-04-05 | Discharge status: Against Medical Advice | Payer: Federal, State, Local not specified - PPO

## 2023-04-05 ENCOUNTER — Encounter: Payer: Self-pay | Admitting: *Deleted

## 2023-04-05 DIAGNOSIS — R052 Subacute cough: Secondary | ICD-10-CM

## 2023-04-05 MED ORDER — BENZONATATE 100 MG PO CAPS: 100.0000 mg | ORAL_CAPSULE | Freq: Three times a day (TID) | ORAL | 0 refills | Status: DC

## 2023-04-05 MED ORDER — PREDNISONE 10 MG PO TABS: 20.0000 mg | ORAL_TABLET | Freq: Every day | ORAL | 0 refills | Status: DC

## 2023-04-05 MED ORDER — PROMETHAZINE-DM 6.25-15 MG/5ML PO SYRP: 5.0000 mL | ORAL_SOLUTION | Freq: Four times a day (QID) | ORAL | 0 refills | Status: DC | PRN

## 2023-04-05 NOTE — ED Provider Notes (Signed)
[facility]    CSN: [id number] Arrival date & time: 04/05/23  1554      History   Chief Complaint Chief Complaint  Patient presents with   Cough    HPI Melanie Roberts is a 51 y.o. female.   HPI  She is in today for evaluation of persistent cough.  She has a recent history of COVID and influenza in the last 3 months.  She has been treated accordingly.  She has a history of allergic rhinitis.  She reports regular treatment.  She continues to have left eye watery drainage.  She is currently using Visine.  She reports that she is pale [place] in the past.  Her major concern today is the persistent cough.  She reports that she works third she have and her cough is worse at night.  She works in the [employer].  She feels like they have been doing Holiday representative.  She endorses that a few other people have been sick.  She has also been under increased stress with the passing of her mother over the last month.  She reports visiting her endocrinologist this week and that her diabetes is controlled.  She denies any fever, chills, headache or dizziness.  She also denies chest pain.  She reports that she continues to use the cough medication without much relief.   Past Medical History:  Diagnosis Date   Allergy    Diabetes mellitus without complication (HCC)    GERD (gastroesophageal reflux disease)    Headache(784.0)    migraines   Hemorrhoids 12/2012   Hypertension    Hypothyroidism    Seasonal allergies    UTI (urinary tract infection)    Wears partial dentures    upper and lower    Patient Active Problem List   Diagnosis Date Noted   Macromastia 03/17/2022   Thyroid nodule 09/27/2021   Gastroesophageal reflux disease 09/27/2021   Low back pain 09/27/2021   Pedal edema 09/27/2021   Plantar fasciitis 09/27/2021   Essential hypertension 09/27/2021   Type 2 diabetes mellitus (HCC) 04/06/2021   Vitamin D deficiency 10/05/2020   Pain in right elbow 11/13/2019    Impingement syndrome of right shoulder 10/29/2019   Nontraumatic incomplete tear of right rotator cuff 10/29/2019   Gastric nodule    Hypothyroidism due to Hashimoto's thyroiditis 01/26/2017   Hyperlipidemia 01/26/2017   Bee sting allergy 05/14/2016   Idiopathic urticaria 09/29/2015    Past Surgical History:  Procedure Laterality Date   ADENOIDECTOMY  02/17/2005   CESAREAN SECTION  07/08/2003   ESOPHAGOGASTRODUODENOSCOPY (EGD) WITH PROPOFOL N/A 06/19/2019   Procedure: ESOPHAGOGASTRODUODENOSCOPY (EGD) WITH PROPOFOL;  Surgeon: Rachael Fee, MD;  Location: [facility];  Service: Gastroenterology;  Laterality: N/A;   EUS N/A 06/19/2019   Procedure: UPPER ENDOSCOPIC ULTRASOUND (EUS) RADIAL;  Surgeon: Rachael Fee, MD;  Location: [facility];  Service: Gastroenterology;  Laterality: N/A;   EVALUATION UNDER ANESTHESIA WITH HEMORRHOIDECTOMY N/A 01/14/2013   Procedure: EXAM UNDER ANESTHESIA WITH HEMORRHOIDECTOMY POSSIBLE BANDING;  Surgeon: Lodema Pilot, DO;  Location: [facility];  Service: General;  Laterality: N/A;   fibroids removed     LAPAROSCOPIC TOTAL HYSTERECTOMY  10/29/2006   MYOMECTOMY  12/18/2001   x 4   NASAL TURBINATE REDUCTION Bilateral 02/07/2005   inferior and removed adenoids   ROTATOR CUFF REPAIR Right 2020   TONSILLECTOMY AND ADENOIDECTOMY  age 65   TUBAL LIGATION  07/08/2003    OB History   No obstetric history  on file.      Home Medications    Prior to Admission medications   Medication Sig Start Date End Date Taking? Authorizing Provider  albuterol (VENTOLIN HFA) 108 (90 Base) MCG/ACT inhaler Inhale 1-2 puffs into the lungs every 6 (six) hours as needed for wheezing or shortness of breath. 12/02/22  Yes Mound, Rolly Salter E, FNP  amLODipine (NORVASC) 10 MG tablet TAKE 1 TABLET(10 MG) BY MOUTH DAILY 10/20/22  Yes Loyola Mast, MD  atorvastatin (LIPITOR) 20 MG tablet TAKE 1 TABLET(20 MG) BY MOUTH DAILY 11/16/22  Yes Loyola Mast, MD   cetirizine (ZYRTEC) 10 MG tablet Take 1 tablet (10 mg total) by mouth daily. 06/29/21  Yes Worthy Rancher B, FNP  ergocalciferol (VITAMIN D2) 1.25 MG (50000 UT) capsule Take 1 capsule by mouth every 7 (seven) days. 08/27/20  Yes [provider]  estradiol (ESTRACE) 0.5 MG tablet TAKE 1 TABLET(0.5 MG) BY MOUTH DAILY 02/19/23  Yes Loyola Mast, MD  famotidine (PEPCID) 20 MG tablet Take 1 tablet (20 mg total) by mouth 2 (two) times daily. 03/02/23  Yes Carlisle Beers, FNP  fluticasone (FLONASE) 50 MCG/ACT nasal spray SHAKE LIQUID AND USE 2 SPRAYS IN EACH NOSTRIL DAILY 04/22/20  Yes Cirigliano, Mary K, DO  furosemide (LASIX) 20 MG tablet TAKE 1 TABLET BY MOUTH DAILY AS NEEDED 10/19/20  Yes Cirigliano, Jearld Lesch, DO  ibuprofen (ADVIL) 600 MG tablet  11/25/20  Yes [provider]  Lancets (ONETOUCH DELICA PLUS LANCET30G) MISC daily. as directed 08/03/21  Yes [provider]  metFORMIN (GLUCOPHAGE-XR) 500 MG 24 hr tablet Take by mouth. 08/16/21  Yes [provider]  naproxen (NAPROSYN) 375 MG tablet Take 1 tablet (375 mg total) by mouth 2 (two) times daily. 07/29/19  Yes Palumbo, April, MD  ondansetron (ZOFRAN) 4 MG tablet Take 1 tablet (4 mg total) by mouth every 8 (eight) hours as needed for nausea or vomiting. 11/06/19  Yes Cristie Hem, PA-C  ONETOUCH VERIO test strip daily. 08/03/21  Yes [provider]  pantoprazole (PROTONIX) 40 MG tablet Take 1 tablet (40 mg total) by mouth 2 (two) times daily. 05/19/22  Yes Cirigliano, Vito V, DO  predniSONE (DELTASONE) 10 MG tablet Take 2 tablets (20 mg total) by mouth daily. 04/05/23  Yes Barbette Merino, NP  sucralfate (CARAFATE) 1 g tablet Take 1 tablet (1 g total) by mouth 4 (four) times daily -  with meals and at bedtime. 03/17/22  Yes Loyola Mast, MD  TIROSINT 200 MCG CAPS Take by mouth. 08/16/21  Yes [provider]  TIROSINT 25 MCG CAPS Take 1 capsule by mouth daily. 08/17/21  Yes [provider]  EPINEPHrine (EPIPEN 2-PAK) 0.3 mg/0.3 mL IJ SOAJ injection Inject 0.3 mLs (0.3 mg total) into the muscle as needed (allergic reaction). 05/15/16   Whiteheart, Mellody Life, NP  promethazine-dextromethorphan (PROMETHAZINE-DM) 6.25-15 MG/5ML syrup Take 5 mLs by mouth 4 (four) times daily as needed for cough. 04/05/23   Zenia Resides, MD    Family History Family History  Problem Relation Age of Onset   Diabetes Mother    Fibroids Mother        uterine    Dementia Mother    Pancreatic cancer Father    Cancer Maternal Aunt        Breast   Breast cancer Maternal Aunt        late 3s   Cancer Maternal Aunt  Liver   Cancer Maternal Aunt        Throat   Heart disease Maternal Uncle        47s   Colon cancer Paternal Uncle    Clotting disorder Maternal Grandmother    Diabetes Maternal Grandmother    Liver cancer Maternal Grandfather    Diabetes Maternal Grandfather    Pancreatic cancer Paternal Grandmother    Diabetes Paternal Grandmother    Stomach cancer Paternal Grandfather    Diabetes Paternal Grandfather    Breast cancer Cousin        early 48s   Esophageal cancer Neg Hx    Sleep apnea Neg Hx     Social History Social History   Tobacco Use   Smoking status: Never   Smokeless tobacco: Never  Vaping Use   Vaping Use: Never used  Substance Use Topics   Alcohol use: Yes    Comment: socially   Drug use: No     Allergies   Ciprofloxacin, Quinolones, and Bee venom   Review of Systems Review of Systems   Physical Exam Triage Vital Signs ED Triage Vitals  Enc Vitals Group     BP 04/05/23 1625 119/89     Pulse Rate 04/05/23 1625 79     Resp 04/05/23 1625 20     Temp 04/05/23 1625 98.3 F (36.8 C)     Temp src --      SpO2 04/05/23 1625 92 %     Weight --      Height --      Head Circumference --      Peak Flow --      Pain Score 04/05/23 1622 4     Pain Loc --      Pain Edu? --      Excl. in GC? --    No data found.  Updated  Vital Signs BP 119/89   Pulse 79   Temp 98.3 F (36.8 C)   Resp 20   SpO2 92%   Visual Acuity Right Eye Distance:   Left Eye Distance:   Bilateral Distance:    Right Eye Near:   Left Eye Near:    Bilateral Near:     Physical Exam Constitutional:      Appearance: She is obese.  HENT:     Head: Normocephalic.     Nose: Congestion present.     Mouth/Throat:     Mouth: Mucous membranes are moist.     Pharynx: No posterior oropharyngeal erythema.  Eyes:     Comments: Injected L> R  Cardiovascular:     Rate and Rhythm: Normal rate.     Pulses: Normal pulses.  Pulmonary:     Breath sounds: No stridor. No wheezing, rhonchi or rales.     Comments: diminshed Musculoskeletal:        General: Normal range of motion.     Cervical back: Normal range of motion.  Skin:    General: Skin is warm and dry.  Neurological:     General: No focal deficit present.     Mental Status: She is alert and oriented to person, place, and time.  Psychiatric:        Mood and Affect: Mood normal.        Behavior: Behavior normal.      UC Treatments / Results  Labs (all labs ordered are listed, but only abnormal results are displayed) Labs Reviewed - No data to display  EKG   Radiology  No results found.  Procedures Procedures (including critical care time)  Medications Ordered in UC Medications - No data to display  Initial Impression / Assessment and Plan / UC Course  I have reviewed the triage vital signs and the nursing notes.  Pertinent labs & imaging results that were available during my care of the patient were reviewed by me and considered in my medical decision making (see chart for details).     cough Final Clinical Impressions(s) / UC Diagnoses   Final diagnoses:  Subacute cough     Discharge Instructions      Due to your ongoing symptoms the recommendation is for you to have a repeat chest x-ray.  You have been provided with outpatient imaging due to imaging  not being onsite.  I will start you on a steroid Dosepak to possibly help with the bronchitis symptoms that you are experiencing.  Your hemoglobin A1c is 6.5.  Please be aware that the steroid, prednisone 10 mg take 2 tablets daily until completed.  Will cause your blood sugar to be elevated however it will normalize.  Be mindful of your diet during this time. You have also been prescribed benzonatate 100 mg every 8 hours as needed for cough.  The recommendation is to continue with current medications as prescribed.  I would advise you to have your primary care refer you to a pulmonologist for some pulmonary pulm function testing due to your recent bout of COVID and influenza.      ED Prescriptions     Medication Sig Dispense Auth. Provider   predniSONE (DELTASONE) 10 MG tablet Take 2 tablets (20 mg total) by mouth daily. 15 tablet Thad Ranger M, NP   benzonatate (TESSALON) 100 MG capsule Take 1 capsule (100 mg total) by mouth every 8 (eight) hours. 21 capsule Barbette Merino, NP      PDMP not reviewed this encounter.   Thad Ranger [place], [place] 04/06/23 1123

## 2023-04-05 NOTE — Telephone Encounter (Signed)
Patient called stating that Melanie Som do not work for her.  Phenergan with dextromethorphan is sent in instead.

## 2023-04-05 NOTE — ED Triage Notes (Signed)
Pt reports she has a cough . The cough went away but has restarted. Pt also reports Lt eye is tearing and she has a HA.

## 2023-04-05 NOTE — Patient Instructions (Addendum)
Please continue using your CPAP regularly. While your insurance requires that you use CPAP at least 4 hours each night on 70% of the nights, I recommend, that you not skip any nights and use it throughout the night if you can. Getting used to CPAP and staying with the treatment long term does take time and patience and discipline. Untreated obstructive sleep apnea when it is moderate to severe can have an adverse impact on cardiovascular health and raise her risk for heart disease, arrhythmias, hypertension, congestive heart failure, stroke and diabetes. Untreated obstructive sleep apnea causes sleep disruption, nonrestorative sleep, and sleep deprivation. This can have an impact on your day to day functioning and cause daytime sleepiness and impairment of cognitive function, memory loss, mood disturbance, and problems focussing. Using CPAP regularly can improve these symptoms.  I will send mask refitting orders to [facility]. They should call you to schedule visit. I will check compliance report in 4-6 weeks to make sure AHI is improving with use. Please continue trying to use CPAP every night. Keep an eye on your blood pressure.   Follow up in 3-4 months

## 2023-04-05 NOTE — Progress Notes (Signed)
PATIENT: Melanie Roberts DOB: 04-14-1971  REASON FOR VISIT: follow up HISTORY FROM: patient  Chief Complaint  Patient presents with   Follow-up    Pt in room 1. Here for cpap follow up. Pt reports she started back on cpap recently on Friday. Pt said last month her mother passed and pt recently diagnosed with bronchitis she was not able to use cpap during this time. Also reports having covid for second time.  Pt on antibiotic for bronchitis.      HISTORY OF PRESENT ILLNESS:  04/09/23 ALL:  Melanie Roberts is a 52 y.o. female here today for follow up for OSA on CPAP. She was seen in consult with Dr Frances Furbish 10/2022. HST 12/2022 showed severe OSA wit total AHI 76.4/h and O2 nadir 57%. Time below 88% was over 100 minutes indicating nocturnal hypoxemia. She was set up with [employer] and returns for initial compliance visit. Since, she reports little usage. She lost her mother to dementia 02/2023. She was diagnosed with Covid 01/2023. She has a lingering cough now being treated as bronchitis with abx. She did start using CPAP three days ago. She is using a nasal pillow with a chin strap. She does note air leaking. She is having a hard time tolerating therapy due to cough. 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 She does feel that she is able to breath a little better when using therapy.     HISTORY: (copied from Dr Teofilo Pod previous note)  Dear Dr. Veto Kemps,   I saw your patient, Melanie Roberts, upon your kind request in my sleep clinic today for initial consultation of her sleep disorder, in particular, concern for underlying obstructive sleep apnea.  The patient is unaccompanied today.  As you know, Melanie Roberts is a 52 year old female with an underlying medical history of allergies, headaches, hypertension, hypothyroidism, diabetes, reflux disease and obesity, who reports snoring and excessive daytime somnolence, as well as witnessed apneas, per sons' report.  Her  Epworth sleepiness score is 16/24, fatigue severity score is 57/63.  I reviewed your office note from 09/27/2022.  She endorses significant stressors including stress regarding her son, stress of taking care of her mom who has breast cancer and dementia and lives with patient.  The patient lives with her 2 younger sons as well, ages 98 and 92, she has an older son, age 64.  She works nights, she works for the post office.  She works from 7 PM to 3:30 AM with overtime till 5:30 AM at times.  Bedtime is generally very variable depending on how well her mom is sleeping.  She may be in bed by 4:45 AM but it could be as late as 1 PM.  Her rise time is also very variable.  If left to her own devices and if able to, she sleeps at night and she can sleep all day on days that she does not have to work.  She had a tonsillectomy and adenoidectomy as a child and then a repeat adenoidectomy at age 38 years ago.  She drinks caffeine in the form of soda, about 20 ounces per day, occasional coffee, occasional tea.  She drinks alcohol very occasionally, she is a non-smoker.  She has woken up with a sense of gasping.  She has nocturia about twice or 3 times per average night.  She has recurrent nocturnal and morning headaches.  They have 2 dogs in the household.   REVIEW OF SYSTEMS: Out  of a complete 14 system review of symptoms, the patient complains only of the following symptoms, sleepiness and all other reviewed systems are negative.  ESS: 18/24  ALLERGIES: Allergies  Allergen Reactions   Ciprofloxacin Anaphylaxis   Quinolones Anaphylaxis   Bee Venom Swelling and Rash    HOME MEDICATIONS: Outpatient Medications Prior to Visit  Medication Sig Dispense Refill   albuterol (VENTOLIN HFA) 108 (90 Base) MCG/ACT inhaler Inhale 1-2 puffs into the lungs every 6 (six) hours as needed for wheezing or shortness of breath. 1 each 0   amLODipine (NORVASC) 10 MG tablet TAKE 1 TABLET(10 MG) BY MOUTH DAILY 90 tablet 3    atorvastatin (LIPITOR) 20 MG tablet TAKE 1 TABLET(20 MG) BY MOUTH DAILY 90 tablet 3   cetirizine (ZYRTEC) 10 MG tablet Take 1 tablet (10 mg total) by mouth daily. 90 tablet 0   EPINEPHrine (EPIPEN 2-PAK) 0.3 mg/0.3 mL IJ SOAJ injection Inject 0.3 mLs (0.3 mg total) into the muscle as needed (allergic reaction). 1 Device 1   ergocalciferol (VITAMIN D2) 1.25 MG (50000 UT) capsule Take 1 capsule by mouth every 7 (seven) days.     estradiol (ESTRACE) 0.5 MG tablet TAKE 1 TABLET(0.5 MG) BY MOUTH DAILY 90 tablet 3   famotidine (PEPCID) 20 MG tablet Take 1 tablet (20 mg total) by mouth 2 (two) times daily. 30 tablet 0   fluticasone (FLONASE) 50 MCG/ACT nasal spray SHAKE LIQUID AND USE 2 SPRAYS IN EACH NOSTRIL DAILY 16 g 2   furosemide (LASIX) 20 MG tablet TAKE 1 TABLET BY MOUTH DAILY AS NEEDED 90 tablet 2   ibuprofen (ADVIL) 600 MG tablet      Lancets (ONETOUCH DELICA PLUS LANCET30G) MISC daily. as directed     Levothyroxine Sodium (TIROSINT) 50 MCG CAPS Take 50 mcg by mouth daily before breakfast.     metFORMIN (GLUCOPHAGE-XR) 500 MG 24 hr tablet Take by mouth.     naproxen (NAPROSYN) 375 MG tablet Take 1 tablet (375 mg total) by mouth 2 (two) times daily. 20 tablet 0   ondansetron (ZOFRAN) 4 MG tablet Take 1 tablet (4 mg total) by mouth every 8 (eight) hours as needed for nausea or vomiting. 40 tablet 0   ONETOUCH VERIO test strip daily.     pantoprazole (PROTONIX) 40 MG tablet Take 1 tablet (40 mg total) by mouth 2 (two) times daily. 60 tablet 3   predniSONE (DELTASONE) 10 MG tablet Take 2 tablets (20 mg total) by mouth daily. 15 tablet 0   promethazine-dextromethorphan (PROMETHAZINE-DM) 6.25-15 MG/5ML syrup Take 5 mLs by mouth 4 (four) times daily as needed for cough. 118 mL 0   sucralfate (CARAFATE) 1 g tablet Take 1 tablet (1 g total) by mouth 4 (four) times daily -  with meals and at bedtime. 48 tablet 0   TIROSINT 200 MCG CAPS Take by mouth.     TIROSINT 25 MCG CAPS Take 1 capsule by mouth  daily. (Patient not taking: Reported on 04/09/2023)     No facility-administered medications prior to visit.    PAST MEDICAL HISTORY: Past Medical History:  Diagnosis Date   Allergy    Diabetes mellitus without complication (HCC)    GERD (gastroesophageal reflux disease)    Headache(784.0)    migraines   Hemorrhoids 12/2012   Hypertension    Hypothyroidism    Seasonal allergies    UTI (urinary tract infection)    Wears partial dentures    upper and lower    PAST SURGICAL HISTORY:  Past Surgical History:  Procedure Laterality Date   ADENOIDECTOMY  02/17/2005   CESAREAN SECTION  07/08/2003   ESOPHAGOGASTRODUODENOSCOPY (EGD) WITH PROPOFOL N/A 06/19/2019   Procedure: ESOPHAGOGASTRODUODENOSCOPY (EGD) WITH PROPOFOL;  Surgeon: Rachael Fee, MD;  Location: [facility];  Service: Gastroenterology;  Laterality: N/A;   EUS N/A 06/19/2019   Procedure: UPPER ENDOSCOPIC ULTRASOUND (EUS) RADIAL;  Surgeon: Rachael Fee, MD;  Location: [facility];  Service: Gastroenterology;  Laterality: N/A;   EVALUATION UNDER ANESTHESIA WITH HEMORRHOIDECTOMY N/A 01/14/2013   Procedure: EXAM UNDER ANESTHESIA WITH HEMORRHOIDECTOMY POSSIBLE BANDING;  Surgeon: Lodema Pilot, DO;  Location: [facility];  Service: General;  Laterality: N/A;   fibroids removed     LAPAROSCOPIC TOTAL HYSTERECTOMY  10/29/2006   MYOMECTOMY  12/18/2001   x 4   NASAL TURBINATE REDUCTION Bilateral 02/07/2005   inferior and removed adenoids   ROTATOR CUFF REPAIR Right 2020   TONSILLECTOMY AND ADENOIDECTOMY  age 30   TUBAL LIGATION  07/08/2003    FAMILY HISTORY: Family History  Problem Relation Age of Onset   Diabetes Mother    Fibroids Mother        uterine    Dementia Mother    Pancreatic cancer Father    Cancer Maternal Aunt        Breast   Breast cancer Maternal Aunt        late 75s   Cancer Maternal Aunt        Liver   Cancer Maternal Aunt        Throat   Heart disease Maternal Uncle         58s   Colon cancer Paternal Uncle    Clotting disorder Maternal Grandmother    Diabetes Maternal Grandmother    Liver cancer Maternal Grandfather    Diabetes Maternal Grandfather    Pancreatic cancer Paternal Grandmother    Diabetes Paternal Grandmother    Stomach cancer Paternal Grandfather    Diabetes Paternal Grandfather    Breast cancer Cousin        early 39s   Esophageal cancer Neg Hx    Sleep apnea Neg Hx     SOCIAL HISTORY: Social History   Socioeconomic History   Marital status: Divorced    Spouse name: Not on file   Number of children: 3   Years of education: Not on file   Highest education level: Not on file  Occupational History   Occupation: Naval architect    Comment: [employer]  Tobacco Use   Smoking status: Never   Smokeless tobacco: Never  Vaping Use   Vaping Use: Never used  Substance and Sexual Activity   Alcohol use: Yes    Comment: socially   Drug use: No   Sexual activity: Yes  Other Topics Concern   Not on file  Social History Narrative   Not on file   Social Determinants of Health   Financial Resource Strain: Not on file  Food Insecurity: Not on file  Transportation Needs: Not on file  Physical Activity: Not on file  Stress: Not on file  Social Connections: Not on file  Intimate Partner Violence: Not on file     PHYSICAL EXAM  Vitals:   04/09/23 0816  BP: (!) 135/97  Pulse: 76  Weight: 242 lb 9.6 oz (110 kg)  Height: 5\' 7"  (1.702 m)   Body mass index is 38 kg/m.  Generalized: Well developed, in no acute distress  Cardiology: normal rate and rhythm, no murmur noted  Respiratory: clear to auscultation bilaterally  Neurological examination  Mentation: Alert oriented to time, place, history taking. Follows all commands speech and language fluent Cranial nerve II-XII: Pupils were equal round reactive to light. Extraocular movements were full, visual field were full  Motor: The motor testing reveals 5 over 5 strength of all 4 extremities.  Good symmetric motor tone is noted throughout.  Gait and station: Gait is normal.    DIAGNOSTIC DATA (LABS, IMAGING, TESTING) - I reviewed patient records, labs, notes, testing and imaging myself where available.      No data to display           Lab Results  Component Value Date   WBC 6.0 07/21/2022   HGB 11.4 (L) 07/21/2022   HCT 35.6 (L) 07/21/2022   MCV 91.0 07/21/2022   PLT 287 07/21/2022      Component Value Date/Time   NA 142 07/21/2022 0559   K 4.0 07/21/2022 0559   CL 103 07/21/2022 0559   CO2 29 07/21/2022 0559   GLUCOSE 121 (H) 07/21/2022 0559   BUN 13 07/21/2022 0559   CREATININE 0.91 07/21/2022 0559   CREATININE 0.65 09/28/2015 1737   CALCIUM 9.8 07/21/2022 0559   PROT 6.7 09/28/2015 1737   ALBUMIN 3.9 09/28/2015 1737   AST 17 09/25/2018 0936   ALT 17 09/25/2018 0936   ALKPHOS 35 09/28/2015 1737   BILITOT 0.6 09/28/2015 1737   GFRNONAA >60 07/21/2022 0559   GFRNONAA >89 09/28/2015 1737   GFRAA >60 05/15/2016 0311   GFRAA >89 09/28/2015 1737   Lab Results  Component Value Date   CHOL 179 12/28/2021   HDL 54.80 12/28/2021   LDLCALC 99 12/28/2021   TRIG 128.0 12/28/2021   CHOLHDL 3 12/28/2021   Lab Results  Component Value Date   HGBA1C 6.3 09/27/2021   No results found for: "VITAMINB12" No results found for: "TSH"   ASSESSMENT AND PLAN 52 y.o. year old female  has a past medical history of Allergy, Diabetes mellitus without complication (HCC), GERD (gastroesophageal reflux disease), Headache(784.0), Hemorrhoids (12/2012), Hypertension, Hypothyroidism, Seasonal allergies, UTI (urinary tract infection), and Wears partial dentures. here with     ICD-10-CM   1. OSA on CPAP  G47.33 For home use only DME continuous positive airway pressure (CPAP)       Melanie Roberts is doing well on CPAP therapy. Compliance report reveals supoptimal compliance. She was encouraged to continue using CPAP nightly and for greater than 4 hours each night. I  will send mask refitting orders. We will repeat download review in 4-6 weeks. Risks of untreated sleep apnea review and education materials provided. Healthy lifestyle habits encouraged. She will follow up in 3-4, sooner if needed. She verbalizes understanding and agreement with this plan.    Orders Placed This Encounter  Procedures   For home use only DME continuous positive airway pressure (CPAP)    Mask refitting please due to air leak    Order Specific Question:   Length of Need    Answer:   Lifetime    Order Specific Question:   Patient has OSA or probable OSA    Answer:   Yes    Order Specific Question:   Is the patient currently using CPAP in the home    Answer:   Yes    Order Specific Question:   Settings    Answer:   Autotitration    Order Specific Question:   CPAP supplies needed    Answer:  Mask, headgear, cushions, filters, heated tubing and water chamber     No orders of the defined types were placed in this encounter.    I spent 30 minutes of face-to-face and non-face-to-face time with patient.  This included previsit chart review, lab review, study review, order entry, electronic health record documentation, patient education.   Shawnie Dapper, FNP-C 04/09/2023, 9:09 AM [facility] [address], [address] [place], [place] [address] [phone]

## 2023-04-05 NOTE — Discharge Instructions (Addendum)
Due to your ongoing symptoms the recommendation is for you to have a repeat chest x-ray.  You have been provided with outpatient imaging due to imaging not being onsite.  I will start you on a steroid Dosepak to possibly help with the bronchitis symptoms that you are experiencing.  Your hemoglobin A1c is 6.5.  Please be aware that the steroid, prednisone 10 mg take 2 tablets daily until completed.  Will cause your blood sugar to be elevated however it will normalize.  Be mindful of your diet during this time. You have also been prescribed benzonatate 100 mg every 8 hours as needed for cough.  The recommendation is to continue with current medications as prescribed.  I would advise you to have your primary care refer you to a pulmonologist for some pulmonary pulm function testing due to your recent bout of COVID and influenza.

## 2023-04-08 DIAGNOSIS — G4733 Obstructive sleep apnea (adult) (pediatric): Secondary | ICD-10-CM | POA: Diagnosis not present

## 2023-04-08 DIAGNOSIS — R400 Somnolence: Secondary | ICD-10-CM | POA: Diagnosis not present

## 2023-04-09 ENCOUNTER — Ambulatory Visit: Payer: Federal, State, Local not specified - PPO | Admitting: Family Medicine

## 2023-04-09 ENCOUNTER — Encounter: Payer: Self-pay | Admitting: Family Medicine

## 2023-04-09 VITALS — BP 135/97 | HR 76 | Ht 67.0 in | Wt 242.6 lb

## 2023-04-09 DIAGNOSIS — G4733 Obstructive sleep apnea (adult) (pediatric): Secondary | ICD-10-CM

## 2023-05-09 ENCOUNTER — Encounter: Payer: Self-pay | Admitting: Internal Medicine

## 2023-05-09 ENCOUNTER — Ambulatory Visit: Payer: Federal, State, Local not specified - PPO | Admitting: Internal Medicine

## 2023-05-09 ENCOUNTER — Ambulatory Visit (INDEPENDENT_AMBULATORY_CARE_PROVIDER_SITE_OTHER): Payer: Federal, State, Local not specified - PPO

## 2023-05-09 VITALS — BP 118/84 | HR 70 | Temp 98.1°F | Ht 67.0 in | Wt 240.0 lb

## 2023-05-09 DIAGNOSIS — R053 Chronic cough: Secondary | ICD-10-CM | POA: Diagnosis not present

## 2023-05-09 DIAGNOSIS — R0602 Shortness of breath: Secondary | ICD-10-CM | POA: Diagnosis not present

## 2023-05-09 NOTE — Patient Instructions (Addendum)
I will be in touch with chest xray results  Continue albuterol and cough syrup as needed

## 2023-05-09 NOTE — Progress Notes (Signed)
[facility] [address] [place] [address] Dept: [phone] Dept Fax: [phone]  Acute Care Office Visit  Subjective:   Melanie Roberts [birth year] 05/09/2023  Chief Complaint  Patient presents with   Cough    Cough goes away and come back started in May    HPI: Discussed the use of AI scribe software for clinical note transcription with the patient, who gave verbal consent to proceed.  History of Present Illness   The patient, with a history of diabetes and sleep apnea, presents with a chronic, dry cough that has been ongoing for over a year. The cough is described as 'strangling'. Patient had COVID in February 2024. Since then, has been evaluated at urgent cares for chronic cough. She has tried starting pepcid along with her protonix for possible worsening GERD causing cough, with no improvement of cough.She takes zyrtec for allergies, but has not helped symptoms. Denies nasal congestion, runny nose, sinus pressure.  She has used an albuterol inhaler, prednisone steroid pack tessalon perles and promethazine DM. No relief with tessalon perles. She states she only had some short term relief with the albuterol inhaler and steroid pack. She states the cough would start to go away while taking the steroid pack, but then come right back within 1-2 weeks. 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 Reports shortness of breath, but not with exertion. The patient denies any history of smoking or asthma/COPD, but mentions exposure to secondhand smoke by family members.The patient also uses a CPAP machine for sleep apnea, but finds it difficult to use due to the cough.       The following portions of the patient's history were reviewed and updated as appropriate: past medical history, past surgical  history, family history, social history, allergies, medications, and problem list.   Patient Active Problem List   Diagnosis Date Noted   Macromastia 03/17/2022   Thyroid nodule 09/27/2021   Gastroesophageal reflux disease 09/27/2021   Low back pain 09/27/2021   Pedal edema 09/27/2021   Plantar fasciitis 09/27/2021   Essential hypertension 09/27/2021   Type 2 diabetes mellitus (HCC) 04/06/2021   Vitamin D deficiency 10/05/2020   Pain in right elbow 11/13/2019   Impingement syndrome of right shoulder 10/29/2019   Nontraumatic incomplete tear of right rotator cuff 10/29/2019   Gastric nodule    Hypothyroidism due to Hashimoto's thyroiditis 01/26/2017   Hyperlipidemia 01/26/2017   Bee sting allergy 05/14/2016   Idiopathic urticaria 09/29/2015   Past Medical History:  Diagnosis Date   Allergy    Diabetes mellitus without complication (HCC)    GERD (gastroesophageal reflux disease)    Headache(784.0)    migraines   Hemorrhoids 12/2012   Hypertension    Hypothyroidism    Seasonal allergies    UTI (urinary tract infection)    Wears partial dentures    upper and lower   Past Surgical History:  Procedure Laterality Date   ADENOIDECTOMY  02/17/2005   CESAREAN SECTION  07/08/2003   ESOPHAGOGASTRODUODENOSCOPY (EGD) WITH PROPOFOL N/A 06/19/2019   Procedure: ESOPHAGOGASTRODUODENOSCOPY (EGD) WITH PROPOFOL;  Surgeon: Rachael Fee, MD;  Location: [facility];  Service: Gastroenterology;  Laterality: N/A;   EUS N/A 06/19/2019   Procedure: UPPER ENDOSCOPIC ULTRASOUND (EUS) RADIAL;  Surgeon: Rachael Fee, MD;  Location: [facility];  Service: Gastroenterology;  Laterality: N/A;  EVALUATION UNDER ANESTHESIA WITH HEMORRHOIDECTOMY N/A 01/14/2013   Procedure: EXAM UNDER ANESTHESIA WITH HEMORRHOIDECTOMY POSSIBLE BANDING;  Surgeon: Lodema Pilot, DO;  Location: [facility];  Service: General;  Laterality: N/A;   fibroids removed     LAPAROSCOPIC TOTAL HYSTERECTOMY   10/29/2006   MYOMECTOMY  12/18/2001   x 4   NASAL TURBINATE REDUCTION Bilateral 02/07/2005   inferior and removed adenoids   ROTATOR CUFF REPAIR Right 2020   TONSILLECTOMY AND ADENOIDECTOMY  age 20   TUBAL LIGATION  07/08/2003   Family History  Problem Relation Age of Onset   Diabetes Mother    Fibroids Mother        uterine    Dementia Mother    Pancreatic cancer Father    Cancer Maternal Aunt        Breast   Breast cancer Maternal Aunt        late 57s   Cancer Maternal Aunt        Liver   Cancer Maternal Aunt        Throat   Heart disease Maternal Uncle        62s   Colon cancer Paternal Uncle    Clotting disorder Maternal Grandmother    Diabetes Maternal Grandmother    Liver cancer Maternal Grandfather    Diabetes Maternal Grandfather    Pancreatic cancer Paternal Grandmother    Diabetes Paternal Grandmother    Stomach cancer Paternal Grandfather    Diabetes Paternal Grandfather    Breast cancer Cousin        early 65s   Esophageal cancer Neg Hx    Sleep apnea Neg Hx    Outpatient Medications Prior to Visit  Medication Sig Dispense Refill   albuterol (VENTOLIN HFA) 108 (90 Base) MCG/ACT inhaler Inhale 1-2 puffs into the lungs every 6 (six) hours as needed for wheezing or shortness of breath. 1 each 0   amLODipine (NORVASC) 10 MG tablet TAKE 1 TABLET(10 MG) BY MOUTH DAILY 90 tablet 3   atorvastatin (LIPITOR) 20 MG tablet TAKE 1 TABLET(20 MG) BY MOUTH DAILY 90 tablet 3   cetirizine (ZYRTEC) 10 MG tablet Take 1 tablet (10 mg total) by mouth daily. 90 tablet 0   EPINEPHrine (EPIPEN 2-PAK) 0.3 mg/0.3 mL IJ SOAJ injection Inject 0.3 mLs (0.3 mg total) into the muscle as needed (allergic reaction). 1 Device 1   ergocalciferol (VITAMIN D2) 1.25 MG (50000 UT) capsule Take 1 capsule by mouth every 7 (seven) days.     estradiol (ESTRACE) 0.5 MG tablet TAKE 1 TABLET(0.5 MG) BY MOUTH DAILY 90 tablet 3   famotidine (PEPCID) 20 MG tablet Take 1 tablet (20 mg total) by mouth 2  (two) times daily. 30 tablet 0   fluticasone (FLONASE) 50 MCG/ACT nasal spray SHAKE LIQUID AND USE 2 SPRAYS IN EACH NOSTRIL DAILY 16 g 2   furosemide (LASIX) 20 MG tablet TAKE 1 TABLET BY MOUTH DAILY AS NEEDED 90 tablet 2   ibuprofen (ADVIL) 600 MG tablet      Levothyroxine Sodium (TIROSINT) 50 MCG CAPS Take 50 mcg by mouth daily before breakfast.     metFORMIN (GLUCOPHAGE-XR) 500 MG 24 hr tablet Take by mouth.     naproxen (NAPROSYN) 375 MG tablet Take 1 tablet (375 mg total) by mouth 2 (two) times daily. 20 tablet 0   ondansetron (ZOFRAN) 4 MG tablet Take 1 tablet (4 mg total) by mouth every 8 (eight) hours as needed for nausea or vomiting. 40 tablet 0  ONETOUCH VERIO test strip daily.     pantoprazole (PROTONIX) 40 MG tablet Take 1 tablet (40 mg total) by mouth 2 (two) times daily. 60 tablet 3   TIROSINT 200 MCG CAPS Take by mouth.     TIROSINT 25 MCG CAPS Take 1 capsule by mouth daily.     Lancets (ONETOUCH DELICA PLUS LANCET30G) MISC daily. as directed (Patient not taking: Reported on 05/09/2023)     predniSONE (DELTASONE) 10 MG tablet Take 2 tablets (20 mg total) by mouth daily. 15 tablet 0   promethazine-dextromethorphan (PROMETHAZINE-DM) 6.25-15 MG/5ML syrup Take 5 mLs by mouth 4 (four) times daily as needed for cough. 118 mL 0   sucralfate (CARAFATE) 1 g tablet Take 1 tablet (1 g total) by mouth 4 (four) times daily -  with meals and at bedtime. 48 tablet 0   No facility-administered medications prior to visit.   Allergies  Allergen Reactions   Ciprofloxacin Anaphylaxis   Quinolones Anaphylaxis   Bee Venom Swelling and Rash     ROS: A complete ROS was performed with pertinent positives/negatives noted in the HPI. The remainder of the ROS are negative.    Objective:   Today's Vitals   05/09/23 0933  BP: 118/84  Pulse: 70  Temp: 98.1 F (36.7 C)  TempSrc: Temporal  SpO2: 98%  Weight: 240 lb (108.9 kg)  Height: 5\' 7"  (1.702 m)    GENERAL: Well-appearing, in NAD. Well  nourished.  SKIN: Pink, warm and dry. No rash, lesion, ulceration, or ecchymoses.  HEENT:    HEAD: Normocephalic, non-traumatic.  EYES: Conjunctive pink without exudate. PERRL, EOMI.  EARS: External ear w/o redness, swelling, masses, or lesions. EAC clear. TM's intact, translucent w/o bulging, appropriate landmarks visualized.  NOSE: Septum midline w/o deformity. Nares patent, mucosa pink and non-inflamed w/o drainage. No sinus tenderness.  THROAT: Uvula midline. Oropharynx clear. Tonsils absent. Mucus membranes pink and moist.  NECK: Trachea midline. Full ROM w/o pain or tenderness. No lymphadenopathy.  RESPIRATORY: Chest wall symmetrical. Respirations even and non-labored. Breath sounds clear to auscultation bilaterally.  CARDIAC: S1, S2 present, regular rate and rhythm. Peripheral pulses 2+ bilaterally.  EXTREMITIES: Without clubbing, cyanosis, or edema.  NEUROLOGIC: No motor or sensory deficits. Steady, even gait.  PSYCH/MENTAL STATUS: Alert, oriented x 3. Cooperative, appropriate mood and affect.    No results found for any visits on 05/09/23.    Assessment & Plan:  Assessment and Plan    Chronic Cough:  -Order chest x-ray. -Refer to pulmonology for further evaluation and potential lung function tests. -Continue albuterol inhaler and cough syrup as needed. -Consider starting a daily inhaler pending chest x-ray results.     No orders of the defined types were placed in this encounter.  Lab Orders  No laboratory test(s) ordered today   No images are attached to the encounter or orders placed in the encounter.  Return for Scheduled Routine Office Visits and as needed.   Of note, portions of this note may have been created with voice recognition software [employer]icist, medical). While this note has been edited for accuracy, occasional wrong-word or 'sound-a-like' substitutions may have occurred due to the inherent limitations of voice recognition software.  Salvatore Decent, FNP

## 2023-05-15 DIAGNOSIS — R400 Somnolence: Secondary | ICD-10-CM | POA: Diagnosis not present

## 2023-05-15 DIAGNOSIS — G4733 Obstructive sleep apnea (adult) (pediatric): Secondary | ICD-10-CM | POA: Diagnosis not present

## 2023-05-18 ENCOUNTER — Other Ambulatory Visit: Payer: Self-pay | Admitting: Internal Medicine

## 2023-05-18 DIAGNOSIS — R053 Chronic cough: Secondary | ICD-10-CM

## 2023-05-18 MED ORDER — QVAR REDIHALER 80 MCG/ACT IN AERB
1.0000 | INHALATION_SPRAY | Freq: Two times a day (BID) | RESPIRATORY_TRACT | 1 refills | Status: DC
Start: 2023-05-18 — End: 2023-09-13

## 2023-05-30 ENCOUNTER — Telehealth: Payer: Self-pay | Admitting: Family Medicine

## 2023-05-30 NOTE — Telephone Encounter (Signed)
Patient informed, appointment canceled

## 2023-05-30 NOTE — Telephone Encounter (Signed)
Can I get a 90 day download please? TY!

## 2023-05-30 NOTE — Telephone Encounter (Signed)
Called patient because we couldn't get the data online. Pt said she turned in her cpap machine 2 weeks ago, due to cough. Pt said she had a cough since May. Saw PCP and chest x-ray was ordered to rule out COPD, X-ray was normal, was started on inhaler, cough is better, has used inhaler for about 1 week now.  Pt said she turned in cpap machine because she received a $500 dollar bill because she wasn't using her machine.   Please advise

## 2023-07-18 ENCOUNTER — Ambulatory Visit: Payer: Federal, State, Local not specified - PPO | Admitting: Family Medicine

## 2023-07-24 ENCOUNTER — Encounter: Payer: Self-pay | Admitting: Family Medicine

## 2023-07-24 ENCOUNTER — Ambulatory Visit: Payer: Federal, State, Local not specified - PPO | Admitting: Family Medicine

## 2023-07-24 VITALS — BP 126/84 | HR 85 | Temp 98.2°F | Ht 67.0 in | Wt 237.8 lb

## 2023-07-24 DIAGNOSIS — E782 Mixed hyperlipidemia: Secondary | ICD-10-CM | POA: Diagnosis not present

## 2023-07-24 DIAGNOSIS — J069 Acute upper respiratory infection, unspecified: Secondary | ICD-10-CM

## 2023-07-24 DIAGNOSIS — E063 Autoimmune thyroiditis: Secondary | ICD-10-CM

## 2023-07-24 DIAGNOSIS — F321 Major depressive disorder, single episode, moderate: Secondary | ICD-10-CM | POA: Insufficient documentation

## 2023-07-24 DIAGNOSIS — E119 Type 2 diabetes mellitus without complications: Secondary | ICD-10-CM | POA: Diagnosis not present

## 2023-07-24 DIAGNOSIS — Z7984 Long term (current) use of oral hypoglycemic drugs: Secondary | ICD-10-CM | POA: Diagnosis not present

## 2023-07-24 DIAGNOSIS — E7800 Pure hypercholesterolemia, unspecified: Secondary | ICD-10-CM

## 2023-07-24 DIAGNOSIS — I10 Essential (primary) hypertension: Secondary | ICD-10-CM

## 2023-07-24 LAB — MICROALBUMIN / CREATININE URINE RATIO
Creatinine,U: 220.6 mg/dL
Microalb Creat Ratio: 2.6 mg/g (ref 0.0–30.0)
Microalb, Ur: 5.7 mg/dL — ABNORMAL HIGH (ref 0.0–1.9)

## 2023-07-24 LAB — URINALYSIS, ROUTINE W REFLEX MICROSCOPIC
Bilirubin Urine: NEGATIVE
Hgb urine dipstick: NEGATIVE
Ketones, ur: NEGATIVE
Leukocytes,Ua: NEGATIVE
Nitrite: NEGATIVE
Specific Gravity, Urine: 1.025 (ref 1.000–1.030)
Total Protein, Urine: NEGATIVE
Urine Glucose: NEGATIVE
Urobilinogen, UA: 0.2 (ref 0.0–1.0)
pH: 6 (ref 5.0–8.0)

## 2023-07-24 LAB — BASIC METABOLIC PANEL
BUN: 13 mg/dL (ref 6–23)
CO2: 29 meq/L (ref 19–32)
Calcium: 9.9 mg/dL (ref 8.4–10.5)
Chloride: 103 meq/L (ref 96–112)
Creatinine, Ser: 0.81 mg/dL (ref 0.40–1.20)
GFR: 83.01 mL/min (ref >60.00)
Glucose, Bld: 124 mg/dL — ABNORMAL HIGH (ref 70–99)
Potassium: 4 meq/L (ref 3.5–5.1)
Sodium: 139 meq/L (ref 135–145)

## 2023-07-24 LAB — LIPID PANEL
Cholesterol: 256 mg/dL — ABNORMAL HIGH (ref 0–200)
HDL: 54 mg/dL (ref >39.00)
LDL Cholesterol: 178 mg/dL — ABNORMAL HIGH (ref 0–99)
NonHDL: 202.31
Total CHOL/HDL Ratio: 5
Triglycerides: 124 mg/dL (ref 0.0–149.0)
VLDL: 24.8 mg/dL (ref 0.0–40.0)

## 2023-07-24 LAB — HEMOGLOBIN A1C: Hgb A1c MFr Bld: 7 % — ABNORMAL HIGH (ref 4.6–6.5)

## 2023-07-24 LAB — POC COVID19 BINAXNOW: SARS Coronavirus 2 Ag: NEGATIVE

## 2023-07-24 MED ORDER — FLUOXETINE HCL 20 MG PO CAPS: 20.0000 mg | ORAL_CAPSULE | Freq: Every day | ORAL | 3 refills | Status: DC

## 2023-07-24 NOTE — Assessment & Plan Note (Signed)
Stable. Continue levothyroxine 250 mcg daily.

## 2023-07-24 NOTE — Assessment & Plan Note (Signed)
I will check annual DM labs today. Continue metformin XR 500 mg daily.
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
## 2023-07-24 NOTE — Assessment & Plan Note (Signed)
Discussed home care for viral illness, including rest, pushing fluids, and OTC medications as needed for symptom relief. Recommend hot tea with honey for sore throat symptoms. Follow-up if needed for worsening or persistent symptoms.  

## 2023-07-24 NOTE — Assessment & Plan Note (Signed)
Continue atorvastatin 20 mg daily. 

## 2023-07-24 NOTE — Assessment & Plan Note (Signed)
I recommend we start Ms. Melanie Roberts on an SSRI. I will see her back in 6 weeks. I will also refer her for counseling.

## 2023-07-24 NOTE — Assessment & Plan Note (Signed)
Blood pressure is in good control. Continue amlodipine 10 mg daily.

## 2023-07-25 MED ORDER — ATORVASTATIN CALCIUM 20 MG PO TABS: 20.0000 mg | ORAL_TABLET | Freq: Every day | ORAL | 3 refills | Status: DC

## 2023-07-25 NOTE — Addendum Note (Signed)
Addended by: Loyola Mast on: 07/25/2023 11:36 AM   Modules accepted: Orders

## 2023-07-31 ENCOUNTER — Telehealth: Payer: Self-pay | Admitting: Family Medicine

## 2023-07-31 NOTE — Telephone Encounter (Signed)
07/31/23 - Pt dropped off FMLA form to be filled out by Dr Veto Kemps. Form is in provider's folder at F/O. Pt wants a call back at  [phone] when it is ready for pick up.

## 2023-07-31 NOTE — Telephone Encounter (Signed)
Form placed on providers desk to fill out for depression/anxiety.  Dm/cma

## 2023-08-01 ENCOUNTER — Telehealth: Payer: Self-pay | Admitting: Family Medicine

## 2023-08-01 DIAGNOSIS — Z0279 Encounter for issue of other medical certificate: Secondary | ICD-10-CM

## 2023-08-01 NOTE — Telephone Encounter (Signed)
Patient states that its the Metformin that is causing the diarrhea issues and the fluid pill causes the increased urination.  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   Please review and advise.  Thanks. Dm/cma

## 2023-08-01 NOTE — Telephone Encounter (Signed)
Pt is asking for a letter for her job stating that she will have frequent urination and IBS due to her new meds. She has to leave her work site to find a restroom. She would like hard copy she can pick up. Please call when ready.   Also her cough doesn't seem to be getting better.

## 2023-08-01 NOTE — Telephone Encounter (Signed)
Patient notified and letter place up front for pick up. Dm/cma

## 2023-08-02 NOTE — Telephone Encounter (Signed)
Form filled out and placed up front for pick up.  Copy given to [facility] for billing.  Patient notified VIA phone. Dm/cma

## 2023-08-06 ENCOUNTER — Emergency Department (HOSPITAL_BASED_OUTPATIENT_CLINIC_OR_DEPARTMENT_OTHER): Payer: Federal, State, Local not specified - PPO | Admitting: Radiology

## 2023-08-06 ENCOUNTER — Emergency Department (HOSPITAL_BASED_OUTPATIENT_CLINIC_OR_DEPARTMENT_OTHER)
Admission: EM | Admit: 2023-08-06 | Discharge: 2023-08-06 | Disposition: A | Discharge status: Home / Self Care | Payer: Federal, State, Local not specified - PPO | Attending: Emergency Medicine | Admitting: Emergency Medicine

## 2023-08-06 ENCOUNTER — Other Ambulatory Visit: Payer: Self-pay

## 2023-08-06 DIAGNOSIS — Z87442 Personal history of urinary calculi: Secondary | ICD-10-CM | POA: Insufficient documentation

## 2023-08-06 DIAGNOSIS — E039 Hypothyroidism, unspecified: Secondary | ICD-10-CM | POA: Diagnosis not present

## 2023-08-06 DIAGNOSIS — E119 Type 2 diabetes mellitus without complications: Secondary | ICD-10-CM | POA: Diagnosis not present

## 2023-08-06 DIAGNOSIS — R059 Cough, unspecified: Secondary | ICD-10-CM | POA: Diagnosis not present

## 2023-08-06 DIAGNOSIS — I10 Essential (primary) hypertension: Secondary | ICD-10-CM | POA: Diagnosis not present

## 2023-08-06 DIAGNOSIS — R0781 Pleurodynia: Secondary | ICD-10-CM | POA: Diagnosis not present

## 2023-08-06 DIAGNOSIS — Z79899 Other long term (current) drug therapy: Secondary | ICD-10-CM | POA: Insufficient documentation

## 2023-08-06 LAB — CBC
HCT: 37.4 % (ref 36.0–46.0)
Hemoglobin: 12.2 g/dL (ref 12.0–15.0)
MCH: 28.8 pg (ref 26.0–34.0)
MCHC: 32.6 g/dL (ref 30.0–36.0)
MCV: 88.4 fL (ref 80.0–100.0)
Platelets: 332 10*3/uL (ref 150–400)
RBC: 4.23 MIL/uL (ref 3.87–5.11)
RDW: 14.9 % (ref 11.5–15.5)
WBC: 5.9 10*3/uL (ref 4.0–10.5)
nRBC: 0 % (ref 0.0–0.2)

## 2023-08-06 LAB — URINALYSIS, ROUTINE W REFLEX MICROSCOPIC
Bilirubin Urine: NEGATIVE
Glucose, UA: NEGATIVE mg/dL
Hgb urine dipstick: NEGATIVE
Ketones, ur: NEGATIVE mg/dL
Leukocytes,Ua: NEGATIVE
Nitrite: NEGATIVE
Specific Gravity, Urine: 1.026 (ref 1.005–1.030)
pH: 6 (ref 5.0–8.0)

## 2023-08-06 LAB — BASIC METABOLIC PANEL
Anion gap: 8 (ref 5–15)
BUN: 15 mg/dL (ref 6–20)
CO2: 26 mmol/L (ref 22–32)
Calcium: 10.1 mg/dL (ref 8.9–10.3)
Chloride: 105 mmol/L (ref 98–111)
Creatinine, Ser: 0.75 mg/dL (ref 0.44–1.00)
GFR, Estimated: >60 mL/min (ref >60)
Glucose, Bld: 120 mg/dL — ABNORMAL HIGH (ref 70–99)
Potassium: 3.8 mmol/L (ref 3.5–5.1)
Sodium: 139 mmol/L (ref 135–145)

## 2023-08-06 LAB — CBG MONITORING, ED: Glucose-Capillary: 129 mg/dL — ABNORMAL HIGH (ref 70–99)

## 2023-08-06 MED ORDER — ACETAMINOPHEN 325 MG PO TABS
650.0000 mg | ORAL_TABLET | Freq: Once | ORAL | Status: AC
  Administered 2023-08-06: 650 mg via ORAL
  Filled 2023-08-06: qty 2

## 2023-08-06 MED ORDER — LIDOCAINE 5 % EX PTCH: 1.0000 | MEDICATED_PATCH | CUTANEOUS | 0 refills | Status: DC

## 2023-08-06 MED ORDER — CYCLOBENZAPRINE HCL 10 MG PO TABS: 10.0000 mg | ORAL_TABLET | Freq: Two times a day (BID) | ORAL | 0 refills | Status: AC | PRN

## 2023-08-06 MED ORDER — LIDOCAINE 5 % EX PTCH
1.0000 | MEDICATED_PATCH | CUTANEOUS | Status: DC
  Administered 2023-08-06: 1 via TRANSDERMAL
  Filled 2023-08-06: qty 1

## 2023-08-06 NOTE — Discharge Instructions (Addendum)
Please follow-up with your primary care provider in regards to recent ER visit.  Today your labs were reassuring and your physical exam shows that you may have some musculoskeletal tenderness in your ribs causing your pain.  As we discussed we agreed to forego CT scan at this time and agreed to discharge and try outpatient therapy.  Please use the lidocaine patches I prescribed for you and use Tylenol every 6 hours needed for pain.  You may use ice for pain.  I have also prescribed you Flexeril.  This is a muscle relaxer.  Please do not take this medicine operate machinery or drive as it will make you drowsy.  I have also attached a work note for you.  If symptoms change or worsen please return to ER.

## 2023-08-06 NOTE — ED Triage Notes (Signed)
Right flank pain since yesterday. Non-radiating. No urinary changes- 'I go a lot because of my diabetes.' Also complains of dry cough for several weeks now.- has been using inhaler, not diagnosed with asthma. +N/-V/-D. Denies CP SOB. No swelling in hands or feet, reports pins and needles in hands x3 days.  See chart for meds- up to date- no recent changes, reports compliant.

## 2023-08-06 NOTE — ED Provider Notes (Signed)
[facility] EM[facility]ENC[facility] DEPARTMENT AT [facility] Provider Note   CSN: [id number] Arrival date & time: 08/06/23  1824     History  Chief Complaint  Patient presents with   Flank Pain    Right    Melanie Roberts is a 52 y.o. female history of hypothyroidism, hypertension, UTI, GERD, diabetes presented for right rib pain since yesterday.  Patient that she is getting over a URI and has been coughing a lot recently and thinks that she may have pulled a muscle.  Patient denies abdominal pain.  Patient has every time that she coughs her right rib hurts.  Patient denies any trauma or skin color changes or rashes.  Patient denies shortness of breath, chest pain, nausea/vomiting.  Triage note does state the patient states that she has had nausea however patient denies this with me.  Patient denies diarrhea as well, fevers.  Patient states she still has her gallbladder but denies any abdominal pain.  Patient has history of kidney stones and states the pain does not radiate down to her groin it denies any dysuria or hematuria.   Home Medications Prior to Admission medications   Medication Sig Start Date End Date Taking? Authorizing Provider  cyclobenzaprine (FLEXERIL) 10 MG tablet Take 1 tablet (10 mg total) by mouth 2 (two) times daily as needed for muscle spasms. 08/06/23  Yes Kadon Andrus, Fayrene Fearing T, PA-C  lidocaine (LIDODERM) 5 % Place 1 patch onto the skin daily. Remove & Discard patch within 12 hours or as directed by MD 08/06/23  Yes Costas Sena, Beverly Gust, PA-C  albuterol (VENTOLIN HFA) 108 (90 Base) MCG/ACT inhaler Inhale 1-2 puffs into the lungs every 6 (six) hours as needed for wheezing or shortness of breath. 12/02/22   Gustavus Bryant, FNP  amLODipine (NORVASC) 10 MG tablet TAKE 1 TABLET(10 MG) BY MOUTH DAILY 10/20/22   Loyola Mast, MD  atorvastatin (LIPITOR) 20 MG tablet Take 1 tablet (20 mg total) by mouth daily. 07/25/23   Loyola Mast, MD  beclomethasone (QVAR REDIHALER) 80  MCG/ACT inhaler Inhale 1 puff into the lungs 2 (two) times daily. 05/18/23   Salvatore Decent, FNP  cetirizine (ZYRTEC) 10 MG tablet Take 1 tablet (10 mg total) by mouth daily. 06/29/21   Eulis Foster, FNP  EPINEPHrine (EPIPEN 2-PAK) 0.3 mg/0.3 mL IJ SOAJ injection Inject 0.3 mLs (0.3 mg total) into the muscle as needed (allergic reaction). 05/15/16   Whiteheart, Mellody Life, NP  ergocalciferol (VITAMIN D2) 1.25 MG (50000 UT) capsule Take 1 capsule by mouth every 7 (seven) days. 08/27/20   [provider]  estradiol (ESTRACE) 0.5 MG tablet TAKE 1 TABLET(0.5 MG) BY MOUTH DAILY 02/19/23   Loyola Mast, MD  famotidine (PEPCID) 20 MG tablet Take 1 tablet (20 mg total) by mouth 2 (two) times daily. 03/02/23   Carlisle Beers, FNP  FLUoxetine (PROZAC) 20 MG capsule Take 1 capsule (20 mg total) by mouth daily. 07/24/23   Loyola Mast, MD  fluticasone [facility]) 50 MCG/ACT nasal spray SHAKE LIQUID AND USE 2 SPRAYS IN [facility] NOSTRIL DAILY 04/22/20   Cirigliano, Mary K, DO  furosemide (LASIX) 20 MG tablet TAKE 1 TABLET BY MOUTH DAILY AS NEEDED 10/19/20   Overton Mam, DO  ibuprofen (ADVIL) 600 MG tablet  11/25/20   [provider]  Lancets (ONETOUCH DELICA PLUS LANCET30G) MISC daily. as directed 08/03/21   [provider]  Levothyroxine Sodium (TIROSINT) 50 MCG CAPS Take 50 mcg by mouth daily  before breakfast.    [provider]  metFORMIN (GLUCOPHAGE-XR) 500 MG 24 hr tablet Take by mouth. 08/16/21   [provider]  naproxen (NAPROSYN) 375 MG tablet Take 1 tablet (375 mg total) by mouth 2 (two) times daily. 07/29/19   Palumbo, April, MD  ondansetron (ZOFRAN) 4 MG tablet Take 1 tablet (4 mg total) by mouth every 8 (eight) hours as needed for nausea or vomiting. 11/06/19   Cristie Hem, PA-C  ONETOUCH VERIO test strip daily. 08/03/21   [provider]  pantoprazole (PROTONIX) 40 MG tablet Take 1 tablet (40 mg total) by mouth 2 (two) times daily. 05/19/22    Cirigliano, Vito V, DO  TIROSINT 200 MCG CAPS Take by mouth. 08/16/21   [provider]      Allergies    Ciprofloxacin, Quinolones, and Bee venom    Review of Systems   Review of Systems  Physical Exam Updated Vital Signs BP 121/79   Pulse 87   Temp 98.2 F (36.8 C)   Resp 18   SpO2 96%  Physical Exam Vitals reviewed.  Constitutional:      General: She is not in acute distress. HENT:     Head: Normocephalic and atraumatic.  Eyes:     Extraocular Movements: Extraocular movements intact.     Conjunctiva/sclera: Conjunctivae normal.     Pupils: Pupils are equal, round, and reactive to light.  Cardiovascular:     Rate and Rhythm: Normal rate and regular rhythm.     Pulses: Normal pulses.     Heart sounds: Normal heart sounds.     Comments: 2+ bilateral radial/dorsalis pedis pulses with regular rate Pulmonary:     Effort: Pulmonary effort is normal. No respiratory distress.     Breath sounds: Normal breath sounds.  Abdominal:     Palpations: Abdomen is soft.     Tenderness: There is no abdominal tenderness. There is no right CVA tenderness, left CVA tenderness, guarding or rebound.  Musculoskeletal:        General: Normal range of motion.     Cervical back: Normal range of motion and neck supple.     Comments: 5 out of 5 bilateral grip/leg extension strength Tenderness to palpation in right lower ribs when palpating the bone, no bony abnormalities noted  Skin:    General: Skin is warm and dry.     Capillary Refill: Capillary refill takes less than 2 seconds.     Findings: No rash.  Neurological:     General: No focal deficit present.     Mental Status: She is alert and oriented to person, place, and time.     Comments: Sensation intact in all 4 limbs  Psychiatric:        Mood and Affect: Mood normal.     ED Results / Procedures / Treatments   Labs (all labs ordered are listed, but only abnormal results are displayed) Labs Reviewed  URINALYSIS, ROUTINE  W REFLEX MICROSCOPIC - Abnormal; Notable for the following components:      Result Value   Protein, ur TRACE (*)    All other components within normal limits  BASIC METABOLIC PANEL - Abnormal; Notable for the following components:   Glucose, Bld 120 (*)    All other components within normal limits  CBG MONITORING, ED - Abnormal; Notable for the following components:   Glucose-Capillary 129 (*)    All other components within normal limits  CBC  CBG MONITORING, ED  EKG None  Radiology DG Chest 2 View  Result Date: 08/06/2023 CLINICAL DATA:  Cough for several weeks. EXAM: CHEST - 2 VIEW COMPARISON:  May 09, 2023. FINDINGS: The heart size and mediastinal contours are within normal limits. Both lungs are clear. The visualized skeletal structures are unremarkable. IMPRESSION: No active cardiopulmonary disease. Electronically Signed   By: Lupita Raider M.D.   On: 08/06/2023 21:28    Procedures Procedures    Medications Ordered in ED Medications  lidocaine (LIDODERM) 5 % 1 patch (1 patch Transdermal Patch Applied 08/06/23 2214)  acetaminophen (TYLENOL) tablet 650 mg (650 mg Oral Given 08/06/23 2214)    ED Course/ Medical Decision Making/ A&P                                 Medical Decision Making Amount and/or Complexity of Data Reviewed Labs: ordered. Radiology: ordered.  Risk OTC drugs. Prescription drug management.   Daiva Nakayama 52 y.o. presented today for flank pain. Working DDx that I considered at this time includes, but not limited to, MSK, nephrolithiasis, pyelonephritis, AAA, aortic dissection, mesenteric ischemia, RCC, obstructive uropathy, renal infarct/hemorrhage, tumor, biliary colic, pancreatitis, appendicitis, SBO, diverticulitis, shingles, lower lobe pneumonia, ectopic pregnancy, PID/TOA, ovarian torsion, endometriosis.  R/o DDx: nephrolithiasis, pyelonephritis, AAA, aortic dissection, mesenteric ischemia, RCC, obstructive uropathy, renal  infarct/hemorrhage, tumor, biliary colic, pancreatitis, appendicitis, SBO, diverticulitis, shingles, lower lobe pneumonia, ectopic pregnancy, PID/TOA, ovarian torsion, endometriosis: These are considered less likely due to history of present illness, physical exam, lab/imaging findings  Review of prior external notes: 04/05/2023 ED  Unique Tests and My Interpretation:  CBC: Unremarkable QIO:[id number] Lipase:Unremarkable MW:[id number] CBG: Unremarkable  Discussion with Independent Historian: None  Discussion of Management of Tests: None  Risk: Medium: prescription drug management  Risk Stratification Score: None  Plan: On exam patient was in no acute distress stable vitals.  On exam patient did have tenderness to her right lower ribs without bony abnormalities and the rest of her exam was unremarkable including CVA tenderness, abdominal tenderness.  Patient's labs from triage were also reassuring.  Patient states it only hurts when I press on her right ribs but denies any trauma but does note that she is getting over a cough and has been coughing a lot and is concerned she may have strained a muscle.  Chest x-ray is negative for any fractures.  I spoke to the patient we had shared decision making agreed to forego any imaging as patient believes this is most likely muscle skeletal in we agreed that a CT scan would not be beneficial at this time.  Will discharge on lidocaine patches along with Flexeril.  Encouraged patient to use Flexeril before going to bed do not operate machinery or drive as it is very sedating medicine.  Patient was also given work note at her request.  Encouraged patient to follow-up with primary care provider andto be reevaluated and to return to ER symptoms are change or worsen.  Patient was given return precautions. Patient stable for discharge at this time.  Patient verbalized understanding of plan.  This chart was dictated using voice recognition software.   Despite best efforts to proofread,  errors can occur which can change the documentation meaning.         Final Clinical Impression(s) / ED Diagnoses Final diagnoses:  Rib pain    Rx / DC Orders ED Discharge Orders  Ordered    lidocaine (LIDODERM) 5 %  Every 24 hours        08/06/23 2245    cyclobenzaprine (FLEXERIL) 10 MG tablet  2 times daily PRN        08/06/23 2245              Remi Deter 08/06/23 2249    Benjiman Core, MD 08/06/23 2320

## 2023-08-17 ENCOUNTER — Telehealth: Payer: Self-pay

## 2023-08-17 NOTE — Telephone Encounter (Signed)
FMLA form amended and patient notified that it is ready for pick up. Dm/cma

## 2023-08-20 DIAGNOSIS — E039 Hypothyroidism, unspecified: Secondary | ICD-10-CM | POA: Diagnosis not present

## 2023-08-22 DIAGNOSIS — E1165 Type 2 diabetes mellitus with hyperglycemia: Secondary | ICD-10-CM | POA: Diagnosis not present

## 2023-08-22 DIAGNOSIS — E063 Autoimmune thyroiditis: Secondary | ICD-10-CM | POA: Diagnosis not present

## 2023-08-22 DIAGNOSIS — E041 Nontoxic single thyroid nodule: Secondary | ICD-10-CM | POA: Diagnosis not present

## 2023-08-27 ENCOUNTER — Ambulatory Visit: Payer: Federal, State, Local not specified - PPO | Admitting: Psychology

## 2023-09-04 ENCOUNTER — Ambulatory Visit: Payer: Federal, State, Local not specified - PPO | Admitting: Family Medicine

## 2023-09-04 ENCOUNTER — Encounter: Payer: Self-pay | Admitting: Family Medicine

## 2023-09-04 VITALS — BP 132/84 | HR 78 | Temp 97.5°F | Ht 67.0 in | Wt 233.6 lb

## 2023-09-04 DIAGNOSIS — E119 Type 2 diabetes mellitus without complications: Secondary | ICD-10-CM | POA: Diagnosis not present

## 2023-09-04 DIAGNOSIS — Z7985 Long-term (current) use of injectable non-insulin antidiabetic drugs: Secondary | ICD-10-CM | POA: Diagnosis not present

## 2023-09-04 DIAGNOSIS — R058 Other specified cough: Secondary | ICD-10-CM

## 2023-09-04 DIAGNOSIS — Z7984 Long term (current) use of oral hypoglycemic drugs: Secondary | ICD-10-CM | POA: Diagnosis not present

## 2023-09-04 MED ORDER — HYDROCODONE BIT-HOMATROP MBR 5-1.5 MG/5ML PO SOLN
5.0000 mL | Freq: Three times a day (TID) | ORAL | 0 refills | Status: DC | PRN
Start: 2023-09-04 — End: 2024-01-02

## 2023-09-04 NOTE — Assessment & Plan Note (Signed)
A1c is at goal. Continue metformin XR 500 mg daily and semaglutide (Ozempic) 0.25 mg weekly under the direction of endocrinology.

## 2023-09-04 NOTE — Progress Notes (Signed)
[facility] [address] [place] [address] Dept: [phone] Dept Fax: [phone]  Office Visit  Subjective:    Patient ID: Melanie Roberts, female    DOB: 11/26/1970, 52 y.o..   MRN: [id number]  Chief Complaint  Patient presents with   Depression    6 week f/u.  C/o having dry cough that is lingering.    History of Present Illness:  Patient is in today complaining of a persistent cough. I had seen her about 1 month ago with a viral URI. She notes most of her symptoms have resolved except for episodic cough. She has been using Mucinex which may be helping a small amount. She did try Tessalon, but this did not seem to help much.  Melanie Roberts has a history of Type 2 diabetes. She is managed on metformin XR 500 mg daily. Her recent A1c was 6.5%. Her endocrinologist has started her on semaglutide (Ozempic) 0.25 mg weekly. She is tolerating this well.   Past Medical History: Patient Active Problem List   Diagnosis Date Noted   Depression, major, single episode, moderate (HCC) 07/24/2023   Viral URI 07/24/2023   Macromastia 03/17/2022   Thyroid nodule 09/27/2021   Gastroesophageal reflux disease 09/27/2021   Low back pain 09/27/2021   Pedal edema 09/27/2021   Plantar fasciitis 09/27/2021   Essential hypertension 09/27/2021   Type 2 diabetes mellitus (HCC) 04/06/2021   Vitamin D deficiency 10/05/2020   Pain in right elbow 11/13/2019   Impingement syndrome of right shoulder 10/29/2019   Nontraumatic incomplete tear of right rotator cuff 10/29/2019   Gastric nodule    Hypothyroidism due to Hashimoto's thyroiditis 01/26/2017   Hyperlipidemia 01/26/2017   Bee sting allergy 05/14/2016   Idiopathic urticaria 09/29/2015   Past Surgical History:  Procedure Laterality Date   ADENOIDECTOMY  02/17/2005   CESAREAN SECTION  07/08/2003   ESOPHAGOGASTRODUODENOSCOPY (EGD) WITH PROPOFOL N/A 06/19/2019   Procedure:  ESOPHAGOGASTRODUODENOSCOPY (EGD) WITH PROPOFOL;  Surgeon: Rachael Fee, MD;  Location: [facility];  Service: Gastroenterology;  Laterality: N/A;   EUS N/A 06/19/2019   Procedure: UPPER ENDOSCOPIC ULTRASOUND (EUS) RADIAL;  Surgeon: Rachael Fee, MD;  Location: [facility];  Service: Gastroenterology;  Laterality: N/A;   EVALUATION UNDER ANESTHESIA WITH HEMORRHOIDECTOMY N/A 01/14/2013   Procedure: EXAM UNDER ANESTHESIA WITH HEMORRHOIDECTOMY POSSIBLE BANDING;  Surgeon: Lodema Pilot, DO;  Location: [facility];  Service: General;  Laterality: N/A;   fibroids removed     LAPAROSCOPIC TOTAL HYSTERECTOMY  10/29/2006   MYOMECTOMY  12/18/2001   x 4   NASAL TURBINATE REDUCTION Bilateral 02/07/2005   inferior and removed adenoids   ROTATOR CUFF REPAIR Right 2020   TONSILLECTOMY AND ADENOIDECTOMY  age 55   TUBAL LIGATION  07/08/2003   Family History  Problem Relation Age of Onset   Diabetes Mother    Fibroids Mother        uterine    Dementia Mother    Pancreatic cancer Father    Cancer Maternal Aunt        Breast   Breast cancer Maternal Aunt        late 87s   Cancer Maternal Aunt        Liver   Cancer Maternal Aunt        Throat   Heart disease Maternal Uncle        110s   Colon cancer Paternal Uncle    Clotting disorder Maternal Grandmother  Diabetes Maternal Grandmother    Liver cancer Maternal Grandfather    Diabetes Maternal Grandfather    Pancreatic cancer Paternal Grandmother    Diabetes Paternal Grandmother    Stomach cancer Paternal Grandfather    Diabetes Paternal Grandfather    Breast cancer Cousin        early 36s   Esophageal cancer Neg Hx    Sleep apnea Neg Hx    Outpatient Medications Prior to Visit  Medication Sig Dispense Refill   albuterol (VENTOLIN HFA) 108 (90 Base) MCG/ACT inhaler Inhale 1-2 puffs into the lungs every 6 (six) hours as needed for wheezing or shortness of breath. 1 each 0   amLODipine (NORVASC) 10 MG tablet TAKE 1  TABLET(10 MG) BY MOUTH DAILY 90 tablet 3   atorvastatin (LIPITOR) 20 MG tablet Take 1 tablet (20 mg total) by mouth daily. 90 tablet 3   beclomethasone (QVAR REDIHALER) 80 MCG/ACT inhaler Inhale 1 puff into the lungs 2 (two) times daily. 1 each 1   cetirizine (ZYRTEC) 10 MG tablet Take 1 tablet (10 mg total) by mouth daily. 90 tablet 0   cyclobenzaprine (FLEXERIL) 10 MG tablet Take 1 tablet (10 mg total) by mouth 2 (two) times daily as needed for muscle spasms. 20 tablet 0   EPINEPHrine (EPIPEN 2-PAK) 0.3 mg/0.3 mL IJ SOAJ injection Inject 0.3 mLs (0.3 mg total) into the muscle as needed (allergic reaction). 1 Device 1   ergocalciferol (VITAMIN D2) 1.25 MG (50000 UT) capsule Take 1 capsule by mouth every 7 (seven) days.     estradiol (ESTRACE) 0.5 MG tablet TAKE 1 TABLET(0.5 MG) BY MOUTH DAILY 90 tablet 3   famotidine (PEPCID) 20 MG tablet Take 1 tablet (20 mg total) by mouth 2 (two) times daily. 30 tablet 0   FLUoxetine (PROZAC) 20 MG capsule Take 1 capsule (20 mg total) by mouth daily. 90 capsule 3   fluticasone (FLONASE) 50 MCG/ACT nasal spray SHAKE LIQUID AND USE 2 SPRAYS IN EACH NOSTRIL DAILY 16 g 2   furosemide (LASIX) 20 MG tablet TAKE 1 TABLET BY MOUTH DAILY AS NEEDED 90 tablet 2   ibuprofen (ADVIL) 600 MG tablet      Lancets (ONETOUCH DELICA PLUS LANCET30G) MISC daily. as directed     Levothyroxine Sodium (TIROSINT) 200 MCG CAPS Take 200 mg by mouth daily.     lidocaine (LIDODERM) 5 % Place 1 patch onto the skin daily. Remove & Discard patch within 12 hours or as directed by MD 30 patch 0   metFORMIN (GLUCOPHAGE-XR) 500 MG 24 hr tablet Take by mouth.     naproxen (NAPROSYN) 375 MG tablet Take 1 tablet (375 mg total) by mouth 2 (two) times daily. 20 tablet 0   ondansetron (ZOFRAN) 4 MG tablet Take 1 tablet (4 mg total) by mouth every 8 (eight) hours as needed for nausea or vomiting. 40 tablet 0   ONETOUCH VERIO test strip daily.     OZEMPIC, 0.25 OR 0.5 MG/DOSE, 2 MG/3ML SOPN Inject 0.25  mg into the skin once a week.     pantoprazole (PROTONIX) 40 MG tablet Take 1 tablet (40 mg total) by mouth 2 (two) times daily. 60 tablet 3   TIROSINT 75 MCG CAPS Take 1 capsule by mouth daily.     Levothyroxine Sodium (TIROSINT) 50 MCG CAPS Take 50 mcg by mouth daily before breakfast.     TIROSINT 200 MCG CAPS Take by mouth.     No facility-administered medications prior to visit.  Allergies  Allergen Reactions   Ciprofloxacin Anaphylaxis   Quinolones Anaphylaxis   Bee Venom Swelling and Rash     Objective:   Today's Vitals   09/04/23 0957  BP: 132/84  Pulse: 78  Temp: (!) 97.5 F (36.4 C)  TempSrc: Temporal  SpO2: 96%  Weight: 233 lb 9.6 oz (106 kg)  Height: 5\' 7"  (1.702 m)   Body mass index is 36.59 kg/m.   General: Well developed, well nourished. No acute distress. HEENT: Normocephalic, non-traumatic. Conjunctiva clear. External ears normal. EAC and TMs normal bilaterally. Nose    clear without congestion or rhinorrhea. Mucous membranes moist. Oropharynx clear. Good dentition. Neck: Supple. No lymphadenopathy. No thyromegaly. Lungs: Clear to auscultation bilaterally. No wheezing, rales or rhonchi. CV: RRR without murmurs or rubs. Pulses 2+ bilaterally. Feet- Skin intact. No sign of maceration between toes. Great nails are thickened. Dorsalis pedis and posterior tibial artery pulses are   normal. There is an area of callus over the sole at the MTP joints on the left. 5.07 monofilament testing normal, except for the area of   callus on the left. Psych: Alert and oriented. Normal mood and affect.  Health Maintenance Due  Topic Date Due   OPHTHALMOLOGY EXAM  02/23/2021     Assessment & Plan:   Problem List Items Addressed This Visit       Endocrine   Type 2 diabetes mellitus (HCC)    A1c is at goal. Continue metformin XR 500 mg daily and semaglutide (Ozempic) 0.25 mg weekly under the direction of endocrinology.      Relevant Medications   OZEMPIC, 0.25 OR 0.5  MG/DOSE, 2 MG/3ML SOPN   Other Visit Diagnoses     Post-viral cough syndrome    -  Primary   I will prescribe some cough syrup for symptom management. I recommend she give this more time to resolve. I see no indication for antibiotics.   Relevant Medications   HYDROcodone bit-homatropine (HYCODAN) 5-1.5 MG/5ML syrup       Return in about 3 months (around 12/05/2023) for Reassessment.   Loyola Mast, MD

## 2023-09-13 ENCOUNTER — Other Ambulatory Visit: Payer: Self-pay | Admitting: Internal Medicine

## 2023-09-13 DIAGNOSIS — R053 Chronic cough: Secondary | ICD-10-CM

## 2023-10-02 ENCOUNTER — Ambulatory Visit: Payer: Federal, State, Local not specified - PPO | Admitting: Psychology

## 2023-10-02 DIAGNOSIS — F321 Major depressive disorder, single episode, moderate: Secondary | ICD-10-CM
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
## 2023-11-08 ENCOUNTER — Ambulatory Visit: Payer: Federal, State, Local not specified - PPO | Admitting: Psychology

## 2023-11-13 ENCOUNTER — Other Ambulatory Visit: Payer: Self-pay | Admitting: Family Medicine

## 2023-11-13 DIAGNOSIS — I10 Essential (primary) hypertension: Secondary | ICD-10-CM

## 2023-11-16 ENCOUNTER — Ambulatory Visit: Payer: Federal, State, Local not specified - PPO | Admitting: Psychology

## 2023-11-23 ENCOUNTER — Ambulatory Visit: Payer: Federal, State, Local not specified - PPO | Admitting: Psychology

## 2023-12-05 ENCOUNTER — Ambulatory Visit: Payer: Federal, State, Local not specified - PPO | Admitting: Family Medicine

## 2023-12-11 ENCOUNTER — Ambulatory Visit: Payer: Federal, State, Local not specified - PPO | Admitting: Family Medicine

## 2023-12-11 ENCOUNTER — Encounter: Payer: Self-pay | Admitting: Family Medicine

## 2023-12-11 VITALS — BP 138/82 | HR 72 | Temp 97.9°F | Ht 67.0 in | Wt 223.2 lb

## 2023-12-11 DIAGNOSIS — E782 Mixed hyperlipidemia: Secondary | ICD-10-CM

## 2023-12-11 DIAGNOSIS — E119 Type 2 diabetes mellitus without complications: Secondary | ICD-10-CM

## 2023-12-11 DIAGNOSIS — Z1231 Encounter for screening mammogram for malignant neoplasm of breast: Secondary | ICD-10-CM

## 2023-12-11 DIAGNOSIS — G5601 Carpal tunnel syndrome, right upper limb: Secondary | ICD-10-CM

## 2023-12-11 DIAGNOSIS — I10 Essential (primary) hypertension: Secondary | ICD-10-CM | POA: Diagnosis not present

## 2023-12-11 DIAGNOSIS — Z7984 Long term (current) use of oral hypoglycemic drugs: Secondary | ICD-10-CM

## 2023-12-11 DIAGNOSIS — Z7985 Long-term (current) use of injectable non-insulin antidiabetic drugs: Secondary | ICD-10-CM

## 2023-12-11 DIAGNOSIS — E063 Autoimmune thyroiditis: Secondary | ICD-10-CM

## 2023-12-11 LAB — T4, FREE: Free T4: 0.58 ng/dL — ABNORMAL LOW (ref 0.60–1.60)

## 2023-12-11 LAB — GLUCOSE, RANDOM: Glucose, Bld: 96 mg/dL (ref 70–99)

## 2023-12-11 LAB — TSH: TSH: 37.47 u[IU]/mL — ABNORMAL HIGH (ref 0.35–5.50)

## 2023-12-11 LAB — HEMOGLOBIN A1C: Hgb A1c MFr Bld: 6.4 % (ref 4.6–6.5)

## 2023-12-11 MED ORDER — AMLODIPINE-OLMESARTAN 10-20 MG PO TABS
1.0000 | ORAL_TABLET | Freq: Every day | ORAL | 3 refills | Status: AC
Start: 2023-12-11 — End: ?

## 2023-12-11 NOTE — Assessment & Plan Note (Signed)
I will recheck her TSH today. Continue levothyroxine (Tirosint) 275 mcg daily.

## 2023-12-11 NOTE — Assessment & Plan Note (Addendum)
 Continue atorvastatin 20mg  daily

## 2023-12-11 NOTE — Assessment & Plan Note (Signed)
Blood pressure has been averaging above goal. I will switch her to amlodipine-olmesartan 10-20 mg daily.

## 2023-12-11 NOTE — Assessment & Plan Note (Signed)
Symptoms are consistent with carpal tunnel syndrome. Ms. Melanie Roberts plans to seek FMLA. I will refer her to a hand specialist for evaluation and consideration of surgery.

## 2023-12-11 NOTE — Assessment & Plan Note (Addendum)
A1c is at goal. Continue metformin XR 500 mg daily and semaglutide (Ozempic) 0.5 mg weekly under the direction of endocrinology. I will reassess her A1c today.

## 2023-12-11 NOTE — Addendum Note (Signed)
Addended by: Loyola Mast on: 12/11/2023 04:12 PM   Modules accepted: Orders

## 2023-12-11 NOTE — Progress Notes (Signed)
[facility] [address] [place] [address] Dept: [phone] Dept Fax: [phone]  Chronic Care Office Visit  Subjective:    Patient ID: Melanie Roberts, female    DOB: 05/04/1971, 53 y.o..   MRN: [id number]  Chief Complaint  Patient presents with   Follow-up    3 month f/u.  C/o having numbness in Rt fingertips and LT foot.     History of Present Illness:  Patient is in today for reassessment of chronic medical issues.  Melanie Roberts has a history of Type 2 diabetes. She is managed on metformin XR 500 mg daily semaglutide (Ozempic) 0.5 mg weekly. She is tolerating this well.    Melanie Roberts has a history of hypothyroidism. She is managed by Ms. [facility] (endocrinology NP) and is currently on Tirosint (levothyroxine) 275 mcg daily.  This was increased in Oct.  Melanie Roberts has a history of  hypertension and is managed with amlodipine 10 mg daily.   Melanie Roberts has hyperlipidemia. She is managed on atorvastatin 20 mg daily.   Melanie Roberts notes an issue with numbness in her fingertips, esp. the right 3rd and 4th finger. 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 She has had issues that the numbness decreases her accuracy and causes her to have to slow down at times. Some days, she needs a break to do other activities. At other times, she knows this is stirred up even prior or arriving at work, so misses days. She has been using a glove designed to relive CTS, but this is only partially effective. She has tried wrist splints, but she can't do the keying with the splint in place.  Past Medical History: Patient Active Problem List   Diagnosis Date Noted   Carpal tunnel syndrome of right wrist 12/11/2023   Depression, major, single episode, moderate (HCC) 07/24/2023   Viral URI  07/24/2023   Macromastia 03/17/2022   Thyroid nodule 09/27/2021   Gastroesophageal reflux disease 09/27/2021   Low back pain 09/27/2021   Pedal edema 09/27/2021   Plantar fasciitis 09/27/2021   Essential hypertension 09/27/2021   Type 2 diabetes mellitus (HCC) 04/06/2021   Vitamin D deficiency 10/05/2020   Pain in right elbow 11/13/2019   Impingement syndrome of right shoulder 10/29/2019   Nontraumatic incomplete tear of right rotator cuff 10/29/2019   Gastric nodule    Hypothyroidism due to Hashimoto's thyroiditis 01/26/2017   Hyperlipidemia 01/26/2017   Bee sting allergy 05/14/2016   Idiopathic urticaria 09/29/2015   Past Surgical History:  Procedure Laterality Date   ADENOIDECTOMY  02/17/2005   CESAREAN SECTION  07/08/2003   ESOPHAGOGASTRODUODENOSCOPY (EGD) WITH PROPOFOL N/A 06/19/2019   Procedure: ESOPHAGOGASTRODUODENOSCOPY (EGD) WITH PROPOFOL;  Surgeon: Rachael Fee, MD;  Location: [facility];  Service: Gastroenterology;  Laterality: N/A;   EUS N/A 06/19/2019   Procedure: UPPER ENDOSCOPIC ULTRASOUND (EUS) RADIAL;  Surgeon: Rachael Fee, MD;  Location: [facility];  Service: Gastroenterology;  Laterality: N/A;   EVALUATION UNDER ANESTHESIA WITH HEMORRHOIDECTOMY N/A 01/14/2013   Procedure: EXAM UNDER ANESTHESIA WITH HEMORRHOIDECTOMY POSSIBLE BANDING;  Surgeon: Lodema Pilot, DO;  Location: [facility];  Service: General;  Laterality: N/A;   fibroids removed     LAPAROSCOPIC TOTAL HYSTERECTOMY  10/29/2006   MYOMECTOMY  12/18/2001   x 4   NASAL  TURBINATE REDUCTION Bilateral 02/07/2005   inferior and removed adenoids   ROTATOR CUFF REPAIR Right 2020   TONSILLECTOMY AND ADENOIDECTOMY  age 38   TUBAL LIGATION  07/08/2003   Family History  Problem Relation Age of Onset   Diabetes Mother    Fibroids Mother        uterine    Dementia Mother    Pancreatic cancer Father    Cancer Maternal Aunt        Breast   Breast cancer Maternal Aunt        late 26s    Cancer Maternal Aunt        Liver   Cancer Maternal Aunt        Throat   Heart disease Maternal Uncle        78s   Colon cancer Paternal Uncle    Clotting disorder Maternal Grandmother    Diabetes Maternal Grandmother    Liver cancer Maternal Grandfather    Diabetes Maternal Grandfather    Pancreatic cancer Paternal Grandmother    Diabetes Paternal Grandmother    Stomach cancer Paternal Grandfather    Diabetes Paternal Grandfather    Breast cancer Cousin        early 65s   Esophageal cancer Neg Hx    Sleep apnea Neg Hx    Outpatient Medications Prior to Visit  Medication Sig Dispense Refill   albuterol (VENTOLIN HFA) 108 (90 Base) MCG/ACT inhaler Inhale 1-2 puffs into the lungs every 6 (six) hours as needed for wheezing or shortness of breath. 1 each 0   amLODipine (NORVASC) 10 MG tablet TAKE 1 TABLET(10 MG) BY MOUTH DAILY 90 tablet 3   atorvastatin (LIPITOR) 20 MG tablet Take 1 tablet (20 mg total) by mouth daily. 90 tablet 3   beclomethasone (QVAR REDIHALER) 80 MCG/ACT inhaler INHALE 1 PUFF INTO THE LUNGS 2 TIMES DAILY 10.6 g 1   cetirizine (ZYRTEC) 10 MG tablet Take 1 tablet (10 mg total) by mouth daily. 90 tablet 0   cyclobenzaprine (FLEXERIL) 10 MG tablet Take 1 tablet (10 mg total) by mouth 2 (two) times daily as needed for muscle spasms. 20 tablet 0   EPINEPHrine (EPIPEN 2-PAK) 0.3 mg/0.3 mL IJ SOAJ injection Inject 0.3 mLs (0.3 mg total) into the muscle as needed (allergic reaction). 1 Device 1   ergocalciferol (VITAMIN D2) 1.25 MG (50000 UT) capsule Take 1 capsule by mouth every 7 (seven) days.     estradiol (ESTRACE) 0.5 MG tablet TAKE 1 TABLET(0.5 MG) BY MOUTH DAILY 90 tablet 3   famotidine (PEPCID) 20 MG tablet Take 1 tablet (20 mg total) by mouth 2 (two) times daily. 30 tablet 0   FLUoxetine (PROZAC) 20 MG capsule Take 1 capsule (20 mg total) by mouth daily. 90 capsule 3   fluticasone (FLONASE) 50 MCG/ACT nasal spray SHAKE LIQUID AND USE 2 SPRAYS IN EACH NOSTRIL DAILY  16 g 2   furosemide (LASIX) 20 MG tablet TAKE 1 TABLET BY MOUTH DAILY AS NEEDED 90 tablet 2   HYDROcodone bit-homatropine (HYCODAN) 5-1.5 MG/5ML syrup Take 5 mLs by mouth every 8 (eight) hours as needed for cough. 120 mL 0   ibuprofen (ADVIL) 600 MG tablet      Lancets (ONETOUCH DELICA PLUS LANCET30G) MISC daily. as directed     Levothyroxine Sodium (TIROSINT) 200 MCG CAPS Take 200 mg by mouth daily.     lidocaine (LIDODERM) 5 % Place 1 patch onto the skin daily. Remove & Discard patch within 12 hours  or as directed by MD 30 patch 0   metFORMIN (GLUCOPHAGE-XR) 500 MG 24 hr tablet Take by mouth.     naproxen (NAPROSYN) 375 MG tablet Take 1 tablet (375 mg total) by mouth 2 (two) times daily. 20 tablet 0   ondansetron (ZOFRAN) 4 MG tablet Take 1 tablet (4 mg total) by mouth every 8 (eight) hours as needed for nausea or vomiting. 40 tablet 0   ONETOUCH VERIO test strip daily.     OZEMPIC, 0.25 OR 0.5 MG/DOSE, 2 MG/3ML SOPN Inject 0.25 mg into the skin once a week.     pantoprazole (PROTONIX) 40 MG tablet Take 1 tablet (40 mg total) by mouth 2 (two) times daily. 60 tablet 3   TIROSINT 75 MCG CAPS Take 1 capsule by mouth daily.     No facility-administered medications prior to visit.   Allergies  Allergen Reactions   Ciprofloxacin Anaphylaxis   Quinolones Anaphylaxis   Bee Venom Swelling and Rash   Objective:   Today's Vitals   12/11/23 1118  BP: 138/82  Pulse: 72  Temp: 97.9 F (36.6 C)  TempSrc: Temporal  SpO2: 95%  Weight: 223 lb 3.2 oz (101.2 kg)  Height: 5\' 7"  (1.702 m)   Body mass index is 34.96 kg/m.   General: Well developed, well nourished. No acute distress. Psych: Alert and oriented. Normal mood and affect.  Health Maintenance Due  Topic Date Due   Pneumococcal Vaccine 86-38 Years old (1 of 2 - PCV) Never done   OPHTHALMOLOGY EXAM  02/23/2021   MAMMOGRAM  11/15/2023     Assessment & Plan:   Problem List Items Addressed This Visit       Cardiovascular and  Mediastinum   Essential hypertension - Primary   Blood pressure has been averaging above goal. I will switch her to amlodipine-olmesartan 10-20 mg daily.      Relevant Medications   amlodipine-olmesartan (AZOR) 10-20 MG tablet     Endocrine   Hypothyroidism due to Hashimoto's thyroiditis   I will recheck her TSH today. Continue levothyroxine (Tirosint) 275 mcg daily.       Relevant Orders   TSH   T4, free   Type 2 diabetes mellitus (HCC)   A1c is at goal. Continue metformin XR 500 mg daily and semaglutide (Ozempic) 0.5 mg weekly under the direction of endocrinology. I will reassess her A1c today.      Relevant Medications   amlodipine-olmesartan (AZOR) 10-20 MG tablet   Other Relevant Orders   Glucose, random   Hemoglobin A1c     Nervous and Auditory   Carpal tunnel syndrome of right wrist   Symptoms are consistent with carpal tunnel syndrome. Melanie Roberts plans to seek FMLA. I will refer her to a hand specialist for evaluation and consideration of surgery.      Relevant Orders   Ambulatory referral to Orthopedics     Other   Hyperlipidemia   Continue atorvastatin 20 mg daily.      Relevant Medications   amlodipine-olmesartan (AZOR) 10-20 MG tablet    Return in about 3 months (around 03/09/2024) for Reassessment.   Loyola Mast, MD

## 2024-01-02 ENCOUNTER — Ambulatory Visit
Admission: EM | Admit: 2024-01-02 | Discharge: 2024-01-02 | Disposition: A | Discharge status: Home / Self Care | Attending: Physician Assistant | Admitting: Physician Assistant

## 2024-01-02 ENCOUNTER — Other Ambulatory Visit: Payer: Self-pay

## 2024-01-02 ENCOUNTER — Encounter: Payer: Self-pay | Admitting: *Deleted

## 2024-01-02 DIAGNOSIS — M79671 Pain in right foot: Secondary | ICD-10-CM

## 2024-01-02 DIAGNOSIS — M79644 Pain in right finger(s): Secondary | ICD-10-CM | POA: Diagnosis not present

## 2024-01-02 DIAGNOSIS — M2550 Pain in unspecified joint: Secondary | ICD-10-CM

## 2024-01-02 DIAGNOSIS — M79641 Pain in right hand: Secondary | ICD-10-CM

## 2024-01-02 DIAGNOSIS — M79645 Pain in left finger(s): Secondary | ICD-10-CM | POA: Diagnosis not present

## 2024-01-02 DIAGNOSIS — M79672 Pain in left foot: Secondary | ICD-10-CM

## 2024-01-02 MED ORDER — ONDANSETRON HCL 4 MG PO TABS: 4.0000 mg | ORAL_TABLET | Freq: Three times a day (TID) | ORAL | 0 refills | Status: AC | PRN

## 2024-01-02 MED ORDER — DICLOFENAC SODIUM 75 MG PO TBEC: 75.0000 mg | DELAYED_RELEASE_TABLET | Freq: Two times a day (BID) | ORAL | 0 refills | Status: DC

## 2024-01-02 NOTE — ED Provider Notes (Signed)
 [facility]    CSN: [id number] Arrival date & time: 01/02/24  1347      History   Chief Complaint Chief Complaint  Patient presents with   Joint Swelling    Hand and toes    HPI Melanie Roberts is a 53 y.o. female.   Patient presents today with a several day history of polyarthralgia.  She reports that yesterday pain was rated 6/7 on a 0-10 pain scale but has improved today to a 2 though she continues to have stiffness and pain in her joints.  She denies any known injury, change in activity, recent fall, known trauma.  She does have a family history of arthritis but is unsure if this is rheumatoid arthritis or osteoarthritis.  She has never been diagnosed with either of these conditions.  She denies personal history of gout.  Denies any recent medication changes and does not take a thiazide diuretic.  She reports that pain is localized to the MCP joint of both thumbs as well as interphalangeal joints of right ring and middle finger.  She is also experiencing pain and swelling at her MTP joints bilaterally.  She denies any fever, nausea, vomiting.  She denies any known tick bites.  She is having difficulty with daily duties as result of symptoms.      Past Medical History:  Diagnosis Date   Allergy    Diabetes mellitus without complication (HCC)    GERD (gastroesophageal reflux disease)    Headache(784.0)    migraines   Hemorrhoids 12/2012   Hypertension    Hypothyroidism    Seasonal allergies    UTI (urinary tract infection)    Wears partial dentures    upper and lower    Patient Active Problem List   Diagnosis Date Noted   Carpal tunnel syndrome of right wrist 12/11/2023   Depression, major, single episode, moderate (HCC) 07/24/2023   Viral URI 07/24/2023   Macromastia 03/17/2022   Thyroid nodule 09/27/2021   Gastroesophageal reflux disease 09/27/2021   Low back pain 09/27/2021   Pedal edema 09/27/2021   Plantar fasciitis 09/27/2021   Essential  hypertension 09/27/2021   Type 2 diabetes mellitus (HCC) 04/06/2021   Vitamin D deficiency 10/05/2020   Pain in right elbow 11/13/2019   Impingement syndrome of right shoulder 10/29/2019   Nontraumatic incomplete tear of right rotator cuff 10/29/2019   Gastric nodule    Hypothyroidism due to Hashimoto's thyroiditis 01/26/2017   Hyperlipidemia 01/26/2017   Bee sting allergy 05/14/2016   Idiopathic urticaria 09/29/2015    Past Surgical History:  Procedure Laterality Date   ADENOIDECTOMY  02/17/2005   CESAREAN SECTION  07/08/2003   ESOPHAGOGASTRODUODENOSCOPY (EGD) WITH PROPOFOL N/A 06/19/2019   Procedure: ESOPHAGOGASTRODUODENOSCOPY (EGD) WITH PROPOFOL;  Surgeon: Rachael Fee, MD;  Location: [facility];  Service: Gastroenterology;  Laterality: N/A;   EUS N/A 06/19/2019   Procedure: UPPER ENDOSCOPIC ULTRASOUND (EUS) RADIAL;  Surgeon: Rachael Fee, MD;  Location: [facility];  Service: Gastroenterology;  Laterality: N/A;   EVALUATION UNDER ANESTHESIA WITH HEMORRHOIDECTOMY N/A 01/14/2013   Procedure: EXAM UNDER ANESTHESIA WITH HEMORRHOIDECTOMY POSSIBLE BANDING;  Surgeon: Lodema Pilot, DO;  Location: [facility];  Service: General;  Laterality: N/A;   fibroids removed     LAPAROSCOPIC TOTAL HYSTERECTOMY  10/29/2006   MYOMECTOMY  12/18/2001   x 4   NASAL TURBINATE REDUCTION Bilateral 02/07/2005   inferior and removed adenoids   ROTATOR CUFF REPAIR Right 2020   TONSILLECTOMY AND ADENOIDECTOMY  age 35   TUBAL LIGATION  07/08/2003    OB History   No obstetric history on file.      Home Medications    Prior to Admission medications   Medication Sig Start Date End Date Taking? Authorizing Provider  diclofenac (VOLTAREN) 75 MG EC tablet Take 1 tablet (75 mg total) by mouth 2 (two) times daily. 01/02/24  Yes Cathan Gearin K, PA-C  albuterol (VENTOLIN HFA) 108 (90 Base) MCG/ACT inhaler Inhale 1-2 puffs into the lungs every 6 (six) hours as needed for wheezing or  shortness of breath. 12/02/22   Gustavus Bryant, FNP  amLODipine (NORVASC) 10 MG tablet TAKE 1 TABLET(10 MG) BY MOUTH DAILY Patient not taking: Reported on 01/02/2024 11/13/23   Loyola Mast, MD  amlodipine-olmesartan (AZOR) 10-20 MG tablet Take 1 tablet by mouth daily. 12/11/23   Loyola Mast, MD  atorvastatin (LIPITOR) 20 MG tablet Take 1 tablet (20 mg total) by mouth daily. 07/25/23   Loyola Mast, MD  beclomethasone (QVAR REDIHALER) 80 MCG/ACT inhaler INHALE 1 PUFF INTO THE LUNGS 2 TIMES DAILY 09/13/23   Salvatore Decent, FNP  cetirizine (ZYRTEC) 10 MG tablet Take 1 tablet (10 mg total) by mouth daily. 06/29/21   Eulis Foster, FNP  cyclobenzaprine (FLEXERIL) 10 MG tablet Take 1 tablet (10 mg total) by mouth 2 (two) times daily as needed for muscle spasms. 08/06/23   Netta Corrigan, PA-C  EPINEPHrine (EPIPEN 2-PAK) 0.3 mg/0.3 mL IJ SOAJ injection Inject 0.3 mLs (0.3 mg total) into the muscle as needed (allergic reaction). 05/15/16   Whiteheart, Mellody Life, NP  ergocalciferol (VITAMIN D2) 1.25 MG (50000 UT) capsule Take 1 capsule by mouth every 7 (seven) days. 08/27/20   [provider]  estradiol (ESTRACE) 0.5 MG tablet TAKE 1 TABLET(0.5 MG) BY MOUTH DAILY 02/19/23   Loyola Mast, MD  famotidine (PEPCID) 20 MG tablet Take 1 tablet (20 mg total) by mouth 2 (two) times daily. 03/02/23   Carlisle Beers, FNP  FLUoxetine (PROZAC) 20 MG capsule Take 1 capsule (20 mg total) by mouth daily. 07/24/23   Loyola Mast, MD  fluticasone [facility]) 50 MCG/ACT nasal spray SHAKE LIQUID AND USE 2 SPRAYS IN [facility] NOSTRIL DAILY 04/22/20   Cirigliano, Mary K, DO  furosemide (LASIX) 20 MG tablet TAKE 1 TABLET BY MOUTH DAILY AS NEEDED 10/19/20   Cirigliano, Jearld Lesch, DO  Lancets (ONETOUCH DELICA PLUS [place]) MISC daily. as directed 08/03/21   [provider]  Levothyroxine Sodium (TIROSINT) 200 MCG CAPS Take 200 mg by mouth daily. 08/22/23   [provider]  metFORMIN (GLUCOPHAGE-XR)  500 MG 24 hr tablet Take by mouth. 08/16/21   [provider]  ondansetron (ZOFRAN) 4 MG tablet Take 1 tablet (4 mg total) by mouth every 8 (eight) hours as needed for nausea or vomiting. Patient not taking: Reported on 01/02/2024 11/06/19   Cristie Hem, PA-C  [facility] VERIO test strip daily. 08/03/21   [provider]  OZEMPIC, 0.25 OR 0.5 MG/DOSE, 2 MG/3ML SOPN Inject 0.25 mg into the skin once a week.    [provider]  pantoprazole (PROTONIX) 40 MG tablet Take 1 tablet (40 mg total) by mouth 2 (two) times daily. 05/19/22   Cirigliano, Vito V, DO  TIROSINT 75 MCG CAPS Take 1 capsule by mouth daily.    [provider]    Family History Family History  Problem Relation Age of Onset   Diabetes Mother  Fibroids Mother        uterine    Dementia Mother    Pancreatic cancer Father    Cancer Maternal Aunt        Breast   Breast cancer Maternal Aunt        late 28s   Cancer Maternal Aunt        Liver   Cancer Maternal Aunt        Throat   Heart disease Maternal Uncle        44s   Colon cancer Paternal Uncle    Clotting disorder Maternal Grandmother    Diabetes Maternal Grandmother    Liver cancer Maternal Grandfather    Diabetes Maternal Grandfather    Pancreatic cancer Paternal Grandmother    Diabetes Paternal Grandmother    Stomach cancer Paternal Grandfather    Diabetes Paternal Grandfather    Breast cancer Cousin        early 74s   Esophageal cancer Neg Hx    Sleep apnea Neg Hx     Social History Social History   Tobacco Use   Smoking status: Never   Smokeless tobacco: Never  Vaping Use   Vaping status: Never Used  Substance Use Topics   Alcohol use: Yes    Comment: socially   Drug use: No     Allergies   Ciprofloxacin, Quinolones, and Bee venom   Review of Systems Review of Systems  Constitutional:  Positive for activity change. Negative for appetite change, fatigue and fever.  Gastrointestinal:  Negative for  abdominal pain, diarrhea, nausea and vomiting.  Musculoskeletal:  Positive for arthralgias, joint swelling and myalgias.  Skin:  Negative for color change and wound.  Neurological:  Negative for weakness and numbness.     Physical Exam Triage Vital Signs ED Triage Vitals  Encounter Vitals Group     BP 01/02/24 1446 120/81     Systolic BP Percentile --      Diastolic BP Percentile --      Pulse Rate 01/02/24 1446 82     Resp 01/02/24 1446 18     Temp 01/02/24 1446 97.9 F (36.6 C)     Temp Source 01/02/24 1446 Oral     SpO2 01/02/24 1446 95 %     Weight --      Height --      Head Circumference --      Peak Flow --      Pain Score 01/02/24 1443 2     Pain Loc --      Pain Education --      Exclude from Growth Chart --    No data found.  Updated Vital Signs BP 120/81 (BP Location: Left Arm)   Pulse 82   Temp 97.9 F (36.6 C) (Oral)   Resp 18   SpO2 95%   Visual Acuity Right Eye Distance:   Left Eye Distance:   Bilateral Distance:    Right Eye Near:   Left Eye Near:    Bilateral Near:     Physical Exam Vitals reviewed.  Constitutional:      General: She is awake. She is not in acute distress.    Appearance: Normal appearance. She is well-developed. She is not ill-appearing.     Comments: Very pleasant female appears stated age in no acute distress   HENT:     Head: Normocephalic and atraumatic.  Cardiovascular:     Rate and Rhythm: Normal rate and regular rhythm.  Heart sounds: Normal heart sounds, S1 normal and S2 normal. No murmur heard.    Comments: Capillary fill within 2 seconds bilateral fingers and toes Pulmonary:     Effort: Pulmonary effort is normal.     Breath sounds: Normal breath sounds. No wheezing, rhonchi or rales.     Comments: Clear to auscultation bilaterally Musculoskeletal:     Comments: Hands: Hands are neurovascularly intact.  Normal pincer grip strength.  Normal to point discrimination.  Mild tenderness palpation over bilateral  first MCP joints without deformity.  Feet: Normal sensation.  Feet neurovascularly intact.  No focal pain or tenderness.  Ankle: No focal tenderness.  Normal active range of motion.  Psychiatric:        Behavior: Behavior is cooperative.      UC Treatments / Results  Labs (all labs ordered are listed, but only abnormal results are displayed) Labs Reviewed  CBC WITH DIFFERENTIAL/PLATELET  COMPREHENSIVE METABOLIC PANEL  SEDIMENTATION RATE  URIC ACID  RHEUMATOID FACTOR  CYCLIC CITRUL PEPTIDE ANTIBODY, IGG/IGA    EKG   Radiology No results found.  Procedures Procedures (including critical care time)  Medications Ordered in UC Medications - No data to display  Initial Impression / Assessment and Plan / UC Course  I have reviewed the triage vital signs and the nursing notes.  Pertinent labs & imaging results that were available during my care of the patient were reviewed by me and considered in my medical decision making (see chart for details).     Patient is well-appearing, afebrile, nontoxic, nontachycardic.  We discussed potential utility of plain films, however, given she has no deformity and denies any recent trauma we will defer plain films for the time being.  We did discuss potential utility of blood work to investigate potential causes of her symptoms which she was interested in obtaining.  Will obtain CBC, CMP, ESR, uric acid, anti-CCP, RF.  We will contact her if any of these are abnormal and change our treatment plan.  She was encouraged to avoid strenuous activity including grasping or prolonged ambulation.  She was started on diclofenac to help with pain and we discussed that she is not to take additional NSAIDs with this medication due to risk of GI bleeding.  She can use Tylenol for breakthrough pain.  Recommended close follow-up with her primary care.  If anything worsens and she has increasing pain, swelling of her joints, numbness or paresthesias, fever,  nausea/vomiting she needs to be seen immediately.  Strict return precautions given.  Excuse note provided.  Final Clinical Impressions(s) / UC Diagnoses   Final diagnoses:  Polyarthralgia  Bilateral thumb pain  Bilateral foot pain  Pain in right hand     Discharge Instructions      Start diclofenac twice daily for 10 days.  Do not take additional NSAIDs with this medication including aspirin, ibuprofen/Advil, naproxen/Aleve.  You can use Tylenol/acetaminophen as needed.  Rest and drink plenty of fluid.  Follow-up with orthopedics if your symptoms are not improving.  I will contact you if any of your blood work is abnormal.  If anything worsens and you have increasing pain, redness, swelling, fever, nausea, vomiting, numbness or tingling you need to be seen immediately.     ED Prescriptions     Medication Sig Dispense Auth. Provider   diclofenac (VOLTAREN) 75 MG EC tablet Take 1 tablet (75 mg total) by mouth 2 (two) times daily. 20 tablet Andreu Drudge, Noberto Retort, PA-C      PDMP  not reviewed this encounter.   Jeani Hawking, PA-C 01/02/24 1523

## 2024-01-02 NOTE — ED Triage Notes (Signed)
 C/o pain and swelling in both big toes, left ankle, and right hand for a few days. 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 She took aleve last night with some relief

## 2024-01-02 NOTE — Discharge Instructions (Signed)
 Start diclofenac twice daily for 10 days.  Do not take additional NSAIDs with this medication including aspirin, ibuprofen/Advil, naproxen/Aleve.  You can use Tylenol/acetaminophen as needed.  Rest and drink plenty of fluid.  Follow-up with orthopedics if your symptoms are not improving.  I will contact you if any of your blood work is abnormal.  If anything worsens and you have increasing pain, redness, swelling, fever, nausea, vomiting, numbness or tingling you need to be seen immediately.

## 2024-01-04 ENCOUNTER — Ambulatory Visit: Admission: EM | Admit: 2024-01-04 | Discharge: 2024-01-04 | Discharge status: Against Medical Advice

## 2024-01-04 DIAGNOSIS — M79642 Pain in left hand: Secondary | ICD-10-CM | POA: Diagnosis not present

## 2024-01-07 LAB — CBC WITH DIFFERENTIAL/PLATELET
Basophils Absolute: 0.1 10*3/uL (ref 0.0–0.2)
Basos: 1 %
EOS (ABSOLUTE): 0.1 10*3/uL (ref 0.0–0.4)
Eos: 1 %
Hematocrit: 37 % (ref 34.0–46.6)
Hemoglobin: 12.2 g/dL (ref 11.1–15.9)
Immature Grans (Abs): 0 10*3/uL (ref 0.0–0.1)
Immature Granulocytes: 0 %
Lymphocytes Absolute: 2.8 10*3/uL (ref 0.7–3.1)
Lymphs: 50 %
MCH: 29 pg (ref 26.6–33.0)
MCHC: 33 g/dL (ref 31.5–35.7)
MCV: 88 fL (ref 79–97)
Monocytes Absolute: 0.3 10*3/uL (ref 0.1–0.9)
Monocytes: 6 %
Neutrophils Absolute: 2.3 10*3/uL (ref 1.4–7.0)
Neutrophils: 42 %
Platelets: 343 10*3/uL (ref 150–450)
RBC: 4.2 x10E6/uL (ref 3.77–5.28)
RDW: 14.1 % (ref 11.7–15.4)
WBC: 5.5 10*3/uL (ref 3.4–10.8)

## 2024-01-07 LAB — COMPREHENSIVE METABOLIC PANEL
ALT: 13 IU/L (ref 0–32)
AST: 16 IU/L (ref 0–40)
Albumin: 4.6 g/dL (ref 3.8–4.9)
Alkaline Phosphatase: 51 IU/L (ref 44–121)
BUN/Creatinine Ratio: 18 (ref 9–23)
BUN: 13 mg/dL (ref 6–24)
Bilirubin Total: 0.3 mg/dL (ref 0.0–1.2)
CO2: 21 mmol/L (ref 20–29)
Calcium: 10.4 mg/dL — ABNORMAL HIGH (ref 8.7–10.2)
Chloride: 104 mmol/L (ref 96–106)
Creatinine, Ser: 0.74 mg/dL (ref 0.57–1.00)
Globulin, Total: 2.8 g/dL (ref 1.5–4.5)
Glucose: 77 mg/dL (ref 70–99)
Potassium: 4.4 mmol/L (ref 3.5–5.2)
Sodium: 144 mmol/L (ref 134–144)
Total Protein: 7.4 g/dL (ref 6.0–8.5)
eGFR: 97 mL/min/{1.73_m2} (ref >59)

## 2024-01-07 LAB — URIC ACID: Uric Acid: 5.5 mg/dL (ref 3.0–7.2)

## 2024-01-07 LAB — RHEUMATOID FACTOR: Rheumatoid fact SerPl-aCnc: <10 [IU]/mL (ref <14.0)

## 2024-01-07 LAB — SEDIMENTATION RATE: Sed Rate: 30 mm/h (ref 0–40)

## 2024-01-07 LAB — CYCLIC CITRUL PEPTIDE ANTIBODY, IGG/IGA: Cyclic Citrullin Peptide Ab: 7 U (ref 0–19)

## 2024-01-15 ENCOUNTER — Ambulatory Visit
Admission: RE | Admit: 2024-01-15 | Discharge: 2024-01-15 | Disposition: A | Discharge status: Home / Self Care | Source: Ambulatory Visit | Attending: Family Medicine

## 2024-01-15 ENCOUNTER — Other Ambulatory Visit: Payer: Self-pay | Admitting: Family Medicine

## 2024-01-15 DIAGNOSIS — M180 Bilateral primary osteoarthritis of first carpometacarpal joints: Secondary | ICD-10-CM | POA: Diagnosis not present

## 2024-01-15 DIAGNOSIS — Z1231 Encounter for screening mammogram for malignant neoplasm of breast: Secondary | ICD-10-CM

## 2024-01-15 DIAGNOSIS — G5601 Carpal tunnel syndrome, right upper limb: Secondary | ICD-10-CM | POA: Diagnosis not present

## 2024-01-15 DIAGNOSIS — M109 Gout, unspecified: Secondary | ICD-10-CM | POA: Diagnosis not present

## 2024-01-15 DIAGNOSIS — N6310 Unspecified lump in the right breast, unspecified quadrant: Secondary | ICD-10-CM

## 2024-01-15 DIAGNOSIS — N644 Mastodynia: Secondary | ICD-10-CM

## 2024-01-28 ENCOUNTER — Ambulatory Visit
Admission: RE | Admit: 2024-01-28 | Discharge: 2024-01-28 | Disposition: A | Discharge status: Home / Self Care | Source: Ambulatory Visit | Attending: Family Medicine

## 2024-01-28 DIAGNOSIS — N6310 Unspecified lump in the right breast, unspecified quadrant: Secondary | ICD-10-CM

## 2024-01-28 DIAGNOSIS — N644 Mastodynia: Secondary | ICD-10-CM

## 2024-03-03 ENCOUNTER — Encounter (HOSPITAL_COMMUNITY): Payer: Self-pay

## 2024-03-03 DIAGNOSIS — G5601 Carpal tunnel syndrome, right upper limb: Secondary | ICD-10-CM | POA: Diagnosis not present

## 2024-03-10 ENCOUNTER — Ambulatory Visit: Payer: Federal, State, Local not specified - PPO | Admitting: Family Medicine

## 2024-03-10 ENCOUNTER — Encounter: Payer: Self-pay | Admitting: Family Medicine

## 2024-03-10 ENCOUNTER — Telehealth: Payer: Self-pay

## 2024-03-10 VITALS — BP 122/74 | HR 74 | Temp 97.6°F | Ht 67.0 in | Wt 214.2 lb

## 2024-03-10 DIAGNOSIS — E119 Type 2 diabetes mellitus without complications: Secondary | ICD-10-CM | POA: Diagnosis not present

## 2024-03-10 DIAGNOSIS — N951 Menopausal and female climacteric states: Secondary | ICD-10-CM

## 2024-03-10 DIAGNOSIS — E063 Autoimmune thyroiditis: Secondary | ICD-10-CM

## 2024-03-10 DIAGNOSIS — E782 Mixed hyperlipidemia: Secondary | ICD-10-CM | POA: Diagnosis not present

## 2024-03-10 DIAGNOSIS — G5601 Carpal tunnel syndrome, right upper limb: Secondary | ICD-10-CM

## 2024-03-10 DIAGNOSIS — Z7984 Long term (current) use of oral hypoglycemic drugs: Secondary | ICD-10-CM

## 2024-03-10 DIAGNOSIS — Z7985 Long-term (current) use of injectable non-insulin antidiabetic drugs: Secondary | ICD-10-CM

## 2024-03-10 DIAGNOSIS — K2100 Gastro-esophageal reflux disease with esophagitis, without bleeding: Secondary | ICD-10-CM

## 2024-03-10 DIAGNOSIS — I10 Essential (primary) hypertension: Secondary | ICD-10-CM | POA: Diagnosis not present

## 2024-03-10 LAB — GLUCOSE, RANDOM: Glucose, Bld: 96 mg/dL (ref 70–99)

## 2024-03-10 LAB — HEMOGLOBIN A1C: Hgb A1c MFr Bld: 6.3 % (ref 4.6–6.5)

## 2024-03-10 MED ORDER — LEVOTHYROXINE SODIUM 200 MCG PO CAPS
200.0000 ug | ORAL_CAPSULE | Freq: Every day | ORAL | 3 refills | Status: AC
Start: 2024-03-10 — End: ?

## 2024-03-10 MED ORDER — SEMAGLUTIDE (1 MG/DOSE) 4 MG/3ML ~~LOC~~ SOPN: 1.0000 mg | PEN_INJECTOR | SUBCUTANEOUS | Status: DC

## 2024-03-10 MED ORDER — ESTRADIOL 0.5 MG PO TABS: 0.5000 mg | ORAL_TABLET | Freq: Every day | ORAL | 3 refills | Status: AC

## 2024-03-10 MED ORDER — TIROSINT 75 MCG PO CAPS: 1.0000 | ORAL_CAPSULE | Freq: Every day | ORAL | 3 refills | Status: AC

## 2024-03-10 MED ORDER — PANTOPRAZOLE SODIUM 40 MG PO TBEC: 40.0000 mg | DELAYED_RELEASE_TABLET | Freq: Two times a day (BID) | ORAL | 3 refills | Status: AC

## 2024-03-10 NOTE — Telephone Encounter (Signed)
 Copied from CRM [id number]. Topic: Clinical - Prescription Issue >> Mar 10, 2024  2:54 PM Kita Perish H wrote: Reason for CRM: Caris with [facility] calling in regards to the prescription sent in for the Levothyroxine  Sodium (TIROSINT ) 200 MCG CAPS, states capsules are discontinued and did he want to change to tablets or something different.  Caris [facility]  [phone] fax

## 2024-03-10 NOTE — Assessment & Plan Note (Signed)
 Blood pressure is at goal. Continue amlodipine -olmesartan  (Azor ) 10-20 mg daily.

## 2024-03-10 NOTE — Assessment & Plan Note (Signed)
 A1c is at goal. Continue metformin XR 500 mg daily and semaglutide  (Ozempic ) 1 mg weekly under the direction of endocrinology. I will reassess her A1c today.

## 2024-03-10 NOTE — Assessment & Plan Note (Signed)
 NCS has confirmed severe CTS. She will follow-up with orthopedics tomorrow.

## 2024-03-10 NOTE — Assessment & Plan Note (Signed)
 Continue atorvastatin 20mg  daily

## 2024-03-10 NOTE — Assessment & Plan Note (Signed)
 Stable. Continue pantoprazole 40mg  daily

## 2024-03-10 NOTE — Assessment & Plan Note (Signed)
 I will recheck her TSH today. Continue levothyroxine  (Tirosint ) 200 mcg + 75 mcg (total daily dose = 275 mcg) daily.

## 2024-03-10 NOTE — Progress Notes (Signed)
 [facility] [address] [place] [address] Dept: [phone] Dept Fax: [phone]  Chronic Care Office Visit  Subjective:    Patient ID: Melanie Roberts, female    DOB: 11-07-1970, 53 y.o..   MRN: [id number]  Chief Complaint  Patient presents with   Hypertension    3 month f/u HTN.     History of Present Illness:  Patient is in today for reassessment of chronic medical issues.  Ms. Harrison Lin has a history of Type 2 diabetes. She is managed on metformin XR 500 mg daily semaglutide  (Ozempic ) 1 mg weekly. She is tolerating this well.    Ms. Harrison Lin has a history of hypothyroidism. She is managed by Ms. Cotton.Cordon (endocrinology NP) and is currently on Tirosint  (levothyroxine ) 200 mcg + 75 mcg (total daily dose = 275 mcg) daily.   Ms. Harrison Lin has a history of  hypertension and is managed with amlodipine -olmesartan  (Azor ) 10-20 mg daily.   Ms. Harrison Lin has hyperlipidemia. She is managed on atorvastatin  20 mg daily.    Ms. Denece Finger has been diagnosed with severe right carpal tunnel syndrome. She will be seeing Dr. Glenora Laos (orthopedics) tomorrow to discuss therapy. She believes this will involve surgery.  Past Medical History: Patient Active Problem List   Diagnosis Date Noted   Carpal tunnel syndrome of right wrist 12/11/2023   Depression, major, single episode, moderate (HCC) 07/24/2023   Viral URI 07/24/2023   Macromastia 03/17/2022   Thyroid  nodule 09/27/2021   Gastroesophageal reflux disease 09/27/2021   Low back pain 09/27/2021   Pedal edema 09/27/2021   Plantar fasciitis 09/27/2021   Essential hypertension 09/27/2021   Type 2 diabetes mellitus (HCC) 04/06/2021   Vitamin D deficiency 10/05/2020   Pain in right elbow 11/13/2019   Impingement syndrome of right shoulder 10/29/2019   Nontraumatic incomplete tear of right rotator cuff 10/29/2019   Gastric nodule    Hypothyroidism due to Hashimoto's thyroiditis  01/26/2017   Hyperlipidemia 01/26/2017   Bee sting allergy 05/14/2016   Idiopathic urticaria 09/29/2015   Past Surgical History:  Procedure Laterality Date   ADENOIDECTOMY  02/17/2005   CESAREAN SECTION  07/08/2003   ESOPHAGOGASTRODUODENOSCOPY (EGD) WITH PROPOFOL  N/A 06/19/2019   Procedure: ESOPHAGOGASTRODUODENOSCOPY (EGD) WITH PROPOFOL ;  Surgeon: Janel Medford, MD;  Location: [facility];  Service: Gastroenterology;  Laterality: N/A;   EUS N/A 06/19/2019   Procedure: UPPER ENDOSCOPIC ULTRASOUND (EUS) RADIAL;  Surgeon: Janel Medford, MD;  Location: [facility];  Service: Gastroenterology;  Laterality: N/A;   EVALUATION UNDER ANESTHESIA WITH HEMORRHOIDECTOMY N/A 01/14/2013   Procedure: EXAM UNDER ANESTHESIA WITH HEMORRHOIDECTOMY POSSIBLE BANDING;  Surgeon: Evander Hills, DO;  Location: [facility];  Service: General;  Laterality: N/A;   fibroids removed     LAPAROSCOPIC TOTAL HYSTERECTOMY  10/29/2006   MYOMECTOMY  12/18/2001   x 4   NASAL TURBINATE REDUCTION Bilateral 02/07/2005   inferior and removed adenoids   ROTATOR CUFF REPAIR Right 2020   TONSILLECTOMY AND ADENOIDECTOMY  age 3   TUBAL LIGATION  07/08/2003   Family History  Problem Relation Age of Onset   Breast cancer Mother 8   Diabetes Mother    Fibroids Mother        uterine    Dementia Mother    Pancreatic cancer Father    Cancer Maternal Aunt        Breast   Breast cancer Maternal Aunt        late 79s   Cancer Maternal  Aunt        Liver   Cancer Maternal Aunt        Throat   Heart disease Maternal Uncle        54s   Colon cancer Paternal Uncle    Clotting disorder Maternal Grandmother    Diabetes Maternal Grandmother    Liver cancer Maternal Grandfather    Diabetes Maternal Grandfather    Pancreatic cancer Paternal Grandmother    Diabetes Paternal Grandmother    Stomach cancer Paternal Grandfather    Diabetes Paternal Grandfather    Breast cancer Cousin        early 56s    Esophageal cancer Neg Hx    Sleep apnea Neg Hx    Outpatient Medications Prior to Visit  Medication Sig Dispense Refill   albuterol  (VENTOLIN  HFA) 108 (90 Base) MCG/ACT inhaler Inhale 1-2 puffs into the lungs every 6 (six) hours as needed for wheezing or shortness of breath. 1 each 0   amlodipine -olmesartan  (AZOR ) 10-20 MG tablet Take 1 tablet by mouth daily. 90 tablet 3   atorvastatin  (LIPITOR) 20 MG tablet Take 1 tablet (20 mg total) by mouth daily. 90 tablet 3   beclomethasone (QVAR  REDIHALER) 80 MCG/ACT inhaler INHALE 1 PUFF INTO THE LUNGS 2 TIMES DAILY 10.6 g 1   cetirizine  (ZYRTEC ) 10 MG tablet Take 1 tablet (10 mg total) by mouth daily. 90 tablet 0   cyclobenzaprine  (FLEXERIL ) 10 MG tablet Take 1 tablet (10 mg total) by mouth 2 (two) times daily as needed for muscle spasms. 20 tablet 0   diclofenac  (VOLTAREN ) 75 MG EC tablet Take 1 tablet (75 mg total) by mouth 2 (two) times daily. 20 tablet 0   EPINEPHrine  (EPIPEN  2-PAK) 0.3 mg/0.3 mL IJ SOAJ injection Inject 0.3 mLs (0.3 mg total) into the muscle as needed (allergic reaction). 1 Device 1   ergocalciferol (VITAMIN D2) 1.25 MG (50000 UT) capsule Take 1 capsule by mouth every 7 (seven) days.     famotidine  (PEPCID ) 20 MG tablet Take 1 tablet (20 mg total) by mouth 2 (two) times daily. 30 tablet 0   FLUoxetine  (PROZAC ) 20 MG capsule Take 1 capsule (20 mg total) by mouth daily. 90 capsule 3   fluticasone  (FLONASE ) 50 MCG/ACT nasal spray SHAKE LIQUID AND USE 2 SPRAYS IN EACH NOSTRIL DAILY 16 g 2   furosemide  (LASIX ) 20 MG tablet TAKE 1 TABLET BY MOUTH DAILY AS NEEDED 90 tablet 2   Lancets (ONETOUCH DELICA PLUS LANCET30G) MISC daily. as directed     metFORMIN (GLUCOPHAGE-XR) 500 MG 24 hr tablet Take by mouth.     ondansetron  (ZOFRAN ) 4 MG tablet Take 1 tablet (4 mg total) by mouth every 8 (eight) hours as needed for nausea or vomiting. 15 tablet 0   ONETOUCH VERIO test strip daily.     OZEMPIC , 0.25 OR 0.5 MG/DOSE, 2 MG/3ML SOPN Inject 0.25  mg into the skin once a week.     TIROSINT  75 MCG CAPS Take 1 capsule by mouth daily.     estradiol  (ESTRACE ) 0.5 MG tablet TAKE 1 TABLET(0.5 MG) BY MOUTH DAILY 90 tablet 3   Levothyroxine  Sodium (TIROSINT ) 200 MCG CAPS Take 200 mg by mouth daily.     pantoprazole  (PROTONIX ) 40 MG tablet Take 1 tablet (40 mg total) by mouth 2 (two) times daily. 60 tablet 3   amLODipine  (NORVASC ) 10 MG tablet TAKE 1 TABLET(10 MG) BY MOUTH DAILY (Patient not taking: Reported on 01/02/2024) 90 tablet 3   No  facility-administered medications prior to visit.   Allergies  Allergen Reactions   Ciprofloxacin Anaphylaxis   Quinolones Anaphylaxis   Bee Venom Swelling and Rash   Objective:   Today's Vitals   03/10/24 0907  BP: 122/74  Pulse: 74  Temp: 97.6 F (36.4 C)  TempSrc: Temporal  SpO2: 98%  Weight: 214 lb 3.2 oz (97.2 kg)  Height: 5\' 7"  (1.702 m)   Body mass index is 33.55 kg/m.   General: Well developed, well nourished. No acute distress. Psych: Alert and oriented. Normal mood and affect.  Health Maintenance Due  Topic Date Due   Pneumococcal Vaccine 63-12 Years old (1 of 2 - PCV) Never done   OPHTHALMOLOGY EXAM  02/23/2021     Assessment & Plan:   Problem List Items Addressed This Visit       Cardiovascular and Mediastinum   Essential hypertension - Primary   Blood pressure is at goal. Continue amlodipine -olmesartan  (Azor ) 10-20 mg daily.        Digestive   Gastroesophageal reflux disease   Stable. Continue pantoprazole  40 mg daily.      Relevant Medications   pantoprazole  (PROTONIX ) 40 MG tablet     Endocrine   Hypothyroidism due to Hashimoto's thyroiditis   I will recheck her TSH today. Continue levothyroxine  (Tirosint ) 200 mcg + 75 mcg (total daily dose = 275 mcg) daily.      Relevant Medications   Levothyroxine  Sodium (TIROSINT ) 200 MCG CAPS   TIROSINT  75 MCG CAPS   Other Relevant Orders   TSH   T4, free   Type 2 diabetes mellitus (HCC)   A1c is at goal.  Continue metformin XR 500 mg daily and semaglutide  (Ozempic ) 1 mg weekly under the direction of endocrinology. I will reassess her A1c today.      Relevant Medications   Semaglutide , 1 MG/DOSE, 4 MG/3ML SOPN   Other Relevant Orders   Glucose, random   Hemoglobin A1c     Nervous and Auditory   Carpal tunnel syndrome of right wrist   NCS has confirmed severe CTS. She will follow-up with orthopedics tomorrow.        Other   Hyperlipidemia   Continue atorvastatin  20 mg daily.      Other Visit Diagnoses       Hot flashes due to menopause       Relevant Medications   estradiol  (ESTRACE ) 0.5 MG tablet       Return in about 3 months (around 06/10/2024) for Reassessment.   Graig Lawyer, MD

## 2024-03-11 ENCOUNTER — Ambulatory Visit: Payer: Self-pay | Admitting: Family Medicine

## 2024-03-11 DIAGNOSIS — G5601 Carpal tunnel syndrome, right upper limb: Secondary | ICD-10-CM | POA: Diagnosis not present

## 2024-03-11 LAB — T4, FREE: Free T4: 0.8 ng/dL (ref 0.60–1.60)

## 2024-03-11 LAB — TSH: TSH: 5.07 u[IU]/mL (ref 0.35–5.50)

## 2024-03-11 NOTE — Telephone Encounter (Addendum)
 Generic is on backorder. The system will fill with the cheapest generic substitution that is available. No actions needed at this time. Pt has been provided with this update. Advised to call us  or send a [facility] Dt message if she receives a call from the pharmacy that action is needed on our behalf.

## 2024-03-11 NOTE — Telephone Encounter (Signed)
 Called pt. She is ok with tablets.

## 2024-03-24 DIAGNOSIS — E041 Nontoxic single thyroid nodule: Secondary | ICD-10-CM | POA: Diagnosis not present

## 2024-03-24 DIAGNOSIS — E119 Type 2 diabetes mellitus without complications: Secondary | ICD-10-CM | POA: Diagnosis not present

## 2024-03-24 DIAGNOSIS — Z7985 Long-term (current) use of injectable non-insulin antidiabetic drugs: Secondary | ICD-10-CM | POA: Diagnosis not present

## 2024-03-24 DIAGNOSIS — E1165 Type 2 diabetes mellitus with hyperglycemia: Secondary | ICD-10-CM | POA: Diagnosis not present

## 2024-03-24 DIAGNOSIS — E063 Autoimmune thyroiditis: Secondary | ICD-10-CM | POA: Diagnosis not present

## 2024-03-26 ENCOUNTER — Encounter (HOSPITAL_BASED_OUTPATIENT_CLINIC_OR_DEPARTMENT_OTHER): Payer: Self-pay | Admitting: Orthopedic Surgery

## 2024-03-26 ENCOUNTER — Telehealth: Payer: Self-pay

## 2024-03-26 ENCOUNTER — Other Ambulatory Visit: Payer: Self-pay

## 2024-03-26 NOTE — Telephone Encounter (Signed)
 Called patient and then called pharmacy.  Patient needs to contact insurance and ask about price.   Patient notified VIA phone. Dm/cma

## 2024-03-26 NOTE — Telephone Encounter (Signed)
 Copied from CRM [id number]. Topic: Clinical - Medication Question >> Mar 25, 2024  4:19 PM [place] E wrote: Reason for CRM: Patient called in regarding her Semaglutide , 1 MG/DOSE, 4 MG/3ML SOPN. Patient's endocrinologist wanted to increase dosage to 2 MG for $135 a month whereas patient stated that her PCP was able to do this for $25 a month in the past. Patient questioning if this can be done through PCP instead of endocrinology.

## 2024-03-26 NOTE — Progress Notes (Signed)
   03/26/24 1436  PAT Phone Screen  Is the patient taking a GLP-1 receptor agonist? (S)  Yes  Has the patient been informed on holding medication? (S)  Yes (last dose 5/26)  Do You Have Diabetes? (S)  Yes  Do You Have Hypertension? (S)  Yes  Have You Ever Been to the ER for Asthma? No  Have You Taken Oral Steroids in the Past 3 Months? No  Do you Take Phenteramine or any Other Diet Drugs? No  Recent  Lab Work, EKG, CXR? No  Do you have a history of heart problems? No  Any Recent Hospitalizations? No  Height 5\' 7"  (1.702 m)  Weight 97.1 kg  Pat Appointment Scheduled (S)  Yes (EKG, BMP)

## 2024-04-01 ENCOUNTER — Other Ambulatory Visit: Payer: Self-pay

## 2024-04-01 ENCOUNTER — Encounter (HOSPITAL_BASED_OUTPATIENT_CLINIC_OR_DEPARTMENT_OTHER)
Admission: RE | Admit: 2024-04-01 | Discharge: 2024-04-01 | Disposition: A | Discharge status: Home / Self Care | Source: Ambulatory Visit | Attending: Orthopedic Surgery | Admitting: Orthopedic Surgery

## 2024-04-01 DIAGNOSIS — K219 Gastro-esophageal reflux disease without esophagitis: Secondary | ICD-10-CM | POA: Diagnosis not present

## 2024-04-01 DIAGNOSIS — Z7985 Long-term (current) use of injectable non-insulin antidiabetic drugs: Secondary | ICD-10-CM | POA: Diagnosis not present

## 2024-04-01 DIAGNOSIS — Z01818 Encounter for other preprocedural examination: Secondary | ICD-10-CM | POA: Insufficient documentation

## 2024-04-01 DIAGNOSIS — G5601 Carpal tunnel syndrome, right upper limb: Secondary | ICD-10-CM | POA: Diagnosis not present

## 2024-04-01 DIAGNOSIS — I10 Essential (primary) hypertension: Secondary | ICD-10-CM | POA: Diagnosis not present

## 2024-04-01 DIAGNOSIS — E119 Type 2 diabetes mellitus without complications: Secondary | ICD-10-CM | POA: Diagnosis not present

## 2024-04-01 DIAGNOSIS — Z7984 Long term (current) use of oral hypoglycemic drugs: Secondary | ICD-10-CM | POA: Diagnosis not present

## 2024-04-01 DIAGNOSIS — F32A Depression, unspecified: Secondary | ICD-10-CM | POA: Diagnosis not present

## 2024-04-01 DIAGNOSIS — E039 Hypothyroidism, unspecified: Secondary | ICD-10-CM | POA: Diagnosis not present

## 2024-04-01 LAB — BASIC METABOLIC PANEL WITH GFR
Anion gap: 11 (ref 5–15)
BUN: 8 mg/dL (ref 6–20)
CO2: 27 mmol/L (ref 22–32)
Calcium: 9.9 mg/dL (ref 8.9–10.3)
Chloride: 103 mmol/L (ref 98–111)
Creatinine, Ser: 0.79 mg/dL (ref 0.44–1.00)
GFR, Estimated: >60 mL/min (ref >60)
Glucose, Bld: 116 mg/dL — ABNORMAL HIGH (ref 70–99)
Potassium: 3.6 mmol/L (ref 3.5–5.1)
Sodium: 141 mmol/L (ref 135–145)

## 2024-04-01 NOTE — Progress Notes (Signed)
 Presurgery drink (G2) given with instruction to finish by 0600. Pt verbalized understanding.

## 2024-04-02 ENCOUNTER — Encounter (HOSPITAL_BASED_OUTPATIENT_CLINIC_OR_DEPARTMENT_OTHER)
Admission: RE | Disposition: A | Discharge status: Home / Self Care | Payer: Self-pay | Source: Home / Self Care | Attending: Orthopedic Surgery

## 2024-04-02 ENCOUNTER — Ambulatory Visit (HOSPITAL_BASED_OUTPATIENT_CLINIC_OR_DEPARTMENT_OTHER)
Admission: RE | Admit: 2024-04-02 | Discharge: 2024-04-02 | Disposition: A | Discharge status: Home / Self Care | Attending: Orthopedic Surgery | Admitting: Orthopedic Surgery

## 2024-04-02 ENCOUNTER — Other Ambulatory Visit: Payer: Self-pay

## 2024-04-02 ENCOUNTER — Ambulatory Visit (HOSPITAL_BASED_OUTPATIENT_CLINIC_OR_DEPARTMENT_OTHER): Admitting: Anesthesiology

## 2024-04-02 ENCOUNTER — Encounter (HOSPITAL_BASED_OUTPATIENT_CLINIC_OR_DEPARTMENT_OTHER): Payer: Self-pay | Admitting: Orthopedic Surgery

## 2024-04-02 DIAGNOSIS — Z7984 Long term (current) use of oral hypoglycemic drugs: Secondary | ICD-10-CM | POA: Diagnosis not present

## 2024-04-02 DIAGNOSIS — E119 Type 2 diabetes mellitus without complications: Secondary | ICD-10-CM | POA: Insufficient documentation

## 2024-04-02 DIAGNOSIS — F32A Depression, unspecified: Secondary | ICD-10-CM | POA: Diagnosis not present

## 2024-04-02 DIAGNOSIS — Z7985 Long-term (current) use of injectable non-insulin antidiabetic drugs: Secondary | ICD-10-CM | POA: Diagnosis not present

## 2024-04-02 DIAGNOSIS — K219 Gastro-esophageal reflux disease without esophagitis: Secondary | ICD-10-CM | POA: Insufficient documentation

## 2024-04-02 DIAGNOSIS — E039 Hypothyroidism, unspecified: Secondary | ICD-10-CM | POA: Diagnosis not present

## 2024-04-02 DIAGNOSIS — E785 Hyperlipidemia, unspecified: Secondary | ICD-10-CM | POA: Diagnosis not present

## 2024-04-02 DIAGNOSIS — G5601 Carpal tunnel syndrome, right upper limb: Secondary | ICD-10-CM | POA: Insufficient documentation

## 2024-04-02 DIAGNOSIS — I10 Essential (primary) hypertension: Secondary | ICD-10-CM | POA: Insufficient documentation

## 2024-04-02 HISTORY — PX: CARPAL TUNNEL RELEASE: SHX101

## 2024-04-02 LAB — GLUCOSE, CAPILLARY
Glucose-Capillary: 92 mg/dL (ref 70–99)
Glucose-Capillary: 91 mg/dL (ref 70–99)

## 2024-04-02 SURGERY — CARPAL TUNNEL RELEASE
Anesthesia: Monitor Anesthesia Care | Site: Hand | Laterality: Right

## 2024-04-02 MED ORDER — OXYCODONE HCL 5 MG PO TABS: 5.0000 mg | ORAL_TABLET | Freq: Once | ORAL | Status: DC | PRN

## 2024-04-02 MED ORDER — LACTATED RINGERS IV SOLN: INTRAVENOUS | Status: DC

## 2024-04-02 MED ORDER — MIDAZOLAM HCL 2 MG/2ML IJ SOLN
INTRAMUSCULAR | Status: AC
  Filled 2024-04-02: qty 2

## 2024-04-02 MED ORDER — LIDOCAINE HCL 1 % IJ SOLN: INTRAMUSCULAR | Status: DC | PRN

## 2024-04-02 MED ORDER — SODIUM CHLORIDE 0.9 % IV SOLN
12.5000 mg | INTRAVENOUS | Status: DC | PRN
Start: 2024-04-02 — End: 2024-04-02

## 2024-04-02 MED ORDER — FENTANYL CITRATE (PF) 100 MCG/2ML IJ SOLN
INTRAMUSCULAR | Status: AC
Start: 2024-04-02 — End: 2024-04-02
  Filled 2024-04-02: qty 2

## 2024-04-02 MED ORDER — ONDANSETRON HCL 4 MG/2ML IJ SOLN
INTRAMUSCULAR | Status: DC | PRN
  Administered 2024-04-02: 4 mg via INTRAVENOUS

## 2024-04-02 MED ORDER — PROPOFOL 500 MG/50ML IV EMUL
INTRAVENOUS | Status: DC | PRN
  Administered 2024-04-02: 125 ug/kg/min via INTRAVENOUS

## 2024-04-02 MED ORDER — HYDROMORPHONE HCL 1 MG/ML IJ SOLN: 0.2500 mg | INTRAMUSCULAR | Status: DC | PRN

## 2024-04-02 MED ORDER — OXYCODONE HCL 5 MG/5ML PO SOLN: 5.0000 mg | Freq: Once | ORAL | Status: DC | PRN

## 2024-04-02 MED ORDER — OXYCODONE HCL 5 MG PO TABS: 5.0000 mg | ORAL_TABLET | Freq: Four times a day (QID) | ORAL | 0 refills | Status: AC | PRN

## 2024-04-02 MED ORDER — FENTANYL CITRATE (PF) 100 MCG/2ML IJ SOLN
INTRAMUSCULAR | Status: DC | PRN
  Administered 2024-04-02: 50 ug via INTRAVENOUS

## 2024-04-02 MED ORDER — MIDAZOLAM HCL 2 MG/2ML IJ SOLN
INTRAMUSCULAR | Status: DC | PRN
  Administered 2024-04-02: 2 mg via INTRAVENOUS

## 2024-04-02 MED ORDER — CEFAZOLIN SODIUM-DEXTROSE 2-4 GM/100ML-% IV SOLN
2.0000 g | INTRAVENOUS | Status: AC
  Administered 2024-04-02: 2 g via INTRAVENOUS

## 2024-04-02 MED ORDER — ONDANSETRON HCL 4 MG/2ML IJ SOLN
INTRAMUSCULAR | Status: AC
  Filled 2024-04-02: qty 2

## 2024-04-02 MED ORDER — CEFAZOLIN SODIUM-DEXTROSE 2-4 GM/100ML-% IV SOLN
INTRAVENOUS | Status: AC
  Filled 2024-04-02: qty 100

## 2024-04-02 SURGICAL SUPPLY — 24 items
BLADE SURG 15 STRL LF DISP TIS (BLADE) ×1 IMPLANT
BNDG ELASTIC 3INX 5YD STR LF (GAUZE/BANDAGES/DRESSINGS) ×1 IMPLANT
BNDG ESMARK 4X9 LF (GAUZE/BANDAGES/DRESSINGS) ×1 IMPLANT
BNDG GAUZE DERMACEA FLUFF 4 (GAUZE/BANDAGES/DRESSINGS) ×1 IMPLANT
CHLORAPREP W/TINT 26 (MISCELLANEOUS) ×1 IMPLANT
CORD BIPOLAR FORCEPS 12FT (ELECTRODE) ×1 IMPLANT
COVER BACK TABLE 60X90IN (DRAPES) ×1 IMPLANT
CUFF TOURN SGL QUICK 18X4 (TOURNIQUET CUFF) ×1 IMPLANT
DRAPE EXTREMITY T 121X128X90 (DISPOSABLE) ×1 IMPLANT
DRAPE SURG 17X23 STRL (DRAPES) ×1 IMPLANT
GAUZE XEROFORM 1X8 LF (GAUZE/BANDAGES/DRESSINGS) ×1 IMPLANT
GLOVE BIO SURGEON STRL SZ7 (GLOVE) ×1 IMPLANT
GLOVE BIOGEL PI IND STRL 7.0 (GLOVE) ×1 IMPLANT
GOWN STRL REUS W/ TWL LRG LVL3 (GOWN DISPOSABLE) ×2 IMPLANT
NDL HYPO 25X1 1.5 SAFETY (NEEDLE) ×1 IMPLANT
NEEDLE HYPO 25X1 1.5 SAFETY (NEEDLE) ×1 IMPLANT
NS IRRIG 1000ML POUR BTL (IV SOLUTION) ×1 IMPLANT
PACK BASIN DAY SURGERY FS (CUSTOM PROCEDURE TRAY) ×1 IMPLANT
SHEET MEDIUM DRAPE 40X70 STRL (DRAPES) ×1 IMPLANT
SUT ETHILON 4 0 PS 2 18 (SUTURE) ×1 IMPLANT
SYR BULB EAR ULCER 3OZ GRN STR (SYRINGE) ×1 IMPLANT
SYR CONTROL 10ML LL (SYRINGE) ×1 IMPLANT
TOWEL GREEN STERILE FF (TOWEL DISPOSABLE) ×2 IMPLANT
UNDERPAD 30X36 HEAVY ABSORB (UNDERPADS AND DIAPERS) ×1 IMPLANT

## 2024-04-02 NOTE — Discharge Instructions (Addendum)
 Auburn Blaze, M.D. Hand Surgery  POST-OPERATIVE DISCHARGE INSTRUCTIONS   PRESCRIPTIONS: - You may have been given a prescription to be taken as directed for post-operative pain control.  You may also take over the counter ibuprofen /aleve  and tylenol  for pain. Take this as directed on the packaging. Do not exceed 3000 mg tylenol /acetaminophen  in 24 hours.  Ibuprofen  600-800 mg (3-4) tablets by mouth every 6 hours as needed for pain.   OR  Aleve  2 tablets by mouth every 12 hours (twice daily) as needed for pain.   AND/OR  Tylenol  1000 mg (2 tablets) every 8 hours as needed for pain.  - Please use your pain medication carefully, as refills are limited and you may not be provided with one.  As stated above, please use over the counter pain medicine - it will also be helpful with decreasing your swelling.    ANESTHESIA: -After your surgery, post-surgical discomfort or pain is likely. This discomfort can last several days to a few weeks. At certain times of the day your discomfort may be more intense.   Did you receive a nerve block?   - A nerve block can provide pain relief for one hour to two days after your surgery. As long as the nerve block is working, you will experience little or no sensation in the area the surgeon operated on.  - As the nerve block wears off, you will begin to experience pain or discomfort. It is very important that you begin taking your prescribed pain medication before the nerve block fully wears off. Treating your pain at the first sign of the block wearing off will ensure your pain is better controlled and more tolerable when full-sensation returns. Do not wait until the pain is intolerable, as the medicine will be less effective. It is better to treat pain in advance than to try and catch up.   General Anesthesia:  If you did not receive a nerve block during your surgery, you will need to start taking your pain medication shortly after your surgery and  should continue to do so as prescribed by your surgeon.     ICE AND ELEVATION: - You may use ice for the first 48-72 hours, but it is not critical.   - Motion of your fingers is very important to decrease the swelling.  - Elevation, as much as possible for the next 48 hours, is critical for decreasing swelling as well as for pain relief. Elevation means when you are seated or lying down, you hand should be at or above your heart. When walking, the hand needs to be at or above the level of your elbow.  - If the bandage gets too tight, it may need to be loosened. Please contact our office and we will instruct you in how to do this.    SURGICAL BANDAGES:  - Keep your dressing and/or splint clean and dry at all times.  You can remove your dressing 7 days from now and change with a dry dressing or Band-Aids as needed thereafter. - You may place a plastic bag over your bandage to shower, but be careful, do not get your bandages wet.  - After the bandages have been removed, it is OK to get the stitches wet in a shower or with hand washing. Do Not soak or submerge the wound yet. Please do not use lotions or creams on the stitches.      HAND THERAPY:  - You may not need any. If you  do, we will begin this at your follow up visit in the clinic.    ACTIVITY AND WORK: - You are encouraged to move any fingers which are not in the bandage.  - Light use of the fingers is allowed to assist the other hand with daily hygiene and eating, but strong gripping or lifting is often uncomfortable and should be avoided.  - You might miss a variable period of time from work and hopefully this issue has been discussed prior to surgery. You may not do any heavy work with your affected hand for about 2 weeks.    [facility] Second Floor, [address] [place], [place] [address] [phone]    Post Anesthesia Home Care Instructions  Activity: Get plenty of rest for the remainder of the day. A  responsible individual must stay with you for 24 hours following the procedure.  For the next 24 hours, DO NOT: -Drive a car -Advertising copywriter -Drink alcoholic beverages -Take any medication unless instructed by your physician -Make any legal decisions or sign important papers.  Meals: Start with liquid foods such as gelatin or soup. Progress to regular foods as tolerated. Avoid greasy, spicy, heavy foods. If nausea and/or vomiting occur, drink only clear liquids until the nausea and/or vomiting subsides. Call your physician if vomiting continues.  Special Instructions/Symptoms: Your throat may feel dry or sore from the anesthesia or the breathing tube placed in your throat during surgery. If this causes discomfort, gargle with warm salt water. The discomfort should disappear within 24 hours.     Regional Anesthesia Blocks  1. You may not be able to move or feel the blocked extremity after a regional anesthetic block. This may last may last from 3-48 hours after placement, but it will go away. The length of time depends on the medication injected and your individual response to the medication. As the nerves start to wake up, you may experience tingling as the movement and feeling returns to your extremity. If the numbness and inability to move your extremity has not gone away after 48 hours, please call your surgeon.   2. The extremity that is blocked will need to be protected until the numbness is gone and the strength has returned. Because you cannot feel it, you will need to take extra care to avoid injury. Because it may be weak, you may have difficulty moving it or using it. You may not know what position it is in without looking at it while the block is in effect.  3. For blocks in the legs and feet, returning to weight bearing and walking needs to be done carefully. You will need to wait until the numbness is entirely gone and the strength has returned. You should be able to move your  leg and foot normally before you try and bear weight or walk. You will need someone to be with you when you first try to ensure you do not fall and possibly risk injury.  4. Bruising and tenderness at the needle site are common side effects and will resolve in a few days.  5. Persistent numbness or new problems with movement should be communicated to the surgeon or the [facility] [phone] [facility] [phone]).

## 2024-04-02 NOTE — Anesthesia Preprocedure Evaluation (Signed)
 Anesthesia Evaluation  Patient identified by MRN, date of birth, ID band Patient awake    Reviewed: Allergy & Precautions, NPO status , Patient's Chart, lab work & pertinent test results  Airway Mallampati: III  TM Distance: >3 FB Neck ROM: Full    Dental  (+) Partial Upper, Partial Lower   Pulmonary neg pulmonary ROS   Pulmonary exam normal breath sounds clear to auscultation       Cardiovascular hypertension, Pt. on medications Normal cardiovascular exam Rhythm:Regular Rate:Normal     Neuro/Psych  Headaches   Depression     negative psych ROS   GI/Hepatic Neg liver ROS,GERD  Medicated and Controlled,,  Endo/Other  diabetes, Type 2Hypothyroidism    Renal/GU negative Renal ROS     Musculoskeletal  (+) Arthritis , Osteoarthritis,    Abdominal  (+) + obese  Peds  Hematology negative hematology ROS (+)   Anesthesia Other Findings   Reproductive/Obstetrics                             Anesthesia Physical Anesthesia Plan  ASA: III  Anesthesia Plan: MAC   Post-op Pain Management: Minimal or no pain anticipated   Induction: Intravenous  PONV Risk Score and Plan: 2 and Propofol  infusion and Treatment may vary due to age or medical condition  Airway Management Planned: Simple Face Mask  Additional Equipment:   Intra-op Plan:   Post-operative Plan:   Informed Consent: I have reviewed the patients History and Physical, chart, labs and discussed the procedure including the risks, benefits and alternatives for the proposed anesthesia with the patient or authorized representative who has indicated his/her understanding and acceptance.     Dental advisory given  Plan Discussed with: CRNA  Anesthesia Plan Comments:         Anesthesia Quick Evaluation

## 2024-04-02 NOTE — Transfer of Care (Signed)
 Immediate Anesthesia Transfer of Care Note  Patient: Melanie Roberts  Procedure(s) Performed: CARPAL TUNNEL RELEASE (Right: Hand)  Patient Location: PACU  Anesthesia Type:MAC  Level of Consciousness: awake and alert   Airway & Oxygen Therapy: Patient Spontanous Breathing and Patient connected to face mask oxygen  Post-op Assessment: Report given to RN and Post -op Vital signs reviewed and stable  Post vital signs: Reviewed and stable  Last Vitals:  Vitals Value Taken Time  BP 120/69 04/02/24 1155  Temp    Pulse 71 04/02/24 1156  Resp 16 04/02/24 1156  SpO2 98 % 04/02/24 1156  Vitals shown include unfiled device data.  Last Pain:  Vitals:   04/02/24 1026  PainSc: 0-No pain      Patients Stated Pain Goal: 5 (04/02/24 1026)  Complications: No notable events documented.

## 2024-04-02 NOTE — Interval H&P Note (Signed)
 History and Physical Interval Note:  04/02/2024 10:23 AM  Melanie Roberts  has presented today for surgery, with the diagnosis of Right carpal tunnel syndrome.  The various methods of treatment have been discussed with the patient and family. After consideration of risks, benefits and other options for treatment, the patient has consented to  Procedure(s): CARPAL TUNNEL RELEASE (Right) as a surgical intervention.  The patient's history has been reviewed, patient examined, no change in status, stable for surgery.  I have reviewed the patient's chart and labs.  Questions were answered to the patient's satisfaction.     [name]

## 2024-04-02 NOTE — Anesthesia Postprocedure Evaluation (Signed)
 Anesthesia Post Note  Patient: Melanie Roberts  Procedure(s) Performed: CARPAL TUNNEL RELEASE (Right: Hand)     Patient location during evaluation: PACU Anesthesia Type: MAC Level of consciousness: awake and alert Pain management: pain level controlled Vital Signs Assessment: post-procedure vital signs reviewed and stable Respiratory status: spontaneous breathing, nonlabored ventilation and respiratory function stable Cardiovascular status: blood pressure returned to baseline and stable Postop Assessment: no apparent nausea or vomiting Anesthetic complications: no   No notable events documented.  Last Vitals:  Vitals:   04/02/24 1230 04/02/24 1300  BP: (!) 153/95 (!) 139/95  Pulse: 64 70  Resp: 12 15  Temp:  (!) 36.2 C  SpO2: 95% 97%    Last Pain:  Vitals:   04/02/24 1300  PainSc: 0-No pain                 Earvin Goldberg

## 2024-04-02 NOTE — Op Note (Signed)
   Date of Surgery: 04/02/2024  INDICATIONS: Patient is a 53 y.o.-year-old female with right carpal tunnel syndrome that has failed conservative management.  Risks, benefits, and alternatives to surgery were again discussed with the patient in the preoperative area. The patient wishes to proceed with surgery.  Informed consent was signed after our discussion.   PREOPERATIVE DIAGNOSIS:  Right carpal tunnel syndrome  POSTOPERATIVE DIAGNOSIS: Same.  PROCEDURE:  Right carpal tunnel release   SURGEON: Auburn Blaze, M.D.  ASSIST:   ANESTHESIA:  Local, MAC  IV FLUIDS AND URINE: See anesthesia.  ESTIMATED BLOOD LOSS: 5 mL.  IMPLANTS: * No implants in log *   DRAINS: None  COMPLICATIONS: None noted  DESCRIPTION OF PROCEDURE: The patient was met in the preoperative holding area where the surgical site was marked and the informed consent form was signed.  The patient was then brought back to the operating room and remained on the stretcher.  A hand table was placed adjacent to the operative extremity and locked into place.  A tourniquet was placed on the right forearm.  A formal timeout was performed to confirm that this was the correct patient, surgical side, surgical site, and surgical procedure.  All were present and in agreement. Following formal timeout, a local block was performed using 10 mL of 0.25% plain marcaine  with 1% plain lidocaine  in an equal mixture.  The right upper extremity was then prepped and draped in the usual and sterile fashion.   Following a second timeout, the limb was exsanguinated and the tourniquet inflated to 250 mmHg.  A longitudinal incision was made in line with the radial border of the ring finger from distal to the wrist flexion crease to the intersection of Kaplan's cardinal line.  The skin and subcutaneous tissue was sharply divided.  The longitudinally running palmar fascia was incised.  The thenar musculature was bluntly swept off of the transverse  carpal ligament.  The ligament was divided from proximal to distal until the fat surrounding the palmar arch was encountered. Retractors were then placed in the proximal aspect of the wound to visualize the distal antebrachial fascia.  The fascia was sharply divided under direct visualization.   The wound was then thoroughly irrigated with sterile saline.  The tourniquet was deflated.  Hemostasis was achieved with direct pressure and bipolar electrocautery.  The wound was then closed with 4-0 nylon sutures in a horizontal mattress fashion. The wound was then dressed with xeroform, folded kerlix, and an ace wrap.  The patient was reversed from sedation and the drapes taken down.   All counts were correct x 2 at the end of the procedure.  The patient was then taken to the PACU in stable condition.    POSTOPERATIVE PLAN: She will be discharged to home with appropriate pain medication and discharge instructions.  I'll see her back in 10-14 days for her first postop visit.   Auburn Blaze, MD 11:54 AM

## 2024-04-02 NOTE — H&P (Signed)
 HAND SURGERY   HPI: Patient is a 53 y.o. female who presents with right carpal tunnel syndrome that has failed conservative managment.  Patient denies any changes to their medical history or new systemic symptoms today.    Past Medical History:  Diagnosis Date   Allergy    Diabetes mellitus without complication (HCC)    GERD (gastroesophageal reflux disease)    Headache(784.0)    migraines   Hemorrhoids 12/2012   Hypertension    Hypothyroidism    Seasonal allergies    UTI (urinary tract infection)    Wears partial dentures    upper and lower   Past Surgical History:  Procedure Laterality Date   ADENOIDECTOMY  02/17/2005   CESAREAN SECTION  07/08/2003   ESOPHAGOGASTRODUODENOSCOPY (EGD) WITH PROPOFOL  N/A 06/19/2019   Procedure: ESOPHAGOGASTRODUODENOSCOPY (EGD) WITH PROPOFOL ;  Surgeon: Janel Medford, MD;  Location: [facility];  Service: Gastroenterology;  Laterality: N/A;   EUS N/A 06/19/2019   Procedure: UPPER ENDOSCOPIC ULTRASOUND (EUS) RADIAL;  Surgeon: Janel Medford, MD;  Location: [facility];  Service: Gastroenterology;  Laterality: N/A;   EVALUATION UNDER ANESTHESIA WITH HEMORRHOIDECTOMY N/A 01/14/2013   Procedure: EXAM UNDER ANESTHESIA WITH HEMORRHOIDECTOMY POSSIBLE BANDING;  Surgeon: Evander Hills, DO;  Location: [facility];  Service: General;  Laterality: N/A;   fibroids removed     LAPAROSCOPIC TOTAL HYSTERECTOMY  10/29/2006   MYOMECTOMY  12/18/2001   x 4   NASAL TURBINATE REDUCTION Bilateral 02/07/2005   inferior and removed adenoids   ROTATOR CUFF REPAIR Right 2020   TONSILLECTOMY AND ADENOIDECTOMY  age 2   TUBAL LIGATION  07/08/2003   Social History   Socioeconomic History   Marital status: Divorced    Spouse name: Not on file   Number of children: 3   Years of education: Not on file   Highest education level: Associate degree: occupational, Scientist, product/process development, or vocational program  Occupational History   Occupation: Naval architect     Comment: [employer]  Tobacco Use   Smoking status: Never   Smokeless tobacco: Never  Vaping Use   Vaping status: Never Used  Substance and Sexual Activity   Alcohol use: Yes    Comment: socially   Drug use: No   Sexual activity: Yes  Other Topics Concern   Not on file  Social History Narrative   Not on file   Social Drivers of Health   Financial Resource Strain: High Risk (12/11/2023)   Overall Financial Resource Strain (CARDIA)    Difficulty of Paying Living Expenses: Hard  Food Insecurity: Low Risk  (03/24/2024)   Received from [facility]   Hunger Vital Sign    Worried About Running Out of Food in the Last Year: Never true    Ran Out of Food in the Last Year: Never true  Transportation Needs: No Transportation Needs (03/24/2024)   Received from [facility]    In the past 12 months, has lack of reliable transportation kept you from medical appointments, meetings, work or from getting things needed for daily living? : No  Physical Activity: Sufficiently Active (12/11/2023)   Exercise Vital Sign    Days of Exercise per Week: 6 days    Minutes of Exercise per Session: 30 min  Stress: No Stress Concern Present (12/11/2023)   [employer] of Occupational Health - Occupational Stress Questionnaire    Feeling of Stress : Not at all  Social Connections: Socially Isolated (12/11/2023)   Social Connection and Isolation Panel [NHANES]  Frequency of Communication with Friends and Family: Twice a week    Frequency of Social Gatherings with Friends and Family: Once a week    Attends Religious Services: Never    Database administrator or Organizations: No    Attends Engineer, structural: Not on file    Marital Status: Divorced   Family History  Problem Relation Age of Onset   Breast cancer Mother 76   Diabetes Mother    Fibroids Mother        uterine    Dementia Mother    Pancreatic cancer Father    Cancer Maternal Aunt        Breast   Breast cancer  Maternal Aunt        late 43s   Cancer Maternal Aunt        Liver   Cancer Maternal Aunt        Throat   Heart disease Maternal Uncle        83s   Colon cancer Paternal Uncle    Clotting disorder Maternal Grandmother    Diabetes Maternal Grandmother    Liver cancer Maternal Grandfather    Diabetes Maternal Grandfather    Pancreatic cancer Paternal Grandmother    Diabetes Paternal Grandmother    Stomach cancer Paternal Grandfather    Diabetes Paternal Grandfather    Breast cancer Cousin        early 89s   Esophageal cancer Neg Hx    Sleep apnea Neg Hx    - negative except otherwise stated in the family history section Allergies  Allergen Reactions   Ciprofloxacin Anaphylaxis   Quinolones Anaphylaxis   Bee Venom Swelling and Rash   Prior to Admission medications   Medication Sig Start Date End Date Taking? Authorizing Provider  amlodipine -olmesartan  (AZOR ) 10-20 MG tablet Take 1 tablet by mouth daily. 12/11/23  Yes Graig Lawyer, MD  atorvastatin  (LIPITOR) 20 MG tablet Take 1 tablet (20 mg total) by mouth daily. 07/25/23  Yes Graig Lawyer, MD  cetirizine  (ZYRTEC ) 10 MG tablet Take 1 tablet (10 mg total) by mouth daily. 06/29/21  Yes Webb, Padonda B, FNP  ergocalciferol (VITAMIN D2) 1.25 MG (50000 UT) capsule Take 1 capsule by mouth every 7 (seven) days. 08/27/20  Yes [provider]  estradiol  (ESTRACE ) 0.5 MG tablet Take 1 tablet (0.5 mg total) by mouth daily. 03/10/24  Yes Graig Lawyer, MD  famotidine  (PEPCID ) 20 MG tablet Take 1 tablet (20 mg total) by mouth 2 (two) times daily. 03/02/23  Yes Starlene Eaton, FNP  FLUoxetine  (PROZAC ) 20 MG capsule Take 1 capsule (20 mg total) by mouth daily. 07/24/23  Yes RuddMalcolm Scrivener, MD  fluticasone  (FLONASE ) 50 MCG/ACT nasal spray SHAKE LIQUID AND USE 2 SPRAYS IN EACH NOSTRIL DAILY 04/22/20  Yes Cirigliano, Mary K, DO  furosemide  (LASIX ) 20 MG tablet TAKE 1 TABLET BY MOUTH DAILY AS NEEDED 10/19/20  Yes Cirigliano, Mary K, DO   Levothyroxine  Sodium (TIROSINT ) 200 MCG CAPS Take 200 mcg by mouth daily. 03/10/24  Yes Graig Lawyer, MD  ondansetron  (ZOFRAN ) 4 MG tablet Take 1 tablet (4 mg total) by mouth every 8 (eight) hours as needed for nausea or vomiting. 01/02/24  Yes Raspet, Betsey Brow, PA-C  pantoprazole  (PROTONIX ) 40 MG tablet Take 1 tablet (40 mg total) by mouth 2 (two) times daily. 03/10/24  Yes Graig Lawyer, MD  TIROSINT  75 MCG CAPS Take 1 capsule (75 mcg total) by mouth daily. 03/10/24  Yes Graig Lawyer, MD  albuterol  (VENTOLIN  HFA) 108 (90 Base) MCG/ACT inhaler Inhale 1-2 puffs into the lungs every 6 (six) hours as needed for wheezing or shortness of breath. 12/02/22   Dodson Freestone, FNP  beclomethasone (QVAR  REDIHALER) 80 MCG/ACT inhaler INHALE 1 PUFF INTO THE LUNGS 2 TIMES DAILY 09/13/23   Gavin Kast, FNP  cyclobenzaprine  (FLEXERIL ) 10 MG tablet Take 1 tablet (10 mg total) by mouth 2 (two) times daily as needed for muscle spasms. 08/06/23   Denese Finn, PA-C  diclofenac  (VOLTAREN ) 75 MG EC tablet Take 1 tablet (75 mg total) by mouth 2 (two) times daily. 01/02/24   Raspet, Erin K, PA-C  EPINEPHrine  (EPIPEN  2-PAK) 0.3 mg/0.3 mL IJ SOAJ injection Inject 0.3 mLs (0.3 mg total) into the muscle as needed (allergic reaction). 05/15/16   Whiteheart, Lemon Qua, NP  Lancets (ONETOUCH DELICA PLUS LANCET30G) MISC daily. as directed 08/03/21   [provider]  metFORMIN (GLUCOPHAGE-XR) 500 MG 24 hr tablet Take by mouth. 08/16/21   [provider]  [facility] VERIO test strip daily. 08/03/21   [provider]  Semaglutide , 1 MG/DOSE, 4 MG/3ML SOPN Inject 1 mg as directed once a week. 03/10/24   Graig Lawyer, MD   No results found. - Positive ROS: All other systems have been reviewed and were otherwise negative with the exception of those mentioned in the HPI and as above.  Physical Exam: General: No acute distress, resting comfortably Cardiovascular: BUE warm and well perfused, normal  rate Respiratory: Normal WOB on RA Skin: Warm and dry Neurologic: Sensation intact distally Psychiatric: Patient is at baseline mood and affect  Right Upper Extremity  Mild swelling of her fingers and thumb at the [facility] joint. She has minimal pain without crepitus at the base of the thumb at the [facility] joint but with mild tenderness to pain over the radial styloid and first dorsal compartment. She has a positive Tinel, Phalen, Durkan signs with symptoms radiating into her middle and ring fingers. She has intact thenar motor strength and FDI strength. She has full active range of motion of her fingers. Sensation is overall intact light touch in the median, ulnar, and radial nerve distributions. Her hand is warm and well-perfused with brisk capillary refill.     Assessment: 53 yo F with R CTS that has failed conservative management.  Plan: OR today for R CTR. We again reviewed the risks of surgery which include bleeding, infection, damage to neurovascular structures including the median nerve or it's branches, persistent symptoms, scar sensitivity or pillar pain, need for additional surgery.  Informed consent was signed.  All questions were answered.   Marilyn Shropshire, M.D. [facility] 10:21 AM

## 2024-04-03 ENCOUNTER — Encounter (HOSPITAL_BASED_OUTPATIENT_CLINIC_OR_DEPARTMENT_OTHER): Payer: Self-pay | Admitting: Orthopedic Surgery

## 2024-04-24 ENCOUNTER — Telehealth: Payer: Self-pay | Admitting: Family Medicine

## 2024-04-24 NOTE — Telephone Encounter (Signed)
 Patient dropped off document fmla request, to be filled out by provider. Patient requested to send it back via Call Patient to pick up within ASAP. Document is located in providers tray at front office.Please advise at [facility] [phone]

## 2024-04-24 NOTE — Telephone Encounter (Signed)
 Form on my desk and will address this on Monday 04/28/24.  Dm/cma

## 2024-04-28 NOTE — Telephone Encounter (Signed)
 Patient will come pick up the OV notes and FMLA form from 07/2023. Dm/cma

## 2024-04-28 NOTE — Telephone Encounter (Signed)
 Spoke to patient. She had surgery on the Rt wrist 04/02/24, she went back to work on 15 th -23 rd,.  Dr Romona took her out of work on 04/14/24 and is out till her next appointment on 05/13/24.    Printed the last 2 office notes and FMLA form out to be given to patient so she can submit them to her work.

## 2024-05-13 DIAGNOSIS — G5601 Carpal tunnel syndrome, right upper limb: Secondary | ICD-10-CM | POA: Diagnosis not present

## 2024-06-06 ENCOUNTER — Other Ambulatory Visit: Payer: Self-pay | Admitting: Family Medicine

## 2024-06-06 DIAGNOSIS — E7800 Pure hypercholesterolemia, unspecified: Secondary | ICD-10-CM

## 2024-06-10 ENCOUNTER — Ambulatory Visit: Admitting: Family Medicine

## 2024-08-19 ENCOUNTER — Other Ambulatory Visit: Payer: Self-pay

## 2024-08-19 ENCOUNTER — Emergency Department (HOSPITAL_BASED_OUTPATIENT_CLINIC_OR_DEPARTMENT_OTHER)
Admission: EM | Admit: 2024-08-19 | Discharge: 2024-08-19 | Disposition: A | Discharge status: Home / Self Care | Attending: Emergency Medicine | Admitting: Emergency Medicine

## 2024-08-19 ENCOUNTER — Encounter (HOSPITAL_BASED_OUTPATIENT_CLINIC_OR_DEPARTMENT_OTHER): Payer: Self-pay

## 2024-08-19 DIAGNOSIS — R059 Cough, unspecified: Secondary | ICD-10-CM | POA: Diagnosis not present

## 2024-08-19 DIAGNOSIS — J069 Acute upper respiratory infection, unspecified: Secondary | ICD-10-CM | POA: Insufficient documentation

## 2024-08-19 LAB — RESP PANEL BY RT-PCR (RSV, FLU A&B, COVID)  RVPGX2
Influenza A by PCR: NEGATIVE
Influenza B by PCR: NEGATIVE
Resp Syncytial Virus by PCR: NEGATIVE
SARS Coronavirus 2 by RT PCR: NEGATIVE

## 2024-08-19 MED ORDER — PREDNISONE 10 MG PO TABS: 20.0000 mg | ORAL_TABLET | Freq: Two times a day (BID) | ORAL | 0 refills | Status: AC

## 2024-08-19 MED ORDER — DOXYCYCLINE HYCLATE 100 MG PO CAPS: 100.0000 mg | ORAL_CAPSULE | Freq: Two times a day (BID) | ORAL | 0 refills | Status: DC

## 2024-08-19 MED ORDER — DOXYCYCLINE HYCLATE 100 MG PO TABS
100.0000 mg | ORAL_TABLET | Freq: Once | ORAL | Status: AC
Start: 2024-08-19 — End: 2024-08-19
  Administered 2024-08-19: 100 mg via ORAL
  Filled 2024-08-19: qty 1

## 2024-08-19 MED ORDER — PREDNISONE 20 MG PO TABS
40.0000 mg | ORAL_TABLET | Freq: Once | ORAL | Status: AC
  Administered 2024-08-19: 40 mg via ORAL
  Filled 2024-08-19: qty 2

## 2024-08-19 NOTE — ED Provider Notes (Signed)
 [place] EMERGENCY DEPARTMENT AT [facility] Provider Note   CSN: [id number] Arrival date & time: 08/19/24  [id number]     Patient presents with: No chief complaint on file.   Melanie Roberts is a 53 y.o. female.   Patient is a 53 year old female presenting with complaints of cough persistent for the past 2 weeks.  She is now experiencing sore throat.  She denies fevers or chills.  Cough was initially productive, but is now a dry cough.  She has tried over-the-counter medications with little relief.       Prior to Admission medications   Medication Sig Start Date End Date Taking? Authorizing Provider  albuterol  (VENTOLIN  HFA) 108 (90 Base) MCG/ACT inhaler Inhale 1-2 puffs into the lungs every 6 (six) hours as needed for wheezing or shortness of breath. 12/02/22   Mound, Haley E, FNP  amlodipine -olmesartan  (AZOR ) 10-20 MG tablet Take 1 tablet by mouth daily. 12/11/23   Thedora Garnette HERO, MD  atorvastatin  (LIPITOR) 20 MG tablet TAKE 1 TABLET(20 MG) BY MOUTH DAILY 06/06/24   Thedora Garnette HERO, MD  beclomethasone (QVAR  REDIHALER) 80 MCG/ACT inhaler INHALE 1 PUFF INTO THE LUNGS 2 TIMES DAILY 09/13/23   Billy Knee, FNP  cetirizine  (ZYRTEC ) 10 MG tablet Take 1 tablet (10 mg total) by mouth daily. 06/29/21   Webb, Padonda B, FNP  cyclobenzaprine  (FLEXERIL ) 10 MG tablet Take 1 tablet (10 mg total) by mouth 2 (two) times daily as needed for muscle spasms. 08/06/23   Victor Lynwood DASEN, PA-C  diclofenac  (VOLTAREN ) 75 MG EC tablet Take 1 tablet (75 mg total) by mouth 2 (two) times daily. 01/02/24   Raspet, Erin K, PA-C  EPINEPHrine  (EPIPEN  2-PAK) 0.3 mg/0.3 mL IJ SOAJ injection Inject 0.3 mLs (0.3 mg total) into the muscle as needed (allergic reaction). 05/15/16   Whiteheart, Lamarr LABOR, NP  ergocalciferol (VITAMIN D2) 1.25 MG (50000 UT) capsule Take 1 capsule by mouth every 7 (seven) days. 08/27/20   [provider]  estradiol  (ESTRACE ) 0.5 MG tablet Take 1 tablet (0.5 mg total) by  mouth daily. 03/10/24   Thedora Garnette HERO, MD  famotidine  (PEPCID ) 20 MG tablet Take 1 tablet (20 mg total) by mouth 2 (two) times daily. 03/02/23   Enedelia Dorna HERO, FNP  FLUoxetine  (PROZAC ) 20 MG capsule Take 1 capsule (20 mg total) by mouth daily. 07/24/23   Thedora Garnette HERO, MD  fluticasone  (FLONASE ) 50 MCG/ACT nasal spray SHAKE LIQUID AND USE 2 SPRAYS IN EACH NOSTRIL DAILY 04/22/20   Cirigliano, Mary K, DO  furosemide  (LASIX ) 20 MG tablet TAKE 1 TABLET BY MOUTH DAILY AS NEEDED 10/19/20   Cirigliano, Ronal POUR, DO  Lancets (ONETOUCH DELICA PLUS [place]) MISC daily. as directed 08/03/21   [provider]  Levothyroxine  Sodium (TIROSINT ) 200 MCG CAPS Take 200 mcg by mouth daily. 03/10/24   Thedora Garnette HERO, MD  metFORMIN (GLUCOPHAGE-XR) 500 MG 24 hr tablet Take by mouth. 08/16/21   [provider]  ondansetron  (ZOFRAN ) 4 MG tablet Take 1 tablet (4 mg total) by mouth every 8 (eight) hours as needed for nausea or vomiting. 01/02/24   Raspet, Rocky POUR, PA-C  ONETOUCH VERIO test strip daily. 08/03/21   [provider]  pantoprazole  (PROTONIX ) 40 MG tablet Take 1 tablet (40 mg total) by mouth 2 (two) times daily. 03/10/24   Thedora Garnette HERO, MD  Semaglutide , 1 MG/DOSE, 4 MG/3ML SOPN Inject 1 mg as directed once a week. 03/10/24   Thedora Garnette HERO, MD  TIROSINT  75 MCG CAPS Take 1 capsule (75 mcg total) by mouth daily. 03/10/24   Thedora Garnette HERO, MD    Allergies: Ciprofloxacin, Quinolones, and Bee venom    Review of Systems  All other systems reviewed and are negative.   Updated Vital Signs BP 124/82   Pulse 79   Temp 98.1 F (36.7 C)   Resp 18   SpO2 97%   Physical Exam Vitals and nursing note reviewed.  Constitutional:      General: She is not in acute distress.    Appearance: She is well-developed. She is not diaphoretic.  HENT:     Head: Normocephalic and atraumatic.     Mouth/Throat:     Mouth: Mucous membranes are moist.     Pharynx: Posterior oropharyngeal  erythema present. No oropharyngeal exudate.  Cardiovascular:     Rate and Rhythm: Normal rate and regular rhythm.     Heart sounds: No murmur heard.    No friction rub. No gallop.  Pulmonary:     Effort: Pulmonary effort is normal. No respiratory distress.     Breath sounds: Normal breath sounds. No wheezing.  Abdominal:     General: Bowel sounds are normal. There is no distension.     Palpations: Abdomen is soft.     Tenderness: There is no abdominal tenderness.  Musculoskeletal:        General: Normal range of motion.     Cervical back: Normal range of motion and neck supple.  Skin:    General: Skin is warm and dry.  Neurological:     General: No focal deficit present.     Mental Status: She is alert and oriented to person, place, and time.     (all labs ordered are listed, but only abnormal results are displayed) Labs Reviewed  RESP PANEL BY RT-PCR (RSV, FLU A&B, COVID)  [id number]    EKG: None  Radiology: No results found.   Procedures   Medications Ordered in the ED  doxycycline (VIBRA-TABS) tablet 100 mg (has no administration in time range)  predniSONE  (DELTASONE ) tablet 40 mg (has no administration in time range)                                    Medical Decision Making Risk Prescription drug management.   Patient is a 53 year old female presenting with complaints of cough and sore throat as described in the HPI.  She arrives here with stable vital signs and is afebrile.  There is evidence on exam for erythema, but no exudates or peritonsillar abscess.  COVID/flu/RSV all negative.  As the patient has had persistent symptoms that are worsening for the past 2 weeks, I feel as though it is reasonable to prescribe antibiotics and steroids.  She is to follow-up as needed if not improving.     Final diagnoses:  None    ED Discharge Orders     None          Geroldine Berg, MD 08/19/24 [phone]

## 2024-08-19 NOTE — ED Triage Notes (Signed)
 Cough x1 week. Tonight has a sore throat. Denies fever, SOB, CP.

## 2024-08-19 NOTE — Discharge Instructions (Addendum)
 Begin taking doxycycline as prescribed.  Begin taking prednisone  as prescribed.  You can continue over-the-counter medications as needed for cough.  Follow-up with primary doctor if not improving in the next 4 to 5 days.

## 2024-09-15 ENCOUNTER — Telehealth: Payer: Self-pay | Admitting: Family Medicine

## 2024-09-15 NOTE — Telephone Encounter (Signed)
 error

## 2024-09-16 ENCOUNTER — Telehealth: Payer: Self-pay | Admitting: Family Medicine

## 2024-09-16 NOTE — Telephone Encounter (Signed)
 Left VM to rtn call with questions on letter that was dropped off. Dm/cma

## 2024-09-16 NOTE — Telephone Encounter (Signed)
 Patient dropped off document Worker's compensation?, to be filled out by provider. Patient requested to send it back via Call Patient to pick up within 2-days. Document is located in providers tray at front office.Please advise at [facility] [phone]

## 2024-09-17 NOTE — Telephone Encounter (Signed)
 Spoke to patient, printed last 2 office visit notes out and will wait for provider to return on Monday 09/22/24 for him to see if he wants to add anything else for patient to pick up.  Dm/cma

## 2024-09-22 DIAGNOSIS — E041 Nontoxic single thyroid nodule: Secondary | ICD-10-CM | POA: Diagnosis not present

## 2024-09-22 DIAGNOSIS — E063 Autoimmune thyroiditis: Secondary | ICD-10-CM | POA: Diagnosis not present

## 2024-09-22 NOTE — Telephone Encounter (Signed)
 Provider reviewed letter advised to give her the last 2 office visit notes.   Called and let her know they are ready and she is coming to pick them up.  Dm/cma

## 2024-09-22 NOTE — Telephone Encounter (Signed)
 Placed on providers desk to review. Dm/cma

## 2024-09-23 DIAGNOSIS — M65311 Trigger thumb, right thumb: Secondary | ICD-10-CM | POA: Diagnosis not present

## 2024-09-23 DIAGNOSIS — E063 Autoimmune thyroiditis: Secondary | ICD-10-CM | POA: Diagnosis not present

## 2024-09-23 DIAGNOSIS — E1165 Type 2 diabetes mellitus with hyperglycemia: Secondary | ICD-10-CM | POA: Diagnosis not present

## 2024-11-06 ENCOUNTER — Ambulatory Visit (HOSPITAL_BASED_OUTPATIENT_CLINIC_OR_DEPARTMENT_OTHER)
Admission: EM | Admit: 2024-11-06 | Discharge: 2024-11-07 | Disposition: A | Discharge status: Home / Self Care | Attending: Emergency Medicine | Admitting: Emergency Medicine

## 2024-11-06 ENCOUNTER — Other Ambulatory Visit: Payer: Self-pay

## 2024-11-06 DIAGNOSIS — K449 Diaphragmatic hernia without obstruction or gangrene: Secondary | ICD-10-CM | POA: Insufficient documentation

## 2024-11-06 DIAGNOSIS — I10 Essential (primary) hypertension: Secondary | ICD-10-CM | POA: Diagnosis not present

## 2024-11-06 DIAGNOSIS — R1012 Left upper quadrant pain: Secondary | ICD-10-CM | POA: Diagnosis present

## 2024-11-06 DIAGNOSIS — K3580 Unspecified acute appendicitis: Secondary | ICD-10-CM | POA: Diagnosis not present

## 2024-11-06 DIAGNOSIS — E119 Type 2 diabetes mellitus without complications: Secondary | ICD-10-CM | POA: Diagnosis not present

## 2024-11-06 DIAGNOSIS — R109 Unspecified abdominal pain: Secondary | ICD-10-CM

## 2024-11-06 NOTE — ED Triage Notes (Signed)
 Pt POV from home c/o LUQ abd pain x 1 day, described as bloating/sharp stabbing pain. Denies NVD. Denies CP/SOB.

## 2024-11-07 ENCOUNTER — Emergency Department (HOSPITAL_COMMUNITY): Admitting: Certified Registered Nurse Anesthetist

## 2024-11-07 ENCOUNTER — Encounter (HOSPITAL_COMMUNITY)
Admission: EM | Disposition: A | Discharge status: Home / Self Care | Payer: Self-pay | Source: Home / Self Care | Attending: Emergency Medicine

## 2024-11-07 ENCOUNTER — Emergency Department (HOSPITAL_BASED_OUTPATIENT_CLINIC_OR_DEPARTMENT_OTHER)

## 2024-11-07 ENCOUNTER — Other Ambulatory Visit (HOSPITAL_COMMUNITY): Payer: Self-pay

## 2024-11-07 ENCOUNTER — Encounter (HOSPITAL_BASED_OUTPATIENT_CLINIC_OR_DEPARTMENT_OTHER): Payer: Self-pay

## 2024-11-07 DIAGNOSIS — K3580 Unspecified acute appendicitis: Secondary | ICD-10-CM | POA: Diagnosis present

## 2024-11-07 DIAGNOSIS — R1012 Left upper quadrant pain: Secondary | ICD-10-CM | POA: Diagnosis not present

## 2024-11-07 DIAGNOSIS — R109 Unspecified abdominal pain: Secondary | ICD-10-CM | POA: Diagnosis present

## 2024-11-07 DIAGNOSIS — Z743 Need for continuous supervision: Secondary | ICD-10-CM | POA: Diagnosis not present

## 2024-11-07 HISTORY — PX: LAPAROSCOPIC APPENDECTOMY: SHX408

## 2024-11-07 LAB — COMPREHENSIVE METABOLIC PANEL WITH GFR
ALT: 11 U/L (ref 0–44)
AST: 13 U/L — ABNORMAL LOW (ref 15–41)
Albumin: 4.6 g/dL (ref 3.5–5.0)
Alkaline Phosphatase: 49 U/L (ref 38–126)
Anion gap: 13 (ref 5–15)
BUN: 11 mg/dL (ref 6–20)
CO2: 25 mmol/L (ref 22–32)
Calcium: 10.5 mg/dL — ABNORMAL HIGH (ref 8.9–10.3)
Chloride: 100 mmol/L (ref 98–111)
Creatinine, Ser: 0.71 mg/dL (ref 0.44–1.00)
GFR, Estimated: >60 mL/min
Glucose, Bld: 117 mg/dL — ABNORMAL HIGH (ref 70–99)
Potassium: 3.9 mmol/L (ref 3.5–5.1)
Sodium: 138 mmol/L (ref 135–145)
Total Bilirubin: 0.7 mg/dL (ref 0.0–1.2)
Total Protein: 7.7 g/dL (ref 6.5–8.1)

## 2024-11-07 LAB — CBC
HCT: 37.7 % (ref 36.0–46.0)
Hemoglobin: 12.5 g/dL (ref 12.0–15.0)
MCH: 28.3 pg (ref 26.0–34.0)
MCHC: 33.2 g/dL (ref 30.0–36.0)
MCV: 85.5 fL (ref 80.0–100.0)
Platelets: 313 K/uL (ref 150–400)
RBC: 4.41 MIL/uL (ref 3.87–5.11)
RDW: 14.2 % (ref 11.5–15.5)
WBC: 9.9 K/uL (ref 4.0–10.5)
nRBC: 0 % (ref 0.0–0.2)

## 2024-11-07 LAB — LIPASE, BLOOD: Lipase: 26 U/L (ref 11–51)

## 2024-11-07 LAB — GLUCOSE, CAPILLARY
Glucose-Capillary: 99 mg/dL (ref 70–99)
Glucose-Capillary: 122 mg/dL — ABNORMAL HIGH (ref 70–99)

## 2024-11-07 LAB — URINALYSIS, ROUTINE W REFLEX MICROSCOPIC
Bilirubin Urine: NEGATIVE
Glucose, UA: NEGATIVE mg/dL
Hgb urine dipstick: NEGATIVE
Ketones, ur: NEGATIVE mg/dL
Leukocytes,Ua: NEGATIVE
Nitrite: NEGATIVE
Protein, ur: NEGATIVE mg/dL
Specific Gravity, Urine: 1.024 (ref 1.005–1.030)
pH: 6 (ref 5.0–8.0)

## 2024-11-07 MED ORDER — CHLORHEXIDINE GLUCONATE 0.12 % MT SOLN
15.0000 mL | Freq: Once | OROMUCOSAL | Status: AC
  Administered 2024-11-07: 15 mL via OROMUCOSAL
  Filled 2024-11-07: qty 15

## 2024-11-07 MED ORDER — IBUPROFEN 200 MG PO TABS
600.0000 mg | ORAL_TABLET | Freq: Four times a day (QID) | ORAL | 0 refills | Status: AC
  Filled 2024-11-07: qty 75, 7d supply, fill #0

## 2024-11-07 MED ORDER — ONDANSETRON HCL 4 MG/2ML IJ SOLN
INTRAMUSCULAR | Status: AC
  Filled 2024-11-07: qty 2

## 2024-11-07 MED ORDER — PROPOFOL 10 MG/ML IV BOLUS
INTRAVENOUS | Status: AC
  Filled 2024-11-07: qty 20

## 2024-11-07 MED ORDER — OXYCODONE HCL 5 MG PO TABS
5.0000 mg | ORAL_TABLET | Freq: Three times a day (TID) | ORAL | 0 refills | Status: AC | PRN
  Filled 2024-11-07: qty 12, 4d supply, fill #0

## 2024-11-07 MED ORDER — ORAL CARE MOUTH RINSE: 15.0000 mL | Freq: Once | OROMUCOSAL | Status: AC

## 2024-11-07 MED ORDER — DEXAMETHASONE SOD PHOSPHATE PF 10 MG/ML IJ SOLN
INTRAMUSCULAR | Status: DC | PRN
  Administered 2024-11-07: 10 mg via INTRAVENOUS

## 2024-11-07 MED ORDER — ACETAMINOPHEN 500 MG PO TABS
1000.0000 mg | ORAL_TABLET | Freq: Once | ORAL | Status: AC
  Administered 2024-11-07: 1000 mg via ORAL
  Filled 2024-11-07: qty 2

## 2024-11-07 MED ORDER — SCOPOLAMINE 1 MG/3DAYS TD PT72
1.0000 | MEDICATED_PATCH | TRANSDERMAL | Status: DC
  Administered 2024-11-07: 1 mg via TRANSDERMAL
  Filled 2024-11-07: qty 1

## 2024-11-07 MED ORDER — LIDOCAINE 2% (20 MG/ML) 5 ML SYRINGE
INTRAMUSCULAR | Status: DC | PRN
  Administered 2024-11-07: 60 mg via INTRAVENOUS

## 2024-11-07 MED ORDER — HYDROMORPHONE HCL 1 MG/ML IJ SOLN: 0.2500 mg | INTRAMUSCULAR | Status: DC | PRN

## 2024-11-07 MED ORDER — OXYCODONE HCL 5 MG PO TABS: 5.0000 mg | ORAL_TABLET | Freq: Once | ORAL | Status: DC | PRN

## 2024-11-07 MED ORDER — SUGAMMADEX SODIUM 200 MG/2ML IV SOLN
INTRAVENOUS | Status: DC | PRN
  Administered 2024-11-07: 200 mg via INTRAVENOUS

## 2024-11-07 MED ORDER — PHENYLEPHRINE 80 MCG/ML (10ML) SYRINGE FOR IV PUSH (FOR BLOOD PRESSURE SUPPORT)
PREFILLED_SYRINGE | INTRAVENOUS | Status: AC
  Filled 2024-11-07: qty 20

## 2024-11-07 MED ORDER — PHENYLEPHRINE 80 MCG/ML (10ML) SYRINGE FOR IV PUSH (FOR BLOOD PRESSURE SUPPORT)
PREFILLED_SYRINGE | INTRAVENOUS | Status: DC | PRN
  Administered 2024-11-07: 160 ug via INTRAVENOUS
  Administered 2024-11-07: 80 ug via INTRAVENOUS

## 2024-11-07 MED ORDER — EPHEDRINE SULFATE-NACL 50-0.9 MG/10ML-% IV SOSY
PREFILLED_SYRINGE | INTRAVENOUS | Status: DC | PRN
  Administered 2024-11-07: 10 mg via INTRAVENOUS

## 2024-11-07 MED ORDER — MIDAZOLAM HCL 2 MG/2ML IJ SOLN
INTRAMUSCULAR | Status: AC
  Filled 2024-11-07: qty 2

## 2024-11-07 MED ORDER — MEPERIDINE HCL 25 MG/ML IJ SOLN: 6.2500 mg | INTRAMUSCULAR | Status: DC | PRN

## 2024-11-07 MED ORDER — OXYCODONE HCL 5 MG/5ML PO SOLN: 5.0000 mg | Freq: Once | ORAL | Status: DC | PRN

## 2024-11-07 MED ORDER — DICYCLOMINE HCL 10 MG PO CAPS
20.0000 mg | ORAL_CAPSULE | Freq: Once | ORAL | Status: AC
  Administered 2024-11-07: 20 mg via ORAL
  Filled 2024-11-07: qty 2

## 2024-11-07 MED ORDER — PROPOFOL 10 MG/ML IV BOLUS
INTRAVENOUS | Status: DC | PRN
  Administered 2024-11-07: 200 mg via INTRAVENOUS

## 2024-11-07 MED ORDER — SODIUM CHLORIDE 0.9 % IV SOLN
2.0000 g | Freq: Once | INTRAVENOUS | Status: AC
  Administered 2024-11-07: 2 g via INTRAVENOUS
  Filled 2024-11-07: qty 20

## 2024-11-07 MED ORDER — MORPHINE SULFATE (PF) 4 MG/ML IV SOLN
4.0000 mg | INTRAVENOUS | Status: DC | PRN
  Administered 2024-11-07 (×3): 4 mg via INTRAVENOUS
  Filled 2024-11-07 (×3): qty 1

## 2024-11-07 MED ORDER — ROCURONIUM BROMIDE 10 MG/ML (PF) SYRINGE
PREFILLED_SYRINGE | INTRAVENOUS | Status: DC | PRN
  Administered 2024-11-07: 70 mg via INTRAVENOUS

## 2024-11-07 MED ORDER — METRONIDAZOLE 500 MG/100ML IV SOLN
500.0000 mg | Freq: Once | INTRAVENOUS | Status: AC
  Administered 2024-11-07: 500 mg via INTRAVENOUS
  Filled 2024-11-07: qty 100

## 2024-11-07 MED ORDER — IOHEXOL 300 MG/ML  SOLN
100.0000 mL | Freq: Once | INTRAMUSCULAR | Status: AC | PRN
  Administered 2024-11-07: 100 mL via INTRAVENOUS

## 2024-11-07 MED ORDER — KETOROLAC TROMETHAMINE 30 MG/ML IJ SOLN
INTRAMUSCULAR | Status: AC
  Filled 2024-11-07: qty 1

## 2024-11-07 MED ORDER — KETOROLAC TROMETHAMINE 30 MG/ML IJ SOLN: 30.0000 mg | Freq: Once | INTRAMUSCULAR | Status: DC | PRN

## 2024-11-07 MED ORDER — MIDAZOLAM HCL (PF) 2 MG/2ML IJ SOLN
INTRAMUSCULAR | Status: DC | PRN
  Administered 2024-11-07: 2 mg via INTRAVENOUS

## 2024-11-07 MED ORDER — ACETAMINOPHEN 325 MG PO TABS
650.0000 mg | ORAL_TABLET | Freq: Four times a day (QID) | ORAL | 0 refills | Status: AC
  Filled 2024-11-07: qty 50, 7d supply, fill #0

## 2024-11-07 MED ORDER — ONDANSETRON HCL 4 MG/2ML IJ SOLN: 4.0000 mg | Freq: Once | INTRAMUSCULAR | Status: DC | PRN

## 2024-11-07 MED ORDER — FENTANYL CITRATE (PF) 250 MCG/5ML IJ SOLN
INTRAMUSCULAR | Status: AC
  Filled 2024-11-07: qty 5

## 2024-11-07 MED ORDER — PROPOFOL 500 MG/50ML IV EMUL
INTRAVENOUS | Status: DC | PRN
  Administered 2024-11-07: 50 ug/kg/min via INTRAVENOUS

## 2024-11-07 MED ORDER — PANTOPRAZOLE SODIUM 40 MG IV SOLR
40.0000 mg | Freq: Once | INTRAVENOUS | Status: AC
  Administered 2024-11-07: 40 mg via INTRAVENOUS
  Filled 2024-11-07: qty 10

## 2024-11-07 MED ORDER — INSULIN ASPART 100 UNIT/ML IJ SOLN: 0.0000 [IU] | INTRAMUSCULAR | Status: DC | PRN

## 2024-11-07 MED ORDER — LACTATED RINGERS IV BOLUS
1000.0000 mL | Freq: Once | INTRAVENOUS | Status: AC
  Administered 2024-11-07: 1000 mL via INTRAVENOUS

## 2024-11-07 MED ORDER — PHENYLEPHRINE HCL-NACL 20-0.9 MG/250ML-% IV SOLN
INTRAVENOUS | Status: DC | PRN
  Administered 2024-11-07: 25 ug/min via INTRAVENOUS

## 2024-11-07 MED ORDER — ONDANSETRON HCL 4 MG/2ML IJ SOLN
INTRAMUSCULAR | Status: DC | PRN
  Administered 2024-11-07: 4 mg via INTRAVENOUS

## 2024-11-07 MED ORDER — EPHEDRINE 5 MG/ML INJ
INTRAVENOUS | Status: AC
  Filled 2024-11-07: qty 5

## 2024-11-07 MED ORDER — LACTATED RINGERS IV SOLN: INTRAVENOUS | Status: DC

## 2024-11-07 MED ORDER — FENTANYL CITRATE (PF) 250 MCG/5ML IJ SOLN
INTRAMUSCULAR | Status: DC | PRN
  Administered 2024-11-07 (×3): 50 ug via INTRAVENOUS

## 2024-11-07 MED ORDER — AMISULPRIDE (ANTIEMETIC) 5 MG/2ML IV SOLN: 10.0000 mg | Freq: Once | INTRAVENOUS | Status: DC | PRN

## 2024-11-07 NOTE — ED Provider Notes (Signed)
 " [place] EMERGENCY DEPARTMENT AT [facility] Provider Note   CSN: [id number] Arrival date & time: 11/06/24  2350     History Chief Complaint  Patient presents with   Abdominal Pain    HPI Melanie Roberts is a 54 y.o. female presenting for chief complaint of abdominal pain.  She is a otherwise fairly healthy 54 year old female.  History of hypertension, diabetes.  States that over the last 48 hours she has had severe left upper quadrant pain.  States states she tried to tolerate at home but pain became more acute over the past 48 hours.  Denies nausea or vomiting but endorses fevers and chills.  No sick contacts at home.  No recent other illnesses.  No history of similar.  No surgical abdominal history..   Patient's recorded medical, surgical, social, medication list and allergies were reviewed in the Snapshot window as part of the initial history.   Review of Systems   Review of Systems  Constitutional:  Negative for chills and fever.  HENT:  Negative for ear pain and sore throat.   Eyes:  Negative for pain and visual disturbance.  Respiratory:  Negative for cough and shortness of breath.   Cardiovascular:  Negative for chest pain and palpitations.  Gastrointestinal:  Positive for abdominal distention and abdominal pain. Negative for vomiting.  Genitourinary:  Negative for dysuria and hematuria.  Musculoskeletal:  Negative for arthralgias and back pain.  Skin:  Negative for color change and rash.  Neurological:  Negative for seizures and syncope.  All other systems reviewed and are negative.   Physical Exam Updated Vital Signs BP 117/86   Pulse (!) 106   Temp 99.7 F (37.6 C) (Oral)   Resp 19   Ht 5' 7 (1.702 m)   Wt 98.4 kg   SpO2 96%   BMI 33.99 kg/m  Physical Exam Vitals and nursing note reviewed.  Constitutional:      General: She is not in acute distress.    Appearance: She is well-developed.  HENT:     Head: Normocephalic and atraumatic.   Eyes:     Conjunctiva/sclera: Conjunctivae normal.  Cardiovascular:     Rate and Rhythm: Normal rate and regular rhythm.     Heart sounds: No murmur heard. Pulmonary:     Effort: Pulmonary effort is normal. No respiratory distress.     Breath sounds: Normal breath sounds.  Abdominal:     Palpations: Abdomen is soft.     Tenderness: There is no abdominal tenderness. There is no guarding.  Musculoskeletal:        General: No swelling.     Cervical back: Neck supple.  Skin:    General: Skin is warm and dry.     Capillary Refill: Capillary refill takes less than 2 seconds.  Neurological:     Mental Status: She is alert.  Psychiatric:        Mood and Affect: Mood normal.      ED Course/ Medical Decision Making/ A&P    Procedures Procedures   Medications Ordered in ED Medications  cefTRIAXone  (ROCEPHIN ) 2 g in sodium chloride  0.9 % 100 mL IVPB (has no administration in time range)  metroNIDAZOLE  (FLAGYL ) IVPB 500 mg (has no administration in time range)  lactated ringers  bolus 1,000 mL (has no administration in time range)  morphine  (PF) 4 MG/ML injection 4 mg (has no administration in time range)  pantoprazole  (PROTONIX ) injection 40 mg (40 mg Intravenous Given 11/07/24 0020)  acetaminophen  (TYLENOL )  tablet 1,000 mg (1,000 mg Oral Given 11/07/24 0020)  dicyclomine  (BENTYL ) capsule 20 mg (20 mg Oral Given 11/07/24 0020)  iohexol  (OMNIPAQUE ) 300 MG/ML solution 100 mL (100 mLs Intravenous Contrast Given 11/07/24 0049)   Medical Decision Making:   Melanie Roberts is a 54 y.o. female who presented to the ED today with abdominal pain, detailed above.    Patient placed on continuous vitals and telemetry monitoring while in ED which was reviewed periodically.  Complete initial physical exam performed, notably the patient  was HDS in NAD.     Reviewed and confirmed nursing documentation for past medical history, family history, social history.    Initial Assessment:   With  the patient's presentation of abdominal pain, most likely diagnosis is nonspecific etiology. Other diagnoses were considered including (but not limited to) gastroenteritis, colitis, small bowel obstruction, appendicitis, cholecystitis, pancreatitis, nephrolithiasis, UTI, pyleonephritis. These are considered less likely due to history of present illness and physical exam findings.   This is most consistent with an acute life/limb threatening illness complicated by underlying chronic conditions.   Initial Plan:  CBC/CMP to evaluate for underlying infectious/metabolic etiology for patient's abdominal pain  Lipase to evaluate for pancreatitis  EKG to evaluate for cardiac source of pain  CTAB/Pelvis with contrast to evaluate for structural/surgical etiology of patients' severe abdominal pain.  Urinalysis and repeat physical assessment to evaluate for UTI/Pyelonpehritis  Empiric management of symptoms with escalating pain control and antiemetics as needed.   Initial Study Results:   Laboratory  All laboratory results reviewed without evidence of clinically relevant pathology.      EKG EKG was reviewed independently. Rate, rhythm, axis, intervals all examined and without medically relevant abnormality. ST segments without concerns for elevations.    Radiology All images reviewed independently. Agree with radiology report at this time.   CT ABDOMEN PELVIS W CONTRAST Result Date: 11/07/2024 EXAM: CT ABDOMEN AND PELVIS WITH CONTRAST 11/07/2024 12:56:09 AM TECHNIQUE: CT of the abdomen and pelvis was performed with the administration of 100 mL of iohexol  (OMNIPAQUE ) 300 MG/ML solution. Multiplanar reformatted images are provided for review. Automated exposure control, iterative reconstruction, and/or weight-based adjustment of the mA/kV was utilized to reduce the radiation dose to as low as reasonably achievable. COMPARISON: Ultrasound complete abdomen 12/20/2018. CLINICAL HISTORY: Abdominal pain, acute,  nonlocalized. FINDINGS: LOWER CHEST: No acute abnormality. LIVER: The liver is mildly steatotic, without mass enhancement. GALLBLADDER AND BILE DUCTS: Gallbladder is unremarkable. No biliary ductal dilatation. SPLEEN: No acute abnormality. PANCREAS: No acute abnormality. ADRENAL GLANDS: No adrenal mass. KIDNEYS, URETERS AND BLADDER: No stones in the kidneys or ureters. No hydronephrosis. No perinephric or periureteral stranding. There is no renal mass. Symmetric excretion on delayed phase images. The bladder is contracted and not well seen, but there are no inflammatory changes around it. GI AND BOWEL: Stomach demonstrates no acute abnormality. There is a small hiatal hernia. No small bowel obstruction or inflammatory change. The appendix is inflamed and swollen, measuring 1.2 cm consistent with acute appendicitis, without evidence of rupture or abscess. There is moderate retained stool in the ascending and transverse colon. No evidence of colitis or diverticulitis. PERITONEUM AND RETROPERITONEUM: No free fluid, free hemorrhage, or free air. VASCULATURE: Aorta is normal in caliber. LYMPH NODES: Multiple subcentimeter lymph nodes in the right lower quadrant mesentery are present, likely reactive. There are no enlarged nodes. REPRODUCTIVE ORGANS: No acute abnormality. Surgically absent uterus. No adnexal mass. BONES AND SOFT TISSUES: There are degenerative changes of the lumbar spine. There  are no significant osseous findings. No focal soft tissue abnormality. Multiple pelvic phleboliths are present. Small fat-containing umbilical hernia. IMPRESSION: 1. Acute appendicitis without evidence of rupture or abscess. 2. Constipation. 3. Discuss case over the phone with Dr. Jerral at 2:25 am, 11/07/24, with verbal acknowledgment of findings. Electronically signed by: Francis Quam MD 11/07/2024 02:28 AM EST RP Workstation: [id number]     Consults: Case discussed with [facility] of [facility].   Final Reassessment and Plan:    Acute appendicitis. Started on Rocephin /Flagyl  and arranged for a.m. intervention per general surgery. Pain controlled here in the emergency room.  Patient educated on plan of care.      Clinical Impression:  1. Abdominal pain, unspecified abdominal location   2. Acute appendicitis, unspecified acute appendicitis type      Transfer via Transport   Final Clinical Impression(s) / ED Diagnoses Final diagnoses:  Abdominal pain, unspecified abdominal location  Acute appendicitis, unspecified acute appendicitis type    Rx / DC Orders ED Discharge Orders     None         Jerral Meth, MD 11/07/24 [phone]  "

## 2024-11-07 NOTE — H&P (Signed)
 "    Melanie Roberts 11-07-1970  [id number].    HPI:  54 y/o F who presented to a free standing emergency room with abdominal pain. Her workup was consistent with acute appendicitis and she was transferred to [facility] for care. She arrived in stable condition.  On exam, patient is resting comfortably. NAD. Endorsing RLQ pain.   ROS: ROS  Family History  Problem Relation Age of Onset   Breast cancer Mother 50   Diabetes Mother    Fibroids Mother        uterine    Dementia Mother    Pancreatic cancer Father    Cancer Maternal Aunt        Breast   Breast cancer Maternal Aunt        late 38s   Cancer Maternal Aunt        Liver   Cancer Maternal Aunt        Throat   Heart disease Maternal Uncle        72s   Colon cancer Paternal Uncle    Clotting disorder Maternal Grandmother    Diabetes Maternal Grandmother    Liver cancer Maternal Grandfather    Diabetes Maternal Grandfather    Pancreatic cancer Paternal Grandmother    Diabetes Paternal Grandmother    Stomach cancer Paternal Grandfather    Diabetes Paternal Grandfather    Breast cancer Cousin        early 73s   Esophageal cancer Neg Hx    Sleep apnea Neg Hx     Past Medical History:  Diagnosis Date   Allergy    Diabetes mellitus without complication (HCC)    GERD (gastroesophageal reflux disease)    Headache(784.0)    migraines   Hemorrhoids 12/2012   Hypertension    Hypothyroidism    Seasonal allergies    UTI (urinary tract infection)    Wears partial dentures    upper and lower    Past Surgical History:  Procedure Laterality Date   ADENOIDECTOMY  02/17/2005   CARPAL TUNNEL RELEASE Right 04/02/2024   Procedure: CARPAL TUNNEL RELEASE;  Surgeon: Romona Harari, MD;  Location: [facility];  Service: Orthopedics;  Laterality: Right;   CESAREAN SECTION  07/08/2003   ESOPHAGOGASTRODUODENOSCOPY (EGD) WITH PROPOFOL  N/A 06/19/2019   Procedure: ESOPHAGOGASTRODUODENOSCOPY (EGD) WITH  PROPOFOL ;  Surgeon: Teressa Toribio SQUIBB, MD;  Location: [facility];  Service: Gastroenterology;  Laterality: N/A;   EUS N/A 06/19/2019   Procedure: UPPER ENDOSCOPIC ULTRASOUND (EUS) RADIAL;  Surgeon: Teressa Toribio SQUIBB, MD;  Location: [facility];  Service: Gastroenterology;  Laterality: N/A;   EVALUATION UNDER ANESTHESIA WITH HEMORRHOIDECTOMY N/A 01/14/2013   Procedure: EXAM UNDER ANESTHESIA WITH HEMORRHOIDECTOMY POSSIBLE BANDING;  Surgeon: Redell Faith, DO;  Location: [facility];  Service: General;  Laterality: N/A;   fibroids removed     LAPAROSCOPIC TOTAL HYSTERECTOMY  10/29/2006   MYOMECTOMY  12/18/2001   x 4   NASAL TURBINATE REDUCTION Bilateral 02/07/2005   inferior and removed adenoids   ROTATOR CUFF REPAIR Right 2020   TONSILLECTOMY AND ADENOIDECTOMY  age 80   TUBAL LIGATION  07/08/2003    Social History:  reports that she has never smoked. She has never used smokeless tobacco. She reports current alcohol use. She reports that she does not use drugs.  Allergies: Allergies[1]  Medications Prior to Admission  Medication Sig Dispense Refill   albuterol  (VENTOLIN  HFA) 108 (90 Base) MCG/ACT inhaler Inhale 1-2 puffs into the lungs every 6 (  six) hours as needed for wheezing or shortness of breath. 1 each 0   amlodipine -olmesartan  (AZOR ) 10-20 MG tablet Take 1 tablet by mouth daily. 90 tablet 3   atorvastatin  (LIPITOR) 20 MG tablet TAKE 1 TABLET(20 MG) BY MOUTH DAILY 90 tablet 3   beclomethasone (QVAR  REDIHALER) 80 MCG/ACT inhaler INHALE 1 PUFF INTO THE LUNGS 2 TIMES DAILY 10.6 g 1   cetirizine  (ZYRTEC ) 10 MG tablet Take 1 tablet (10 mg total) by mouth daily. 90 tablet 0   EPINEPHrine  (EPIPEN  2-PAK) 0.3 mg/0.3 mL IJ SOAJ injection Inject 0.3 mLs (0.3 mg total) into the muscle as needed (allergic reaction). 1 Device 1   ergocalciferol (VITAMIN D2) 1.25 MG (50000 UT) capsule Take 1 capsule by mouth every 7 (seven) days.     estradiol  (ESTRACE ) 0.5 MG tablet Take 1 tablet (0.5  mg total) by mouth daily. 90 tablet 3   famotidine  (PEPCID ) 20 MG tablet Take 1 tablet (20 mg total) by mouth 2 (two) times daily. 30 tablet 0   fluticasone  (FLONASE ) 50 MCG/ACT nasal spray SHAKE LIQUID AND USE 2 SPRAYS IN EACH NOSTRIL DAILY 16 g 2   furosemide  (LASIX ) 20 MG tablet TAKE 1 TABLET BY MOUTH DAILY AS NEEDED 90 tablet 2   Levothyroxine  Sodium (TIROSINT ) 200 MCG CAPS Take 200 mcg by mouth daily. 90 capsule 3   ondansetron  (ZOFRAN ) 4 MG tablet Take 1 tablet (4 mg total) by mouth every 8 (eight) hours as needed for nausea or vomiting. 15 tablet 0   OZEMPIC , 2 MG/DOSE, 8 MG/3ML SOPN Inject 2 mg into the skin once a week.     pantoprazole  (PROTONIX ) 40 MG tablet Take 1 tablet (40 mg total) by mouth 2 (two) times daily. 60 tablet 3   TIROSINT  75 MCG CAPS Take 1 capsule (75 mcg total) by mouth daily. 90 capsule 3   cyclobenzaprine  (FLEXERIL ) 10 MG tablet Take 1 tablet (10 mg total) by mouth 2 (two) times daily as needed for muscle spasms. (Patient not taking: Reported on 11/07/2024) 20 tablet 0   diclofenac  (VOLTAREN ) 75 MG EC tablet Take 1 tablet (75 mg total) by mouth 2 (two) times daily. (Patient not taking: Reported on 11/07/2024) 20 tablet 0   doxycycline  (VIBRAMYCIN ) 100 MG capsule Take 1 capsule (100 mg total) by mouth 2 (two) times daily. One po bid x 7 days (Patient not taking: Reported on 11/07/2024) 14 capsule 0   FLUoxetine  (PROZAC ) 20 MG capsule Take 1 capsule (20 mg total) by mouth daily. (Patient not taking: Reported on 11/07/2024) 90 capsule 3   Lancets (ONETOUCH DELICA PLUS LANCET30G) MISC daily. as directed     ONETOUCH VERIO test strip daily.     predniSONE  (DELTASONE ) 10 MG tablet Take 2 tablets (20 mg total) by mouth 2 (two) times daily with a meal. (Patient not taking: Reported on 11/07/2024) 20 tablet 0    Physical Exam: Blood pressure 115/87, pulse 81, temperature 98.5 F (36.9 C), resp. rate 16, height 5' 7 (1.702 m), weight 98.4 kg, SpO2 95%. Gen: female, NAD Abd:  soft, non-distended, TTP in the RLQ, no rebound/guarding  Results for orders placed or performed during the hospital encounter of 11/06/24 (from the past 48 hours)  Lipase, blood     Status: None   Collection Time: 11/07/24 12:03 AM  Result Value Ref Range   Lipase 26 11 - 51 U/L    Comment: Performed at [facility], [address], [place], [place] [address]  Comprehensive metabolic panel  Status: Abnormal   Collection Time: 11/07/24 12:03 AM  Result Value Ref Range   Sodium 138 135 - 145 mmol/L   Potassium 3.9 3.5 - 5.1 mmol/L   Chloride 100 98 - 111 mmol/L   CO2 25 22 - 32 mmol/L   Glucose, Bld 117 (H) 70 - 99 mg/dL    Comment: Glucose reference range applies only to samples taken after fasting for at least 8 hours.   BUN 11 6 - 20 mg/dL   Creatinine, Ser 9.28 0.44 - 1.00 mg/dL   Calcium  10.5 (H) 8.9 - 10.3 mg/dL   Total Protein 7.7 6.5 - 8.1 g/dL   Albumin 4.6 3.5 - 5.0 g/dL   AST 13 (L) 15 - 41 U/L   ALT 11 0 - 44 U/L   Alkaline Phosphatase 49 38 - 126 U/L   Total Bilirubin 0.7 0.0 - 1.2 mg/dL   GFR, Estimated >39 >39 mL/min    Comment: (NOTE) Calculated using the CKD-EPI Creatinine Equation (2021)    Anion gap 13 5 - 15    Comment: Performed at [facility], [address], [place], [place] [address]  CBC     Status: None   Collection Time: 11/07/24 12:03 AM  Result Value Ref Range   WBC 9.9 4.0 - 10.5 K/uL   RBC 4.41 3.87 - 5.11 MIL/uL   Hemoglobin 12.5 12.0 - 15.0 g/dL   HCT 62.2 63.9 - 53.9 %   MCV 85.5 80.0 - 100.0 fL   MCH 28.3 26.0 - 34.0 pg   MCHC 33.2 30.0 - 36.0 g/dL   RDW 85.7 88.4 - 84.4 %   Platelets 313 150 - 400 K/uL   nRBC 0.0 0.0 - 0.2 %    Comment: Performed at [facility], [address], [place], [place] [address]  Urinalysis, Routine w reflex microscopic -Urine, Clean Catch     Status: None   Collection Time: 11/07/24 12:25 AM  Result Value Ref Range   Color, Urine YELLOW YELLOW    APPearance CLEAR CLEAR   Specific Gravity, Urine 1.024 1.005 - 1.030   pH 6.0 5.0 - 8.0   Glucose, UA NEGATIVE NEGATIVE mg/dL   Hgb urine dipstick NEGATIVE NEGATIVE   Bilirubin Urine NEGATIVE NEGATIVE   Ketones, ur NEGATIVE NEGATIVE mg/dL   Protein, ur NEGATIVE NEGATIVE mg/dL   Nitrite NEGATIVE NEGATIVE   Leukocytes,Ua NEGATIVE NEGATIVE    Comment: Performed at [facility], [address] [place], [place] [address]  Glucose, capillary     Status: None   Collection Time: 11/07/24  2:13 PM  Result Value Ref Range   Glucose-Capillary 99 70 - 99 mg/dL    Comment: Glucose reference range applies only to samples taken after fasting for at least 8 hours.   CT ABDOMEN PELVIS W CONTRAST Result Date: 11/07/2024 EXAM: CT ABDOMEN AND PELVIS WITH CONTRAST 11/07/2024 12:56:09 AM TECHNIQUE: CT of the abdomen and pelvis was performed with the administration of 100 mL of iohexol  (OMNIPAQUE ) 300 MG/ML solution. Multiplanar reformatted images are provided for review. Automated exposure control, iterative reconstruction, and/or weight-based adjustment of the mA/kV was utilized to reduce the radiation dose to as low as reasonably achievable. COMPARISON: Ultrasound complete abdomen 12/20/2018. CLINICAL HISTORY: Abdominal pain, acute, nonlocalized. FINDINGS: LOWER CHEST: No acute abnormality. LIVER: The liver is mildly steatotic, without mass enhancement. GALLBLADDER AND BILE DUCTS: Gallbladder is unremarkable. No biliary ductal dilatation. SPLEEN: No acute abnormality. PANCREAS: No acute abnormality. ADRENAL GLANDS: No adrenal mass. KIDNEYS,  URETERS AND BLADDER: No stones in the kidneys or ureters. No hydronephrosis. No perinephric or periureteral stranding. There is no renal mass. Symmetric excretion on delayed phase images. The bladder is contracted and not well seen, but there are no inflammatory changes around it. GI AND BOWEL: Stomach demonstrates no acute abnormality. There is a small  hiatal hernia. No small bowel obstruction or inflammatory change. The appendix is inflamed and swollen, measuring 1.2 cm consistent with acute appendicitis, without evidence of rupture or abscess. There is moderate retained stool in the ascending and transverse colon. No evidence of colitis or diverticulitis. PERITONEUM AND RETROPERITONEUM: No free fluid, free hemorrhage, or free air. VASCULATURE: Aorta is normal in caliber. LYMPH NODES: Multiple subcentimeter lymph nodes in the right lower quadrant mesentery are present, likely reactive. There are no enlarged nodes. REPRODUCTIVE ORGANS: No acute abnormality. Surgically absent uterus. No adnexal mass. BONES AND SOFT TISSUES: There are degenerative changes of the lumbar spine. There are no significant osseous findings. No focal soft tissue abnormality. Multiple pelvic phleboliths are present. Small fat-containing umbilical hernia. IMPRESSION: 1. Acute appendicitis without evidence of rupture or abscess. 2. Constipation. 3. Discuss case over the phone with Dr. Jerral at 2:25 am, 11/07/24, with verbal acknowledgment of findings. Electronically signed by: Francis Quam MD 11/07/2024 02:28 AM EST RP Workstation: [id number]    Assessment/Plan 54 y/o F with acute appendicitis  - Will proceed to the OR. We discussed the alternatives and potential risks of surgery, including but not limited to: bleeding, infection, damage to the bowel or surrounding structures, need for more extensive surgery, and need for additional procedures. All questions were addressed and consent was obtained.    Cordella DELENA Polly Marlis [facility]on [facility] 11/07/2024, 3:20 PM Please see Amion for pager number during day hours 7:00am-4:30pm or 7:00am -11:30am on weekends     [1]  Allergies Allergen Reactions   Ciprofloxacin Anaphylaxis   Quinolones Anaphylaxis   Bee Venom Swelling and Rash   "

## 2024-11-07 NOTE — Anesthesia Preprocedure Evaluation (Signed)
"                                    Anesthesia Evaluation  Patient identified by MRN, date of birth, ID band Patient awake    Reviewed: Allergy & Precautions, NPO status , Patient's Chart, lab work & pertinent test results  Airway Mallampati: II  TM Distance: >3 FB Neck ROM: Full    Dental  (+) Dental Advisory Given, Partial Upper, Partial Lower   Pulmonary neg pulmonary ROS   Pulmonary exam normal breath sounds clear to auscultation       Cardiovascular hypertension, Pt. on medications Normal cardiovascular exam Rhythm:Regular Rate:Normal     Neuro/Psych  Headaches PSYCHIATRIC DISORDERS  Depression       GI/Hepatic Neg liver ROS,GERD  Medicated,, Appendicitis   Endo/Other  diabetes, Type 2Hypothyroidism  Obesity   Renal/GU negative Renal ROS     Musculoskeletal  (+) Arthritis , Osteoarthritis,    Abdominal   Peds  Hematology negative hematology ROS (+)   Anesthesia Other Findings Day of surgery medications reviewed with the patient.  Reproductive/Obstetrics                              Anesthesia Physical Anesthesia Plan  ASA: 2  Anesthesia Plan: General   Post-op Pain Management: Tylenol  PO (pre-op)* and Toradol  IV (intra-op)*   Induction: Intravenous  PONV Risk Score and Plan: 4 or greater and Midazolam , Dexamethasone , Ondansetron  and Scopolamine  patch - Pre-op  Airway Management Planned: Oral ETT  Additional Equipment:   Intra-op Plan:   Post-operative Plan: Extubation in OR  Informed Consent: I have reviewed the patients History and Physical, chart, labs and discussed the procedure including the risks, benefits and alternatives for the proposed anesthesia with the patient or authorized representative who has indicated his/her understanding and acceptance.     Dental advisory given  Plan Discussed with: CRNA  Anesthesia Plan Comments:         Anesthesia Quick Evaluation  "

## 2024-11-07 NOTE — Transfer of Care (Signed)
 Immediate Anesthesia Transfer of Care Note  Patient: Melanie Roberts  Procedure(s) Performed: APPENDECTOMY, LAPAROSCOPIC  Patient Location: PACU  Anesthesia Type:General  Level of Consciousness: awake and alert   Airway & Oxygen Therapy: Patient Spontanous Breathing and Patient connected to nasal cannula oxygen  Post-op Assessment: Report given to RN and Post -op Vital signs reviewed and stable  Post vital signs: Reviewed and stable  Last Vitals:  Vitals Value Taken Time  BP 111/82 11/07/24 17:11  Temp    Pulse 82 11/07/24 17:12  Resp 20 11/07/24 17:12  SpO2 92 % 11/07/24 17:12  Vitals shown include unfiled device data.  Last Pain:  Vitals:   11/07/24 1410  TempSrc:   PainSc: 7          Complications: No notable events documented.

## 2024-11-07 NOTE — Anesthesia Procedure Notes (Signed)
 Procedure Name: Intubation Date/Time: 11/07/2024 3:50 PM  Performed by: Zelphia Norleen HERO, CRNAPre-anesthesia Checklist: Patient identified, Emergency Drugs available, Suction available and Patient being monitored Patient Re-evaluated:Patient Re-evaluated prior to induction Oxygen Delivery Method: Circle system utilized Preoxygenation: Pre-oxygenation with 100% oxygen Induction Type: IV induction Ventilation: Mask ventilation without difficulty Laryngoscope Size: Mac and 4 Grade View: Grade I Tube type: Oral Tube size: 7.0 mm Number of attempts: 1 Airway Equipment and Method: Stylet Placement Confirmation: ETT inserted through vocal cords under direct vision, positive ETCO2 and breath sounds checked- equal and bilateral Secured at: 22 cm Tube secured with: Tape Dental Injury: Teeth and Oropharynx as per pre-operative assessment

## 2024-11-07 NOTE — Discharge Instructions (Addendum)
 Outpatient Surgery Home Care Instruction  Activity  The effects of anesthesia are still present and drowsiness may result.  Limit activity for the first 24 hours, then you may return to normal daily activities. Returning to normal daily activities as soon as you can following surgery will enhance recovery time.  Do not drive or operate heavy machinery within 24 hours of taking narcotic pain medications.   Do not mow the lawn, use a vacuum cleaner, or do any other strenuous activities without first consulting your surgical team.   Diet Drink plenty of fluids and eat light meals today, then resume regular diet. Some patients may find their appetite is poor for a week or two after surgery. This is a normal result of the stress of surgery-your appetite will return in time.   There are no specific diet restrictions after surgery.   Dressing and Wound Care  Keep your wound or incision site clean and dry.  You have Dermabond/Durabond (skin glue) on your incisions: This will usually remain in place for 10-14 days, then naturally fall off your skin. You may take a shower 24 hrs after surgery, carefully wash, not scrub the incision site with a mild non-scented soap. Pat dry with a soft towel.  Do not pick or peel skin glue off.   You can shower and let the water fall on the dressings above. Do not soak or submerge your incision(s) in a bath tub, hot tub, or swimming pool, until your doctor says it is ok to do so or the incision(s) have completely healed, usually about 2-4 weeks.  Do not use creams, powder, salves or balms on your incision(s).  What to Expect After Surgery   Moderate discomfort controlled with medications  Minimal drainage from incision  Feeling fatigue and weak  Constipation after surgery is common. Drink plenty fluids and eat a high fiber diet.   Pain Control: Prescribed Non-Narcotic Pain Medication  You will be given three prescriptions.  Two of them will be for prescription  strength ibuprofen  (i.e. Advil ) and prescription strength acetaminophen  (i.e. Tylenol ).  The vast majority of patients will just need these two medications.  One prescription will be for a 'rescue' prescription of an oral narcotic (oxycodone ).  You may fill this if needed.  You will alternate taking the ibuprofen  (600mg ) every 6 hours and also the acetaminophen  (650mg ) every 6 hours so that you are taking one of those medications every 3 hours.  For example: o 0800 - take ibuprofen  600mg  o 1100 - take acetaminophen  650mg  o 1400 - take ibuprofen  600mg  o 1700 - take acetaminophen  650mg  o Etc  Continue taking this alternating pattern of ibuprofen  and acetaminophen  for 3 days  If you cannot take one or the other of these medications, just take the one you can every 6 hours.  If you are comfortable at night, you don't have to wake up and take a medication.  If you are still uncomfortable after taking either ibuprofen  or acetaminophen , try gentle stretching exercise and ice packs (a bag of frozen vegetables works great).  If you are still uncomfortable, you may fill the narcotic prescription of Oxycodone  and take as directed.  Once you have completed these prescriptions, your pain level should be low enough to stop taking medications altogether or just use an over the counter medication (ibuprofen  or acetaminophen ) as needed.    Pain Control: Over the Counter Medications to take as needed  Colace/Docusate: May be prescribed by your surgeon to prevent constipation  caused by the combination of narcotics, effects of anesthesia, and decreased ambulation.  Hold for loose stools or diarrhea. Take 100 mg 1-2 times a day starting tonight.   Fiber: High fiber foods, extra liquids (water 9-13 cups/day) can also assist with constipation. Examples of high fiber foods are fruit, bran. Prune juice and water are also good liquids to drink.  Milk of Magnesia/Miralax:  If constipated despite takeing the over the counter  stool softeners, you may take Milk of Magnesia or Miralax as directed on bottle to assist with constipation.     Pepcid /Famotidine : May be prescribed while taking naproxen  (Aleve ) or other NSAIDs such as ibuprofen  (Motrin /Advil ) to prevent stomach upset or Acid-reflux symptoms. Take 1 tablet 1-2 times a day.   **Constipation: The first bowel movement may occur anywhere between 1-5 days after surgery.  As long as you are not nauseated or not having significant abdominal pain this variation is acceptable. Narcotic pain medications can cause constipation increasing discomfort; early discontinuation will assist with bowel management. If constipated despite taking stool softeners, you may take Milk of Magnesia or Miralax as directed on the bottle.     **Home medications: You may restart your home medications as directed by your respective Primary Care Physician or Surgeon.   When to notify your Doctor or Healthcare Team   Sign of Wound Infection   Fever over 100 degrees.  Wound becomes extremely swollen, shows red streaks, warm to the touch, and/or drainage from the incision site or foul-smelling drainage.  Wound edges separate or opens up  Bleeding or bruising   If you have bleeding, apply pressure to the site and hold the pressure firmly for 5 minutes. If the bleeding continues, apply pressure again and call 911. If the bleeding stopped, call your doctor to report it.   Call your doctor or nurse if you have increased bleeding from your site and increased bruising or a lump forms or gets larger under your skin at the site. Unrelieved Pain   Call your doctor or nurse if your pain gets worse or is not eased 1 hour after taking your pain medicine, or if it is severe and uncontrolled. Nausea and Vomiting   Call your doctor or nurse if you have nausea and vomiting that continues more than 24 hours, will not let you keep medicine down and will not let you keep fluids down  Fever, Flu-like symptoms   Fever  over 100 degrees and/or chills  Gastrointestinal Bleeding Symptoms    Black tarry bowel movements.  This can be normal after surgery on the stomach, but should resolve in a day or two.    Call 911 if you suddenly have signs of blood loss such as:  Vomiting blood  Fast heart rate  Feeling faint, sweaty, or blacking out  Passing bright red blood from your rectum  Blood Clot Symptoms   Tender, swollen or reddened areas in your calf muscle or thighs.  Numbness or tingling in your lower leg or calf, or at the top of your leg or groin  Skin on your leg looks pale or blue or feels cold to touch  Chest pain or have trouble breathing, lightheadedness, fast heart rate  Sudden Onset of Symptoms    Call 911 if you suddenly have:  Leg weakness and spasm  Loss of bladder or bowel function  Seizure  Confusion, severe headache, dizziness or feeling unsteady, problems talking, difficulty swallowing, and/or numbness or muscle weakness as these could be signs of  a stroke.  Follow up Appointment Your follow up appointment should be scheduled 2-3 weeks after your surgery date.  If you have not previously scheduled for a follow-up visit you can be scheduled by contacting [phone].

## 2024-11-07 NOTE — Progress Notes (Signed)
 [facility] was called and informed that the patient can come to [facility] pre-op department, per OR.

## 2024-11-07 NOTE — Op Note (Signed)
 Preoperative diagnosis: acute appendicitis   Postoperative diagnosis: Same   Procedure: laparoscopic appendectomy  Surgeon: Cordella Idler, M.D.  Asst: None  Anesthesia: General endotracheal  Indications for procedure: Melanie Roberts is a 54 y.o. female who presented to the ER with abdominal pain. Her workup was consistent with appendicitis, and I offered her an appendectomy. The patient's questions were answered and the patient elected to proceed with the planned procedure.  Description of procedure: The patient was brought into the operating room, placed in the supine position. General endotracheal anesthesia was administered with endotracheal tube. The patient's left arm was tucked. All pressure points were offloaded by foam padding. The patient was prepped and draped in the usual sterile fashion.  A veress needle was inserted into the abdomen in the LUQ at Palmer's point. After verifying correct placement with aspiration and a saline drop test, the abdomen was insufflated to .  A 5-mm trocar was inserted into the abdomen just left of the umbilicus using an opti-view technique.  The laparoscope was introduced and the abdominal contents inspected to ensure that there were no signs of injury.  An additional 5-mm port was placed in the suprapubic position and a 12-mm port was placed in the LLQ under direct visualization.  All trocars sites were first anesthesized with 0.25% marcaine  with epinephrine . Next the patient was placed in trendelenberg, rotated to the left. The omentum was retracted cephalad. The cecum and appendix were identified. The appendix was inflamed and wrapped on itself. There was no evidence of perforation. The mesoappendix was dissected toward the base of the appendix using a bipolar cautery device.  The base of the appendix appeared healthy.  A 45mm white load stapler was used to transect the appendix at the base.  The appendix was placed in a specimen bag.The  appendix was removed via the LLQ incision. 0 vicryl was used to close the fascial defect from the 12-mm port. All trocars were removed. All incisions were closed with 4-0 monocryl. The patient woke from anesthesia and was brought to PACU in stable condition.  Findings: Inflamed appendix folded on itself.   Specimen: appendix for permanent  Blood loss: 10mL  Local anesthesia: 30mL 0.25% marcaine  w/ epinephrine   Complications: None  Cordella Idler, M.D. General Surgery [facility], [place]

## 2024-11-07 NOTE — ED Notes (Signed)
 Called [facility] OR to check on patient status and was told the surgery was pushed back from 11 to 3 pm today.

## 2024-11-10 ENCOUNTER — Encounter (HOSPITAL_COMMUNITY): Payer: Self-pay | Admitting: General Surgery

## 2024-11-11 NOTE — Anesthesia Postprocedure Evaluation (Signed)
"   Anesthesia Post Note  Patient: Melanie Roberts  Procedure(s) Performed: APPENDECTOMY, LAPAROSCOPIC     Patient location during evaluation: PACU Anesthesia Type: General Level of consciousness: awake and alert, oriented and patient cooperative Pain management: pain level controlled Vital Signs Assessment: post-procedure vital signs reviewed and stable Respiratory status: spontaneous breathing, nonlabored ventilation and respiratory function stable Cardiovascular status: blood pressure returned to baseline and stable Postop Assessment: no apparent nausea or vomiting Anesthetic complications: no   No notable events documented.  Last Vitals:  Vitals:   11/07/24 1800 11/07/24 1815  BP: 106/70 100/73  Pulse: 76 75  Resp: 12 15  Temp:    SpO2: 98% 91%    Last Pain:  Vitals:   11/07/24 1815  TempSrc:   PainSc: 0-No pain                 Almarie HERO Selenne Coggin      "

## 2024-11-12 LAB — SURGICAL PATHOLOGY
